# Patient Record
Sex: Female | Born: 1935
Health system: Southern US, Community
[De-identification: ages and names within clinical notes are randomized; demographics above are authoritative.]

## PROBLEM LIST (undated history)

## (undated) DIAGNOSIS — F329 Major depressive disorder, single episode, unspecified: Secondary | ICD-10-CM

## (undated) DIAGNOSIS — E079 Disorder of thyroid, unspecified: Secondary | ICD-10-CM

## (undated) DIAGNOSIS — E785 Hyperlipidemia, unspecified: Secondary | ICD-10-CM

## (undated) DIAGNOSIS — N390 Urinary tract infection, site not specified: Secondary | ICD-10-CM

## (undated) DIAGNOSIS — F32A Depression, unspecified: Secondary | ICD-10-CM

## (undated) DIAGNOSIS — I10 Essential (primary) hypertension: Secondary | ICD-10-CM

## (undated) DIAGNOSIS — A0472 Enterocolitis due to Clostridium difficile, not specified as recurrent: Secondary | ICD-10-CM

## (undated) DIAGNOSIS — M810 Age-related osteoporosis without current pathological fracture: Secondary | ICD-10-CM

## (undated) HISTORY — PX: FOOT SURGERY: SHX648

## (undated) HISTORY — PX: OTHER SURGICAL HISTORY: SHX169

## (undated) HISTORY — DX: Disorder of thyroid, unspecified: E07.9

## (undated) HISTORY — DX: Age-related osteoporosis without current pathological fracture: M81.0

## (undated) HISTORY — DX: Major depressive disorder, single episode, unspecified: F32.9

## (undated) HISTORY — DX: Depression, unspecified: F32.A

## (undated) HISTORY — DX: Essential (primary) hypertension: I10

---

## 1999-09-03 ENCOUNTER — Encounter: Admission: RE | Admit: 1999-09-03 | Discharge: 1999-09-03 | Payer: Self-pay

## 1999-09-07 ENCOUNTER — Emergency Department (HOSPITAL_COMMUNITY): Admission: EM | Admit: 1999-09-07 | Discharge: 1999-09-07 | Payer: Self-pay | Admitting: Emergency Medicine

## 1999-09-08 ENCOUNTER — Encounter: Payer: Self-pay | Admitting: Emergency Medicine

## 1999-11-26 ENCOUNTER — Encounter: Payer: Self-pay | Admitting: Neurological Surgery

## 1999-11-30 ENCOUNTER — Inpatient Hospital Stay (HOSPITAL_COMMUNITY): Admission: RE | Admit: 1999-11-30 | Discharge: 1999-12-04 | Payer: Self-pay | Admitting: Neurological Surgery

## 1999-11-30 ENCOUNTER — Encounter: Payer: Self-pay | Admitting: Neurological Surgery

## 1999-12-01 ENCOUNTER — Encounter: Payer: Self-pay | Admitting: Neurological Surgery

## 1999-12-18 ENCOUNTER — Encounter: Admission: RE | Admit: 1999-12-18 | Discharge: 1999-12-18 | Payer: Self-pay | Admitting: Neurological Surgery

## 1999-12-18 ENCOUNTER — Encounter: Payer: Self-pay | Admitting: Neurological Surgery

## 1999-12-28 ENCOUNTER — Encounter: Admission: RE | Admit: 1999-12-28 | Discharge: 2000-01-20 | Payer: Self-pay | Admitting: Orthopaedic Surgery

## 2000-02-01 ENCOUNTER — Encounter: Admission: RE | Admit: 2000-02-01 | Discharge: 2000-02-01 | Payer: Self-pay | Admitting: Neurological Surgery

## 2000-02-01 ENCOUNTER — Encounter: Payer: Self-pay | Admitting: Neurological Surgery

## 2000-03-18 ENCOUNTER — Encounter: Payer: Self-pay | Admitting: Neurological Surgery

## 2000-03-18 ENCOUNTER — Encounter: Admission: RE | Admit: 2000-03-18 | Discharge: 2000-03-18 | Payer: Self-pay | Admitting: Neurological Surgery

## 2000-03-30 ENCOUNTER — Ambulatory Visit (HOSPITAL_BASED_OUTPATIENT_CLINIC_OR_DEPARTMENT_OTHER): Admission: RE | Admit: 2000-03-30 | Discharge: 2000-03-30 | Payer: Self-pay | Admitting: Orthopedic Surgery

## 2000-04-19 ENCOUNTER — Other Ambulatory Visit: Admission: RE | Admit: 2000-04-19 | Discharge: 2000-04-19 | Payer: Self-pay | Admitting: Gynecology

## 2001-06-01 ENCOUNTER — Encounter: Admission: RE | Admit: 2001-06-01 | Discharge: 2001-06-01 | Payer: Self-pay | Admitting: Neurological Surgery

## 2001-06-01 ENCOUNTER — Encounter: Payer: Self-pay | Admitting: Neurological Surgery

## 2001-06-07 ENCOUNTER — Encounter: Admission: RE | Admit: 2001-06-07 | Discharge: 2001-06-07 | Payer: Self-pay | Admitting: Neurological Surgery

## 2001-06-07 ENCOUNTER — Encounter: Payer: Self-pay | Admitting: Neurological Surgery

## 2001-06-20 ENCOUNTER — Other Ambulatory Visit: Admission: RE | Admit: 2001-06-20 | Discharge: 2001-06-20 | Payer: Self-pay | Admitting: Gynecology

## 2001-06-23 ENCOUNTER — Encounter: Payer: Self-pay | Admitting: General Surgery

## 2001-06-23 ENCOUNTER — Encounter: Admission: RE | Admit: 2001-06-23 | Discharge: 2001-06-23 | Payer: Self-pay | Admitting: General Surgery

## 2002-04-09 ENCOUNTER — Encounter: Payer: Self-pay | Admitting: Internal Medicine

## 2002-04-09 ENCOUNTER — Encounter: Admission: RE | Admit: 2002-04-09 | Discharge: 2002-04-09 | Payer: Self-pay | Admitting: Internal Medicine

## 2002-06-14 ENCOUNTER — Ambulatory Visit (HOSPITAL_COMMUNITY): Admission: RE | Admit: 2002-06-14 | Discharge: 2002-06-14 | Payer: Self-pay | Admitting: Gastroenterology

## 2003-01-18 ENCOUNTER — Encounter: Payer: Self-pay | Admitting: Geriatric Medicine

## 2003-01-18 ENCOUNTER — Encounter: Admission: RE | Admit: 2003-01-18 | Discharge: 2003-01-18 | Payer: Self-pay | Admitting: Geriatric Medicine

## 2003-01-28 ENCOUNTER — Ambulatory Visit (HOSPITAL_COMMUNITY): Admission: RE | Admit: 2003-01-28 | Discharge: 2003-01-28 | Payer: Self-pay | Admitting: Neurology

## 2003-01-28 ENCOUNTER — Encounter: Payer: Self-pay | Admitting: Neurology

## 2003-01-30 ENCOUNTER — Other Ambulatory Visit: Admission: RE | Admit: 2003-01-30 | Discharge: 2003-01-30 | Payer: Self-pay | Admitting: Gynecology

## 2003-03-20 ENCOUNTER — Encounter: Payer: Self-pay | Admitting: Neurological Surgery

## 2003-03-20 ENCOUNTER — Ambulatory Visit (HOSPITAL_COMMUNITY): Admission: RE | Admit: 2003-03-20 | Discharge: 2003-03-20 | Payer: Self-pay | Admitting: Neurological Surgery

## 2003-04-03 ENCOUNTER — Encounter: Admission: RE | Admit: 2003-04-03 | Discharge: 2003-04-03 | Payer: Self-pay | Admitting: Neurological Surgery

## 2003-04-03 ENCOUNTER — Encounter: Payer: Self-pay | Admitting: Neurological Surgery

## 2003-06-07 ENCOUNTER — Encounter: Admission: RE | Admit: 2003-06-07 | Discharge: 2003-06-07 | Payer: Self-pay | Admitting: Geriatric Medicine

## 2003-06-07 ENCOUNTER — Encounter: Payer: Self-pay | Admitting: Geriatric Medicine

## 2003-11-05 ENCOUNTER — Encounter: Admission: RE | Admit: 2003-11-05 | Discharge: 2003-11-05 | Payer: Self-pay | Admitting: Neurological Surgery

## 2004-04-15 ENCOUNTER — Other Ambulatory Visit: Admission: RE | Admit: 2004-04-15 | Discharge: 2004-04-15 | Payer: Self-pay | Admitting: Gynecology

## 2005-10-04 ENCOUNTER — Encounter: Admission: RE | Admit: 2005-10-04 | Discharge: 2005-10-04 | Payer: Self-pay | Admitting: Neurological Surgery

## 2005-10-21 ENCOUNTER — Encounter: Admission: RE | Admit: 2005-10-21 | Discharge: 2005-10-21 | Payer: Self-pay | Admitting: Neurological Surgery

## 2006-04-19 ENCOUNTER — Encounter: Admission: RE | Admit: 2006-04-19 | Discharge: 2006-04-19 | Payer: Self-pay | Admitting: Geriatric Medicine

## 2006-09-08 ENCOUNTER — Encounter: Admission: RE | Admit: 2006-09-08 | Discharge: 2006-09-08 | Payer: Self-pay | Admitting: Neurological Surgery

## 2006-11-24 ENCOUNTER — Other Ambulatory Visit: Admission: RE | Admit: 2006-11-24 | Discharge: 2006-11-24 | Payer: Self-pay | Admitting: Gynecology

## 2007-05-17 ENCOUNTER — Encounter: Admission: RE | Admit: 2007-05-17 | Discharge: 2007-05-17 | Payer: Self-pay | Admitting: Gynecology

## 2008-01-25 ENCOUNTER — Ambulatory Visit (HOSPITAL_COMMUNITY): Admission: RE | Admit: 2008-01-25 | Discharge: 2008-01-25 | Payer: Self-pay | Admitting: Geriatric Medicine

## 2008-01-25 ENCOUNTER — Encounter (INDEPENDENT_AMBULATORY_CARE_PROVIDER_SITE_OTHER): Payer: Self-pay | Admitting: Geriatric Medicine

## 2008-01-25 ENCOUNTER — Ambulatory Visit: Payer: Self-pay | Admitting: Vascular Surgery

## 2008-05-20 ENCOUNTER — Encounter: Admission: RE | Admit: 2008-05-20 | Discharge: 2008-05-20 | Payer: Self-pay | Admitting: Gynecology

## 2008-06-11 ENCOUNTER — Encounter: Admission: RE | Admit: 2008-06-11 | Discharge: 2008-06-11 | Payer: Self-pay | Admitting: Neurological Surgery

## 2009-06-02 ENCOUNTER — Encounter: Admission: RE | Admit: 2009-06-02 | Discharge: 2009-06-02 | Payer: Self-pay | Admitting: Geriatric Medicine

## 2009-08-07 ENCOUNTER — Ambulatory Visit (HOSPITAL_COMMUNITY): Admission: RE | Admit: 2009-08-07 | Discharge: 2009-08-07 | Payer: Self-pay | Admitting: Gastroenterology

## 2009-09-19 ENCOUNTER — Encounter: Admission: RE | Admit: 2009-09-19 | Discharge: 2009-09-19 | Payer: Self-pay | Admitting: Gastroenterology

## 2010-06-15 ENCOUNTER — Encounter: Admission: RE | Admit: 2010-06-15 | Discharge: 2010-06-15 | Payer: Self-pay | Admitting: Geriatric Medicine

## 2010-09-20 ENCOUNTER — Encounter: Payer: Self-pay | Admitting: Geriatric Medicine

## 2010-09-20 ENCOUNTER — Encounter: Payer: Self-pay | Admitting: Gastroenterology

## 2011-01-15 NOTE — Discharge Summary (Signed)
[place]. [facility]  Patient:    Jacqueline Robles, Jacqueline Robles                         MRN: [id number] Adm. Date:  [id number] Disc. Date: [id number] Attending:  Jonne Ply                           Discharge Summary  ADMITTING DIAGNOSIS:  Cervical spondylosis with radiculopathy.  DISCHARGE DIAGNOSIS:  Cervical spondylosis with radiculopathy.  OPERATION:  Anterior cervical diskectomy and arthrodesis, C4-5, C5-6, and C6-7 ith allograft Synthes plate fixation.  CONDITION ON DISCHARGE:  Improving.  HOSPITAL COURSE:  The patient is a 77 year old individual who has had significant neck shoulder and right arm pain.  She was found to have spondylitic disease at  C4-5, 5-6, and 6-7 with significant foraminal encroachment at the levels involved. She had failed extensive efforts at conservative management and was advised regarding surgical treatment which was performed on November 30, 1999. Postoperatively, the patient complained of significant right shoulder pain. She was ambulated albeit slowly.  She was noted to be very dizzy and light-headed when she would ambulate.  On the third postoperative day, she was found to have a hemoglobin of 8.4.  She was advised regarding transfusion of two units of packed red cells which was performed on December 03, 1999.  She tolerated this well.  She continues to have right shoulder pain which persists.  An MRI of the right shoulder was performed which shows significant tendinitis.  Consultation with Dr. Norlene Campbell will be obtained prior to the patients discharge, if possible.  In the meantime, she is maintained on her current pain regimen which includes hydrocodone and OxyContin 10 mg p.o. b.i.d.  She will be seen in the office in three weeks time. DD:  12/03/99 TD:  12/06/99 Job: [id number]

## 2011-01-15 NOTE — Op Note (Signed)
[facility]  Patient:    Jacqueline Robles, Jacqueline Robles                         MRN: [id number] Proc. Date: 03/30/00 Adm. Date:  [id number] Disc. Date: [id number] Attending:  Georgena Spurling                           Operative Report  PREOPERATIVE DIAGNOSIS:  Right shoulder impingement syndrome with tendonosis of the rotator cuff.  POSTOPERATIVE DIAGNOSIS:  Right shoulder impingement syndrome with tendonosis of the rotator cuff.  PROCEDURE PERFORMED:  Right shoulder arthroscopy with subacromial decompression.  SURGEON:  Littie Deeds, M.D.  ANESTHESIA:  Interscalene block.  BRIEF HISTORY:  The patient is a 77 year old white female with shoulder impingement and failed conservative treatment.  After informed consent was obtained, she was taken to the operating room.  DESCRIPTION OF PROCEDURE:  The patient was taken to the operating room and laid in the supine position after being administered an interscalene block in the preoperative holding area.  The right shoulder and upper extremity was prepped and draped in the usual sterile fashion with the patient in the beach chair position.  A posterior portal was created 2 cm medial and inferior to the posterolateral corner of the acromion.  This was done with a #11 blade, blunt trocar and cannula.  A diagnostic glenohumeral arthroscopy was performed showing some mild chondromalacia, but no other abnormalities.  There was some fraying of the subscapularis tendon.  We then evacuated the glenohumeral joint and went into the subacromial space to the posterior portal.  We then made a lateral portal 2 cm lateral to the border of the acromion in line with the posterior aspect of the clavicle.  The 7 mm cannula was introduced to the subacromial space.  The 42 shaver was used and a total subacromial bursectomy was performed.  We then used the cylindrical bur to do an acromioplasty removing approximately 5-7 mm of bone starting at  the anterolateral corner converting a type 3 to a type 1 acromion.  We went over to the [facility] joint, but did not violate the [facility] joint.  We then debrided further the bursa off of the rotator cuff and went through extreme of motion to ensure that there was no rotator cuff tears.  There were not.  There did appear to be some possible partial thickness tearing at the rotator cuff interval, but not full thickness.  We then evacuated the subacromial space with fluid in the instruments.  We then infiltrated the joints with 10 cc of 1/2% Marcaine and morphine mixture.  We then placed the Zimmer pain pump through the lateral portal and closed the portals with interrupted 4-0 nylon sutures.  We then dressed the wounds with Xeroform dressings, sponges, and a standard shoulder dressing.  We then placed the patient in a sling.  The patient was taken to the recovery room in stable condition.  Complications none.  Drains none. DD:  03/30/00 TD:  04/01/00 Job: [id number]

## 2011-01-15 NOTE — Op Note (Signed)
   NAME:  Jacqueline Robles, Jacqueline Robles                            ACCOUNT NO.:  [id number]   MEDICAL RECORD NO.:  [id number]                   PATIENT TYPE:  AMB   LOCATION:  ENDO                                 FACILITY:  [facility]   PHYSICIAN:  Danise Edge, M.D.                DATE OF BIRTH:  19-Jul-1922   DATE OF PROCEDURE:  06/14/2002  DATE OF DISCHARGE:                                 OPERATIVE REPORT   PROCEDURE:  Screening colonoscopy.   INDICATIONS:  The patient is a 77 year old female born [birth year].  The  patient is scheduled to undergo her first screening colonoscopy with  polypectomy to prevent colon cancer.  I discussed with the patient the  complications associated with colonoscopy and polypectomy, including a 15  per thousand risk of bleeding and four per thousand risk of colon  perforation requiring surgical repair.  The patient has signed the operative  permit.   ENDOSCOPIST:  Danise Edge, M.D.   PREMEDICATION:  Versed 5 mg, fentanyl 50 mcg.   ENDOSCOPE:  Olympus pediatric colonoscope.   DESCRIPTION OF PROCEDURE:  After obtaining informed consent, the patient was  placed in the left lateral decubitus position.  I administered intravenous  fentanyl and intravenous Versed to achieve conscious sedation for the  procedure.  The patient's blood pressure, oxygen saturation, and cardiac  rhythm were monitored throughout the procedure and documented in the medical  record.   Anal inspection was normal.  Digital rectal exam was normal.  The Olympus  pediatric video colonoscope was introduced into the rectum and advanced to  the cecum.  The patient has a very tortuous left colon, prone to forming  colonic loops, making colonoscopic exam difficult.   Rectum normal.   Sigmoid colon and descending colon normal.   Splenic flexure normal.   Transverse colon normal.   Hepatic flexure normal.   Ascending colon normal.   Cecum and ileocecal valve normal.    ASSESSMENT:  Left  colonic diverticulosis; otherwise normal  proctocolonoscopic exam to the cecum.  No endoscopic evidence for the  presence of colorectal neoplasia.                                                Danise Edge, M.D.    MJ/MEDQ  D:  06/14/2002  T:  06/15/2002  Job:  [id number]   cc:   Hal T. Stoneking, M.D.  [address] [place], [place] [address]  Fax: [phone]

## 2011-01-15 NOTE — Op Note (Signed)
[place]. [facility]  Patient:    Jacqueline Robles, Jacqueline Robles                         MRN: [id number] Proc. Date: 11/30/99 Adm. Date:  [id number] Attending:  Jonne Ply                           Operative Report  PREOPERATIVE DIAGNOSIS:  Cervical spondylosis with radiculopathy, C4-5, C5-6 and C6-7.  POSTOPERATIVE DIAGNOSIS:  Cervical spondylosis with radiculopathy, C4-5, C5-6 and C6-7.  OPERATION PERFORMED:  Anterior cervical diskectomy and arthrodesis C4-5, C5-6 and C6-7.  SURGEON:  Stefani Dama, M.D.  FIRST ASSISTANT:  Alanson Aly. Roxan Hockey, M.D.  ANESTHESIA:  General endotracheal.  INDICATIONS FOR PROCEDURE:  The patient is a 77 year old individual who has had  neck, shoulder and arm pain.  She has been troubled with cervical radiculopathy for quite some time, has failed extensive efforts at conservative management.  She as advised regarding surgery.  She is brought to the operating room now.  DESCRIPTION OF PROCEDURE:  The patient was brought to the operating room and placed on the table in supine position.  After the smooth induction of general endotracheal anesthesia, she was placed in 5 pounds of halter traction.  The neck was shaved, prepped with DuraPrep and draped in sterile fashion.  A transverse incision was made in the neck and carried down through the platysma.  The plane  between the sternocleidomastoid and the strap muscles was dissected bluntly until the prevertebral space was reached.  The first identifiable disk space was noted to be that of C5-6 on the radiograph.  The dissection was then carried cephalad and inferiorly and the longus coli muscle was stripped so that a Caspar retractor could be placed into the wound.  The diskectomy was then performed at C4-5 and as the  disk was removed, a large quantity of significant spondylitic ridging was encountered from the inferior margin of body of C4 and the lateral recesses  at he uncinate processes.  These were taken down with a Midas Rex drill and an A-2 bur. The dissection was then carried out laterally and the uncinate process spur was  removed completely.  Hemostasis from epidural bleeding was encountered and contained with bipolar cautery and some pledgets of Gelfoam soaked in thrombin which were later removed.  In the end the complete epidural space across the ventral aspect of the dura was decompressed.  A 6 mm round graft was placed into the interspace.  The graft was packed with some of the uncinate process bone. Attention was turned to C5-6 where a similar condition was encountered; however, here the spurs were not quite as large.  They were more matured.  The disk space was very narrowed.  At C6-7 a similar condition was also encountered.  Once these areas were each decompressed and then secured with a round 6 mm fibular graft and similarly packed with bone.  Traction was removed and the neck was placed in slight flexion.  Then a 54 mm Synthes plate was affixed with eight locking 4 x 14 mm screws.  The screws were secured to the ventral aspect of the vertebral body and locked in position.  Localizing radiograph identified good position of the screws though the C5 screws were projecting superiorly.  The area was copiously irrigated with antibiotic irrigating solution.  Then the platysma was closed with 2-0 Vicryl in  interrupted fashion and 2-0 Vicryl was used subcuticularly.  Dermabond was placed on the skin.  The patient tolerated the procedure well and was returned o the recovery room in stable condition. DD:  11/30/99 TD:  12/01/99 Job: [id number]

## 2011-01-15 NOTE — H&P (Signed)
[place]. [facility]  Patient:    Jacqueline Robles, Jacqueline Robles                         MRN: [id number] Adm. Date:  [id number] Attending:  Jonne Ply                         History and Physical  ADMISSION DIAGNOSIS:  Cervical spondylosis C4-5, C5-6, and C6-7.  HISTORY OF PRESENT ILLNESS:  Ms. Freeman is a 77 year old individual who has previously had significant degenerative disk disease of the lumbar spine, having undergone surgical decompression and stabilization back in February 1995.  She ad presented earlier this year with complaints of pain in the neck, shoulder, and right arm with numbness into the arm and forearm on the right side.  She has had difficulties with lumbar spondylosis in the past.  The patient had an MRI of her cervical spine in January of this year which demonstrated significant spondylitic disease at multiple levels with C4-5 showing an old herniated nucleus pulposus ith spondylitic overgrowth on the right side.  On the left side at C5-6 and C6-7, she also had some spondylitic narrowing, but the patient had no significant symptoms of left upper extremity syndrome.  She notes that fine movements in her right hand  been deteriorating for some time with her ability to write clearly and legibly being significantly impaired.  PAST MEDICAL HISTORY:  She has some hypertension and hypothyroidism.  CURRENT MEDICATIONS:  Micardis 80 mg a day, Plavix, hydrochlorothiazide, Synthroid, Premarin, Allegra, Prilosec, Pravachol, Diazepam, and Vicodin.  She also uses ibuprofen and had been placed on OxyContin for chronic pain.  ALLERGIES:  She notes no significant medical allergies.  SOCIAL HISTORY:  She is widowed.  She lives with her son who also has some medical problems.  She maintains her own household, however.  REVIEW OF SYSTEMS:  Notable for items in the history of present illness in addition to leg and foot pain with the leg pain having  recently been treated surgically.   PHYSICAL EXAMINATION:  GENERAL:  She is an alert, oriented and cooperative individual in no significant distress.  NEUROLOGIC:  Cranial nerve examination:  Pupils are 4 mm and briskly reactive to light and accommodation.  Extraocular movements are full.  Face is symmetric to  grimace.  Tongue and uvula are in the midline.  Sclerae and conjunctivae are normal.  The neck has no masses.  No bruits are heard.  Range of motion reveals  that she can turn 60 degrees to the left and 60 degrees to the right.  She extends and flexes 30 degrees, nearing touching her chin to her chest.  Spurlings maneuver is negative.  Axial compression does not reproduce any overt pain.  Motor strength in the upper extremities reveals the deltoids have 5/5 strength bilaterally. Biceps strength on the right is weak compared to the left at 4/5.  Grip strength and intrinsic strength seem normal with tone and bulk being fairly normal for the patients age and stature.  Deep tendon reflexes in the biceps on the right are +, and 2+ in the biceps on the left, absent in both triceps, 2+ in the patellae, 1+ in the Achilles. Babinskis are downgoing.  Sensation is intact distally to pin and  vibration.  No masses are palpable on the neck or axilla.  There is some tenderness to palpation in the supraclavicular fossa on the  right side.  No bruits are heard in the neck.  Gait is slow.  The patient has a little difficulty lifting her left lower extremity.  LUNGS:  Clear to auscultation.  HEART:  Regular rate and rhythm.  ABDOMEN:  Soft.  Bowel sounds are positive.  No masses are palpable.  EXTREMITIES:  No cyanosis, clubbing, or edema.  Peripheral pulses are intact.  IMPRESSION:  The patient has significant spondylitic disease at C4-5, C5-6, and at C6-7.  She is now being admitted to undergo surgical decompression at three levels involved.  Previously discussed surgical  intervention.  Because of the extensiveness of the cervical spondylosis at C5-6 and C6-7, I believe that these two additional levels should be decompressed and arthrodesed. DD:  11/30/99 TD:  11/30/99 Job: [id number]

## 2011-09-08 DIAGNOSIS — Z79899 Other long term (current) drug therapy: Secondary | ICD-10-CM | POA: Diagnosis not present

## 2011-09-08 DIAGNOSIS — G479 Sleep disorder, unspecified: Secondary | ICD-10-CM | POA: Diagnosis not present

## 2011-09-08 DIAGNOSIS — E782 Mixed hyperlipidemia: Secondary | ICD-10-CM | POA: Diagnosis not present

## 2011-09-08 DIAGNOSIS — J309 Allergic rhinitis, unspecified: Secondary | ICD-10-CM | POA: Diagnosis not present

## 2011-09-08 DIAGNOSIS — I129 Hypertensive chronic kidney disease with stage 1 through stage 4 chronic kidney disease, or unspecified chronic kidney disease: Secondary | ICD-10-CM | POA: Diagnosis not present

## 2011-11-08 DIAGNOSIS — H35039 Hypertensive retinopathy, unspecified eye: Secondary | ICD-10-CM | POA: Diagnosis not present

## 2011-11-08 DIAGNOSIS — H35349 Macular cyst, hole, or pseudohole, unspecified eye: Secondary | ICD-10-CM | POA: Diagnosis not present

## 2011-11-08 DIAGNOSIS — H353190 Nonexudative age-related macular degeneration, unspecified eye, stage unspecified: Secondary | ICD-10-CM | POA: Diagnosis not present

## 2011-11-08 DIAGNOSIS — H35369 Drusen (degenerative) of macula, unspecified eye: Secondary | ICD-10-CM | POA: Diagnosis not present

## 2011-12-10 DIAGNOSIS — G479 Sleep disorder, unspecified: Secondary | ICD-10-CM | POA: Diagnosis not present

## 2011-12-10 DIAGNOSIS — R269 Unspecified abnormalities of gait and mobility: Secondary | ICD-10-CM | POA: Diagnosis not present

## 2011-12-10 DIAGNOSIS — I129 Hypertensive chronic kidney disease with stage 1 through stage 4 chronic kidney disease, or unspecified chronic kidney disease: Secondary | ICD-10-CM | POA: Diagnosis not present

## 2011-12-14 ENCOUNTER — Encounter: Payer: Self-pay | Admitting: Physical Therapy

## 2011-12-17 ENCOUNTER — Encounter: Payer: Self-pay | Admitting: Physical Therapy

## 2012-05-08 DIAGNOSIS — H353190 Nonexudative age-related macular degeneration, unspecified eye, stage unspecified: Secondary | ICD-10-CM | POA: Diagnosis not present

## 2012-05-08 DIAGNOSIS — H35349 Macular cyst, hole, or pseudohole, unspecified eye: Secondary | ICD-10-CM | POA: Diagnosis not present

## 2012-05-08 DIAGNOSIS — H35369 Drusen (degenerative) of macula, unspecified eye: Secondary | ICD-10-CM | POA: Diagnosis not present

## 2012-06-14 DIAGNOSIS — S42023A Displaced fracture of shaft of unspecified clavicle, initial encounter for closed fracture: Secondary | ICD-10-CM | POA: Diagnosis not present

## 2012-06-23 DIAGNOSIS — D500 Iron deficiency anemia secondary to blood loss (chronic): Secondary | ICD-10-CM | POA: Diagnosis not present

## 2012-06-23 DIAGNOSIS — E782 Mixed hyperlipidemia: Secondary | ICD-10-CM | POA: Diagnosis not present

## 2012-06-23 DIAGNOSIS — E039 Hypothyroidism, unspecified: Secondary | ICD-10-CM | POA: Diagnosis not present

## 2012-06-23 DIAGNOSIS — Z0000 Encounter for general adult medical examination without abnormal findings: Secondary | ICD-10-CM | POA: Diagnosis not present

## 2012-06-23 DIAGNOSIS — Z79899 Other long term (current) drug therapy: Secondary | ICD-10-CM | POA: Diagnosis not present

## 2012-06-23 DIAGNOSIS — J309 Allergic rhinitis, unspecified: Secondary | ICD-10-CM | POA: Diagnosis not present

## 2012-06-23 DIAGNOSIS — Z1331 Encounter for screening for depression: Secondary | ICD-10-CM | POA: Diagnosis not present

## 2012-07-05 DIAGNOSIS — L259 Unspecified contact dermatitis, unspecified cause: Secondary | ICD-10-CM | POA: Diagnosis not present

## 2012-07-12 DIAGNOSIS — S42023A Displaced fracture of shaft of unspecified clavicle, initial encounter for closed fracture: Secondary | ICD-10-CM | POA: Diagnosis not present

## 2012-07-12 DIAGNOSIS — M25519 Pain in unspecified shoulder: Secondary | ICD-10-CM | POA: Diagnosis not present

## 2012-08-03 DIAGNOSIS — G479 Sleep disorder, unspecified: Secondary | ICD-10-CM | POA: Diagnosis not present

## 2012-08-03 DIAGNOSIS — I359 Nonrheumatic aortic valve disorder, unspecified: Secondary | ICD-10-CM | POA: Diagnosis not present

## 2012-08-03 DIAGNOSIS — I129 Hypertensive chronic kidney disease with stage 1 through stage 4 chronic kidney disease, or unspecified chronic kidney disease: Secondary | ICD-10-CM | POA: Diagnosis not present

## 2012-08-09 DIAGNOSIS — S42023A Displaced fracture of shaft of unspecified clavicle, initial encounter for closed fracture: Secondary | ICD-10-CM | POA: Diagnosis not present

## 2012-08-09 DIAGNOSIS — M25519 Pain in unspecified shoulder: Secondary | ICD-10-CM | POA: Diagnosis not present

## 2012-08-14 DIAGNOSIS — I359 Nonrheumatic aortic valve disorder, unspecified: Secondary | ICD-10-CM | POA: Diagnosis not present

## 2012-08-25 DIAGNOSIS — D509 Iron deficiency anemia, unspecified: Secondary | ICD-10-CM | POA: Diagnosis not present

## 2012-09-14 DIAGNOSIS — D509 Iron deficiency anemia, unspecified: Secondary | ICD-10-CM | POA: Diagnosis not present

## 2012-09-29 DIAGNOSIS — M25519 Pain in unspecified shoulder: Secondary | ICD-10-CM | POA: Diagnosis not present

## 2012-09-29 DIAGNOSIS — M76899 Other specified enthesopathies of unspecified lower limb, excluding foot: Secondary | ICD-10-CM | POA: Diagnosis not present

## 2012-11-30 DIAGNOSIS — M25559 Pain in unspecified hip: Secondary | ICD-10-CM | POA: Diagnosis not present

## 2012-11-30 DIAGNOSIS — I359 Nonrheumatic aortic valve disorder, unspecified: Secondary | ICD-10-CM | POA: Diagnosis not present

## 2012-11-30 DIAGNOSIS — I129 Hypertensive chronic kidney disease with stage 1 through stage 4 chronic kidney disease, or unspecified chronic kidney disease: Secondary | ICD-10-CM | POA: Diagnosis not present

## 2012-11-30 DIAGNOSIS — Z79899 Other long term (current) drug therapy: Secondary | ICD-10-CM | POA: Diagnosis not present

## 2012-11-30 DIAGNOSIS — E782 Mixed hyperlipidemia: Secondary | ICD-10-CM | POA: Diagnosis not present

## 2012-12-11 DIAGNOSIS — H35039 Hypertensive retinopathy, unspecified eye: Secondary | ICD-10-CM | POA: Diagnosis not present

## 2012-12-11 DIAGNOSIS — H35349 Macular cyst, hole, or pseudohole, unspecified eye: Secondary | ICD-10-CM | POA: Diagnosis not present

## 2012-12-11 DIAGNOSIS — H353190 Nonexudative age-related macular degeneration, unspecified eye, stage unspecified: Secondary | ICD-10-CM | POA: Diagnosis not present

## 2012-12-11 DIAGNOSIS — H35369 Drusen (degenerative) of macula, unspecified eye: Secondary | ICD-10-CM | POA: Diagnosis not present

## 2013-01-15 ENCOUNTER — Other Ambulatory Visit: Payer: Self-pay | Admitting: Podiatry

## 2013-01-15 DIAGNOSIS — D485 Neoplasm of uncertain behavior of skin: Secondary | ICD-10-CM | POA: Diagnosis not present

## 2013-01-15 DIAGNOSIS — L980 Pyogenic granuloma: Secondary | ICD-10-CM | POA: Diagnosis not present

## 2013-01-15 DIAGNOSIS — M79609 Pain in unspecified limb: Secondary | ICD-10-CM | POA: Diagnosis not present

## 2013-01-17 DIAGNOSIS — Z961 Presence of intraocular lens: Secondary | ICD-10-CM | POA: Diagnosis not present

## 2013-01-17 DIAGNOSIS — H3530 Unspecified macular degeneration: Secondary | ICD-10-CM | POA: Diagnosis not present

## 2013-01-25 DIAGNOSIS — D2370 Other benign neoplasm of skin of unspecified lower limb, including hip: Secondary | ICD-10-CM | POA: Diagnosis not present

## 2013-03-26 DIAGNOSIS — L259 Unspecified contact dermatitis, unspecified cause: Secondary | ICD-10-CM | POA: Diagnosis not present

## 2013-05-17 DIAGNOSIS — R233 Spontaneous ecchymoses: Secondary | ICD-10-CM | POA: Diagnosis not present

## 2013-05-17 DIAGNOSIS — R350 Frequency of micturition: Secondary | ICD-10-CM | POA: Diagnosis not present

## 2013-06-13 DIAGNOSIS — L259 Unspecified contact dermatitis, unspecified cause: Secondary | ICD-10-CM | POA: Diagnosis not present

## 2013-06-25 ENCOUNTER — Ambulatory Visit
Admission: RE | Admit: 2013-06-25 | Discharge: 2013-06-25 | Disposition: A | Discharge status: Home / Self Care | Payer: Medicare Other | Source: Ambulatory Visit | Attending: Geriatric Medicine | Admitting: Geriatric Medicine

## 2013-06-25 ENCOUNTER — Other Ambulatory Visit: Payer: Self-pay | Admitting: Geriatric Medicine

## 2013-06-25 DIAGNOSIS — Z23 Encounter for immunization: Secondary | ICD-10-CM | POA: Diagnosis not present

## 2013-06-25 DIAGNOSIS — M549 Dorsalgia, unspecified: Secondary | ICD-10-CM

## 2013-06-25 DIAGNOSIS — Z79899 Other long term (current) drug therapy: Secondary | ICD-10-CM | POA: Diagnosis not present

## 2013-06-25 DIAGNOSIS — G479 Sleep disorder, unspecified: Secondary | ICD-10-CM | POA: Diagnosis not present

## 2013-06-25 DIAGNOSIS — Z0000 Encounter for general adult medical examination without abnormal findings: Secondary | ICD-10-CM | POA: Diagnosis not present

## 2013-06-25 DIAGNOSIS — R350 Frequency of micturition: Secondary | ICD-10-CM | POA: Diagnosis not present

## 2013-06-25 DIAGNOSIS — M539 Dorsopathy, unspecified: Secondary | ICD-10-CM | POA: Diagnosis not present

## 2013-06-25 DIAGNOSIS — Z1331 Encounter for screening for depression: Secondary | ICD-10-CM | POA: Diagnosis not present

## 2013-06-26 DIAGNOSIS — R350 Frequency of micturition: Secondary | ICD-10-CM | POA: Diagnosis not present

## 2013-09-10 DIAGNOSIS — H1045 Other chronic allergic conjunctivitis: Secondary | ICD-10-CM | POA: Diagnosis not present

## 2013-09-10 DIAGNOSIS — H04129 Dry eye syndrome of unspecified lacrimal gland: Secondary | ICD-10-CM | POA: Diagnosis not present

## 2013-09-10 DIAGNOSIS — Z961 Presence of intraocular lens: Secondary | ICD-10-CM | POA: Diagnosis not present

## 2013-09-10 DIAGNOSIS — H01139 Eczematous dermatitis of unspecified eye, unspecified eyelid: Secondary | ICD-10-CM | POA: Diagnosis not present

## 2013-09-21 DIAGNOSIS — I129 Hypertensive chronic kidney disease with stage 1 through stage 4 chronic kidney disease, or unspecified chronic kidney disease: Secondary | ICD-10-CM | POA: Diagnosis not present

## 2013-09-21 DIAGNOSIS — J309 Allergic rhinitis, unspecified: Secondary | ICD-10-CM | POA: Diagnosis not present

## 2013-09-21 DIAGNOSIS — N183 Chronic kidney disease, stage 3 unspecified: Secondary | ICD-10-CM | POA: Diagnosis not present

## 2013-09-21 DIAGNOSIS — E039 Hypothyroidism, unspecified: Secondary | ICD-10-CM | POA: Diagnosis not present

## 2013-10-05 DIAGNOSIS — J309 Allergic rhinitis, unspecified: Secondary | ICD-10-CM | POA: Diagnosis not present

## 2013-10-05 DIAGNOSIS — H1045 Other chronic allergic conjunctivitis: Secondary | ICD-10-CM | POA: Diagnosis not present

## 2013-10-05 DIAGNOSIS — R21 Rash and other nonspecific skin eruption: Secondary | ICD-10-CM | POA: Diagnosis not present

## 2013-12-07 DIAGNOSIS — R269 Unspecified abnormalities of gait and mobility: Secondary | ICD-10-CM | POA: Diagnosis not present

## 2013-12-07 DIAGNOSIS — Z79899 Other long term (current) drug therapy: Secondary | ICD-10-CM | POA: Diagnosis not present

## 2013-12-07 DIAGNOSIS — N183 Chronic kidney disease, stage 3 unspecified: Secondary | ICD-10-CM | POA: Diagnosis not present

## 2013-12-07 DIAGNOSIS — M549 Dorsalgia, unspecified: Secondary | ICD-10-CM | POA: Diagnosis not present

## 2013-12-07 DIAGNOSIS — I129 Hypertensive chronic kidney disease with stage 1 through stage 4 chronic kidney disease, or unspecified chronic kidney disease: Secondary | ICD-10-CM | POA: Diagnosis not present

## 2014-04-10 DIAGNOSIS — E7800 Pure hypercholesterolemia, unspecified: Secondary | ICD-10-CM | POA: Diagnosis not present

## 2014-04-10 DIAGNOSIS — Z79899 Other long term (current) drug therapy: Secondary | ICD-10-CM | POA: Diagnosis not present

## 2014-04-10 DIAGNOSIS — I129 Hypertensive chronic kidney disease with stage 1 through stage 4 chronic kidney disease, or unspecified chronic kidney disease: Secondary | ICD-10-CM | POA: Diagnosis not present

## 2014-04-10 DIAGNOSIS — E039 Hypothyroidism, unspecified: Secondary | ICD-10-CM | POA: Diagnosis not present

## 2014-04-10 DIAGNOSIS — N183 Chronic kidney disease, stage 3 unspecified: Secondary | ICD-10-CM | POA: Diagnosis not present

## 2014-06-05 DIAGNOSIS — Z79899 Other long term (current) drug therapy: Secondary | ICD-10-CM | POA: Diagnosis not present

## 2014-07-12 DIAGNOSIS — Z23 Encounter for immunization: Secondary | ICD-10-CM | POA: Diagnosis not present

## 2014-07-12 DIAGNOSIS — M7071 Other bursitis of hip, right hip: Secondary | ICD-10-CM | POA: Diagnosis not present

## 2014-07-12 DIAGNOSIS — F325 Major depressive disorder, single episode, in full remission: Secondary | ICD-10-CM | POA: Diagnosis not present

## 2014-07-12 DIAGNOSIS — N183 Chronic kidney disease, stage 3 (moderate): Secondary | ICD-10-CM | POA: Diagnosis not present

## 2014-07-12 DIAGNOSIS — I129 Hypertensive chronic kidney disease with stage 1 through stage 4 chronic kidney disease, or unspecified chronic kidney disease: Secondary | ICD-10-CM | POA: Diagnosis not present

## 2014-07-12 DIAGNOSIS — I351 Nonrheumatic aortic (valve) insufficiency: Secondary | ICD-10-CM | POA: Diagnosis not present

## 2014-07-12 DIAGNOSIS — Z0000 Encounter for general adult medical examination without abnormal findings: Secondary | ICD-10-CM | POA: Diagnosis not present

## 2014-07-12 DIAGNOSIS — Z1389 Encounter for screening for other disorder: Secondary | ICD-10-CM | POA: Diagnosis not present

## 2014-07-12 DIAGNOSIS — E780 Pure hypercholesterolemia: Secondary | ICD-10-CM | POA: Diagnosis not present

## 2014-07-15 DIAGNOSIS — M7061 Trochanteric bursitis, right hip: Secondary | ICD-10-CM | POA: Diagnosis not present

## 2014-07-22 ENCOUNTER — Other Ambulatory Visit (HOSPITAL_COMMUNITY): Payer: Self-pay | Admitting: Radiology

## 2014-07-22 ENCOUNTER — Ambulatory Visit (HOSPITAL_COMMUNITY): Payer: Medicare Other | Attending: Cardiology | Admitting: Radiology

## 2014-07-22 DIAGNOSIS — I351 Nonrheumatic aortic (valve) insufficiency: Secondary | ICD-10-CM

## 2014-07-22 DIAGNOSIS — I10 Essential (primary) hypertension: Secondary | ICD-10-CM | POA: Diagnosis not present

## 2014-07-22 DIAGNOSIS — E785 Hyperlipidemia, unspecified: Secondary | ICD-10-CM | POA: Diagnosis not present

## 2014-07-22 NOTE — Progress Notes (Signed)
Echocardiogram performed.  

## 2014-08-26 DIAGNOSIS — Q253 Supravalvular aortic stenosis: Secondary | ICD-10-CM | POA: Diagnosis not present

## 2014-10-23 DIAGNOSIS — M533 Sacrococcygeal disorders, not elsewhere classified: Secondary | ICD-10-CM | POA: Diagnosis not present

## 2014-10-31 ENCOUNTER — Encounter (HOSPITAL_COMMUNITY): Payer: Self-pay | Admitting: *Deleted

## 2014-10-31 ENCOUNTER — Emergency Department (HOSPITAL_COMMUNITY): Payer: Medicare Other

## 2014-10-31 ENCOUNTER — Inpatient Hospital Stay (HOSPITAL_COMMUNITY)
Admission: EM | Admit: 2014-10-31 | Discharge: 2014-11-05 | DRG: 543 | Disposition: A | Discharge status: Skilled Nursing Facility | Payer: Medicare Other | Attending: Internal Medicine | Admitting: Internal Medicine

## 2014-10-31 DIAGNOSIS — I350 Nonrheumatic aortic (valve) stenosis: Secondary | ICD-10-CM

## 2014-10-31 DIAGNOSIS — Z66 Do not resuscitate: Secondary | ICD-10-CM | POA: Diagnosis present

## 2014-10-31 DIAGNOSIS — D649 Anemia, unspecified: Secondary | ICD-10-CM | POA: Diagnosis present

## 2014-10-31 DIAGNOSIS — Z79899 Other long term (current) drug therapy: Secondary | ICD-10-CM | POA: Diagnosis not present

## 2014-10-31 DIAGNOSIS — S3219XA Other fracture of sacrum, initial encounter for closed fracture: Secondary | ICD-10-CM | POA: Diagnosis not present

## 2014-10-31 DIAGNOSIS — A047 Enterocolitis due to Clostridium difficile: Secondary | ICD-10-CM | POA: Diagnosis not present

## 2014-10-31 DIAGNOSIS — M8448XA Pathological fracture, other site, initial encounter for fracture: Secondary | ICD-10-CM | POA: Diagnosis present

## 2014-10-31 DIAGNOSIS — M549 Dorsalgia, unspecified: Secondary | ICD-10-CM | POA: Diagnosis not present

## 2014-10-31 DIAGNOSIS — N390 Urinary tract infection, site not specified: Secondary | ICD-10-CM | POA: Diagnosis not present

## 2014-10-31 DIAGNOSIS — M4848XD Fatigue fracture of vertebra, sacral and sacrococcygeal region, subsequent encounter for fracture with routine healing: Secondary | ICD-10-CM | POA: Diagnosis not present

## 2014-10-31 DIAGNOSIS — E785 Hyperlipidemia, unspecified: Secondary | ICD-10-CM | POA: Diagnosis not present

## 2014-10-31 DIAGNOSIS — W19XXXA Unspecified fall, initial encounter: Secondary | ICD-10-CM

## 2014-10-31 DIAGNOSIS — M810 Age-related osteoporosis without current pathological fracture: Secondary | ICD-10-CM | POA: Diagnosis present

## 2014-10-31 DIAGNOSIS — S79912A Unspecified injury of left hip, initial encounter: Secondary | ICD-10-CM | POA: Diagnosis not present

## 2014-10-31 DIAGNOSIS — Z981 Arthrodesis status: Secondary | ICD-10-CM | POA: Diagnosis not present

## 2014-10-31 DIAGNOSIS — F419 Anxiety disorder, unspecified: Secondary | ICD-10-CM | POA: Diagnosis not present

## 2014-10-31 DIAGNOSIS — J90 Pleural effusion, not elsewhere classified: Secondary | ICD-10-CM | POA: Diagnosis not present

## 2014-10-31 DIAGNOSIS — M6281 Muscle weakness (generalized): Secondary | ICD-10-CM | POA: Diagnosis not present

## 2014-10-31 DIAGNOSIS — R488 Other symbolic dysfunctions: Secondary | ICD-10-CM | POA: Diagnosis not present

## 2014-10-31 DIAGNOSIS — R509 Fever, unspecified: Secondary | ICD-10-CM | POA: Diagnosis not present

## 2014-10-31 DIAGNOSIS — M25552 Pain in left hip: Secondary | ICD-10-CM | POA: Diagnosis not present

## 2014-10-31 DIAGNOSIS — E039 Hypothyroidism, unspecified: Secondary | ICD-10-CM | POA: Diagnosis present

## 2014-10-31 DIAGNOSIS — M545 Low back pain: Secondary | ICD-10-CM | POA: Diagnosis not present

## 2014-10-31 DIAGNOSIS — S3992XA Unspecified injury of lower back, initial encounter: Secondary | ICD-10-CM | POA: Diagnosis not present

## 2014-10-31 DIAGNOSIS — H3530 Unspecified macular degeneration: Secondary | ICD-10-CM | POA: Diagnosis present

## 2014-10-31 DIAGNOSIS — S329XXA Fracture of unspecified parts of lumbosacral spine and pelvis, initial encounter for closed fracture: Secondary | ICD-10-CM | POA: Diagnosis not present

## 2014-10-31 DIAGNOSIS — S0990XA Unspecified injury of head, initial encounter: Secondary | ICD-10-CM | POA: Diagnosis not present

## 2014-10-31 DIAGNOSIS — M8448XS Pathological fracture, other site, sequela: Secondary | ICD-10-CM | POA: Diagnosis not present

## 2014-10-31 DIAGNOSIS — Z9181 History of falling: Secondary | ICD-10-CM | POA: Diagnosis not present

## 2014-10-31 DIAGNOSIS — A0472 Enterocolitis due to Clostridium difficile, not specified as recurrent: Secondary | ICD-10-CM

## 2014-10-31 DIAGNOSIS — I10 Essential (primary) hypertension: Secondary | ICD-10-CM | POA: Diagnosis not present

## 2014-10-31 DIAGNOSIS — E038 Other specified hypothyroidism: Secondary | ICD-10-CM | POA: Diagnosis not present

## 2014-10-31 DIAGNOSIS — S3210XK Unspecified fracture of sacrum, subsequent encounter for fracture with nonunion: Secondary | ICD-10-CM | POA: Diagnosis not present

## 2014-10-31 DIAGNOSIS — R269 Unspecified abnormalities of gait and mobility: Secondary | ICD-10-CM | POA: Diagnosis not present

## 2014-10-31 DIAGNOSIS — B999 Unspecified infectious disease: Secondary | ICD-10-CM | POA: Diagnosis not present

## 2014-10-31 DIAGNOSIS — S3210XA Unspecified fracture of sacrum, initial encounter for closed fracture: Secondary | ICD-10-CM | POA: Diagnosis present

## 2014-10-31 HISTORY — DX: Hyperlipidemia, unspecified: E78.5

## 2014-10-31 LAB — URINALYSIS, ROUTINE W REFLEX MICROSCOPIC
Bilirubin Urine: NEGATIVE
Glucose, UA: NEGATIVE mg/dL
Ketones, ur: NEGATIVE mg/dL
NITRITE: POSITIVE — AB
Protein, ur: NEGATIVE mg/dL
SPECIFIC GRAVITY, URINE: 1.015 (ref 1.005–1.030)
UROBILINOGEN UA: 0.2 mg/dL (ref 0.0–1.0)
pH: 7 (ref 5.0–8.0)

## 2014-10-31 LAB — HEPATIC FUNCTION PANEL
ALBUMIN: 3.1 g/dL — AB (ref 3.5–5.2)
ALT: 13 U/L (ref 0–35)
AST: 21 U/L (ref 0–37)
Alkaline Phosphatase: 60 U/L (ref 39–117)
BILIRUBIN INDIRECT: 0.6 mg/dL (ref 0.3–0.9)
BILIRUBIN TOTAL: 0.7 mg/dL (ref 0.3–1.2)
Bilirubin, Direct: 0.1 mg/dL (ref 0.0–0.5)
Total Protein: 6 g/dL (ref 6.0–8.3)

## 2014-10-31 LAB — CBC WITH DIFFERENTIAL/PLATELET
BASOS PCT: 1 % (ref 0–1)
Basophils Absolute: 0 10*3/uL (ref 0.0–0.1)
EOS ABS: 0.2 10*3/uL (ref 0.0–0.7)
EOS PCT: 4 % (ref 0–5)
HCT: 31.7 % — ABNORMAL LOW (ref 36.0–46.0)
HEMOGLOBIN: 10.3 g/dL — AB (ref 12.0–15.0)
Lymphocytes Relative: 22 % (ref 12–46)
Lymphs Abs: 1.3 10*3/uL (ref 0.7–4.0)
MCH: 31 pg (ref 26.0–34.0)
MCHC: 32.5 g/dL (ref 30.0–36.0)
MCV: 95.5 fL (ref 78.0–100.0)
MONO ABS: 0.6 10*3/uL (ref 0.1–1.0)
Monocytes Relative: 11 % (ref 3–12)
NEUTROS PCT: 62 % (ref 43–77)
Neutro Abs: 3.7 10*3/uL (ref 1.7–7.7)
Platelets: 156 10*3/uL (ref 150–400)
RBC: 3.32 MIL/uL — ABNORMAL LOW (ref 3.87–5.11)
RDW: 13.3 % (ref 11.5–15.5)
WBC: 5.9 10*3/uL (ref 4.0–10.5)

## 2014-10-31 LAB — BASIC METABOLIC PANEL
ANION GAP: 6 (ref 5–15)
BUN: 18 mg/dL (ref 6–23)
CALCIUM: 8.7 mg/dL (ref 8.4–10.5)
CHLORIDE: 106 mmol/L (ref 96–112)
CO2: 26 mmol/L (ref 19–32)
CREATININE: 0.93 mg/dL (ref 0.50–1.10)
GFR calc non Af Amer: 53 mL/min — ABNORMAL LOW (ref >90)
GFR, EST AFRICAN AMERICAN: 61 mL/min — AB (ref >90)
GLUCOSE: 86 mg/dL (ref 70–99)
POTASSIUM: 4.3 mmol/L (ref 3.5–5.1)
SODIUM: 138 mmol/L (ref 135–145)

## 2014-10-31 LAB — URINE MICROSCOPIC-ADD ON

## 2014-10-31 LAB — APTT: aPTT: 37 seconds (ref 24–37)

## 2014-10-31 LAB — PROTIME-INR
INR: 1.1 (ref 0.00–1.49)
PROTHROMBIN TIME: 14.3 s (ref 11.6–15.2)

## 2014-10-31 MED ORDER — MORPHINE SULFATE 2 MG/ML IJ SOLN
2.0000 mg | Freq: Once | INTRAMUSCULAR | Status: AC
Start: 2014-10-31 — End: 2014-11-01
  Administered 2014-11-01: 2 mg via INTRAVENOUS
  Filled 2014-10-31: qty 1

## 2014-10-31 MED ORDER — LIDOCAINE HCL (PF) 1 % IJ SOLN
INTRAMUSCULAR | Status: AC
Start: 2014-10-31 — End: 2014-10-31
  Administered 2014-10-31: 22:00:00
  Filled 2014-10-31: qty 5

## 2014-10-31 MED ORDER — OXYCODONE-ACETAMINOPHEN 5-325 MG PO TABS
1.0000 | ORAL_TABLET | Freq: Once | ORAL | Status: AC
  Administered 2014-10-31: 1 via ORAL
  Filled 2014-10-31: qty 1

## 2014-10-31 MED ORDER — SODIUM CHLORIDE 0.9 % IV SOLN
INTRAVENOUS | Status: DC
  Administered 2014-11-01: via INTRAVENOUS

## 2014-10-31 MED ORDER — CEFTRIAXONE SODIUM 250 MG IJ SOLR
250.0000 mg | Freq: Once | INTRAMUSCULAR | Status: AC
  Administered 2014-10-31: 250 mg via INTRAMUSCULAR
  Filled 2014-10-31: qty 250

## 2014-10-31 MED ORDER — DEXTROSE 5 % IV SOLN
1.0000 g | Freq: Once | INTRAVENOUS | Status: AC
  Administered 2014-11-01: 1 g via INTRAVENOUS
  Filled 2014-10-31: qty 10

## 2014-10-31 NOTE — ED Notes (Signed)
Bed: WA20 Expected date:  Expected time:  Means of arrival:  Comments: EMS- 81yo, fall, re-eval

## 2014-10-31 NOTE — Progress Notes (Signed)
EDCM spoke to patient and her son at bedside.  EDCM questioned patient and her son if they were still interested in home health services and wheelchair?  Patient's son stated, "I don't think we will be needing any of that.  We know about [facility] and who to call to get it."  [facility] then asked patient if if she felt as if she needed home health services and a wheelchair?  Patient responded, "Well. I think I'm doing pretty good.  I don't think I need all of that."  [facility] again asked for clarity if they were sure they didn't want home health?  Patient's son responded, "Yes we are sure.  No thank you."  Patient and son thanked [facility] for services.  No further EDCM needs at this time.

## 2014-10-31 NOTE — ED Notes (Signed)
Hospitalist at bedside 

## 2014-10-31 NOTE — Progress Notes (Addendum)
EDCM informed EDPA that patient and family refused home health services.  EDCM and EDPA spoke to patient and her son at bedside.   Patient's son now agreeable for patient to have home health services with the exception of a [employer], [employer].  Patient's son has chosen [facility] for services.  Patient's son reports he has been staying with the patient.  Patient's son also agreeable to a bedside commode.  He is still unsure of the wheelchair, but will accept a prescription for one for the patient if he changes his mind.  Patient's son thankful for services.  No further EDCM needs at this time.  10/31/2014 2223pm  EDCM provided patient's son with printed order for light weight wheelchair with cushion.  Informed patient's son that he may take this order to [facility] store to pick up wheelchair.  Patient's son thankful for services.

## 2014-10-31 NOTE — ED Notes (Signed)
Patient transported to CT 

## 2014-10-31 NOTE — ED Provider Notes (Signed)
CSN: [id number]     Arrival date & time 10/31/14  1251 History   First MD Initiated Contact with Patient 10/31/14 1303     Chief Complaint  Patient presents with  . Hip Pain  . Back Pain     (Consider location/radiation/quality/duration/timing/severity/associated sxs/prior Treatment) HPI Patient presents to the emergency department with continued pain from a fall that occurred 3 weeks, the patient was seen by her orthopedist and plain x-rays were obtained.  She states that there is no abnormalities noted on those.  The patient states that her pain has been continuing throughout the past couple of weeks used due to see her orthopedist on March 17.  Patient denies any numbness, weakness, dizziness, headache, blurred vision, nausea, vomiting, diarrhea, abdominal pain, chest pain, shortness of breath, fever or syncope, states that she does have some pain with movement Past Medical History  Diagnosis Date  . Hypertension   . Thyroid disease   . Osteoporosis   . Depression    History reviewed. No pertinent past surgical history. No family history on file. History  Substance Use Topics  . Smoking status: Never Smoker   . Smokeless tobacco: Not on file  . Alcohol Use: Not on file   OB History    No data available     Review of Systems All other systems negative except as documented in the HPI. All pertinent positives and negatives as reviewed in the HPI.   Allergies  Fosamax; Hctz; Lanolin; Latex; Neosporin; Norvasc; and Polysporin  Home Medications   Prior to Admission medications   Medication Sig Start Date End Date Taking? Authorizing Provider  calcium-vitamin D (OSCAL WITH D) 500-200 MG-UNIT per tablet Take 1 tablet by mouth daily with breakfast.   Yes Historical Provider, MD  clopidogrel (PLAVIX) 75 MG tablet Take 75 mg by mouth daily.   Yes Historical Provider, MD  eszopiclone (LUNESTA) 2 MG TABS tablet Take 2 mg by mouth at bedtime. Take immediately before bedtime   Yes  Historical Provider, MD  fexofenadine (ALLEGRA) 180 MG tablet Take 180 mg by mouth daily.   Yes Historical Provider, MD  fluticasone (FLONASE) 50 MCG/ACT nasal spray Place into both nostrils daily.   Yes Historical Provider, MD  levothyroxine (SYNTHROID, LEVOTHROID) 75 MCG tablet Take 75 mcg by mouth daily before breakfast.   Yes Historical Provider, MD  LORazepam (ATIVAN) 0.5 MG tablet Take 0.5 mg by mouth every 8 (eight) hours as needed for anxiety.    Yes Historical Provider, MD  Melatonin-Pyridoxine (MELATIN PO) Take 1 tablet by mouth daily as needed (sleep if sleeping medication needs refills).    Yes Historical Provider, MD  montelukast (SINGULAIR) 10 MG tablet Take 10 mg by mouth at bedtime.   Yes Historical Provider, MD  Multiple Vitamins-Minerals (PRESERVISION AREDS PO) Take 1 tablet by mouth 2 (two) times daily.    Yes Historical Provider, MD  simvastatin (ZOCOR) 20 MG tablet Take 20 mg by mouth daily.   Yes Historical Provider, MD   BP 115/69 mmHg  Pulse 58  Temp(Src) 98.6 F (37 C) (Oral)  Resp 18  SpO2 93% Physical Exam  Constitutional: She is oriented to person, place, and time. She appears well-developed and well-nourished. No distress.  HENT:  Head: Normocephalic and atraumatic.  Mouth/Throat: Oropharynx is clear and moist.  Eyes: Pupils are equal, round, and reactive to light.  Neck: Normal range of motion. Neck supple.  Cardiovascular: Normal rate, regular rhythm and normal heart sounds.  Exam reveals no gallop  and no friction rub.   No murmur heard. Pulmonary/Chest: Effort normal and breath sounds normal. No respiratory distress.  Musculoskeletal:       Lumbar back: She exhibits tenderness and pain. She exhibits normal range of motion, no bony tenderness, no deformity and normal pulse.  Neurological: She is alert and oriented to person, place, and time. She displays normal reflexes. She exhibits normal muscle tone. Coordination normal.  Skin: Skin is warm and dry. No  rash noted. No erythema.  Nursing note and vitals reviewed.   ED Course  Procedures (including critical care time) Labs Review Labs Reviewed - No data to display  Imaging Review Dg Lumbar Spine Complete  10/31/2014   CLINICAL DATA:  Fall 2 weeks ago.  Low back and LEFT hip pain.  EXAM: LUMBAR SPINE - COMPLETE 4+ VIEW  COMPARISON:  None.  FINDINGS: L4-L5 posterior lumbar interbody fusion. Radiopaque leads are present over the fusion, likely for prior bones stimulator. No power pack is present. There is adjacent segment disease at L3-L4 with loss of the disc space and endplate sclerosis. Vertebral body height is preserved.  IMPRESSION: No acute osseous abnormality. L4-L5 fusion appears solid with severe L3-L4 adjacent segment degenerative disc disease.   Electronically Signed   By: Dereck Ligas M.D.   On: 10/31/2014 14:36   Dg Hip Unilat With Pelvis 2-3 Views Left  10/31/2014   CLINICAL DATA:  Fall 2 weeks ago.  Low back and LEFT hip pain.  EXAM: LEFT HIP (WITH PELVIS) 2-3 VIEWS  COMPARISON:  None.  FINDINGS: Pelvic rings appear intact. Hip joint spaces are preserved. Atherosclerosis. On the lateral view, LEFT hip shows tiny marginal osteophytes compatible with mild osteoarthritis.  IMPRESSION: Mild LEFT hip osteoarthritis.  No acute osseous abnormality.   Electronically Signed   By: Dereck Ligas M.D.   On: 10/31/2014 14:35     EKG Interpretation None     Patient will be followed while awaiting MRI.  Patient is stable here in the emergency department MDM   Final diagnoses:  Fall  Back pain       Resa Miner [place], PA-C 11/01/14 [place], MD 11/03/14 [phone]

## 2014-10-31 NOTE — Progress Notes (Signed)
CSW met with pt at bedside. Son was present.  Pt presents tonight c/o lower back pain. Pt informed CSW that she fell 2 weeks ago. Son states that he lives at home with pt.   Pt appears to be extremely hard of hearing.  Pt states that she can perform all of her ADL's independently. Son states that the pt has not fallen in the last 6 months besides the fall that happened 2 weeks ago. Pt and son state that they are not interested in a facility. Also, son informed CSW that he does not feel as though he needs help caring for the pt at home.  Son states that he does not have any questions at this time.  Willette Brace [phone] ED CSW 10/31/2014 8:16 PM

## 2014-10-31 NOTE — Progress Notes (Signed)
MRI results obtained, ortho consulted.  EDCM discussed with EDP and EDPA if patient is discharged, home health ordershave been  placed for RN, PT, OT and aide. Please place order for [facility] to [facility].

## 2014-10-31 NOTE — ED Provider Notes (Signed)
Medical screening examination/treatment/procedure(s) were conducted as a shared visit with non-physician practitioner(s) and myself.  I personally evaluated the patient during the encounter.   EKG Interpretation None     Patient here with persistent lower back pain after a fall 2 weeks ago. Pain is worse with standing has been seen by orthopedist. Plain x-rays negative reported MRI here.  Leota Jacobsen, MD 10/31/14 [phone]

## 2014-10-31 NOTE — ED Notes (Signed)
Per EMS, pt fell 2 weeks ago and complains of lower back/hip pain since. Pt was seen by by an orthopedic doctor, Dr. Grandville Silos. Pt had x-rays performed, which she states were negative. Her doctor told her she may need an MRI. Pt able to walk with some difficulty, states the pain is 8/10.

## 2014-10-31 NOTE — ED Notes (Signed)
Pt transported to MRI 

## 2014-10-31 NOTE — ED Provider Notes (Signed)
PROGRESS NOTE                                                                                                                 This is a sign-out from PA Lawer at shift change: [place] is a 81 y.o. female presenting with left hip pain and negative plain films. Plan is to f/u MRI. Please refer to previous note for full HPI, ROS, PMH and PE.   Extensive delay on patient going to MRI. She is given Percocet in the ED and reports significant subjective improvement. She is able to and really with assistance. After extensive discussion with her and her son we find the patient lives in a second story condo, her son lives with her and cares for her 48 7. We have brought in case management was offered to get PT, home health and social worker involved initially patient declined and then I have convinced him that this would be beneficial. Orders are made, attending physician is put in a face-to-face.   Urinalysis is consistent with infection, urine culture is ordered,   MRI shows bilateral sacral insufficiency fractures. Orthopedic consult from Dr. Tamera Punt appreciated: Considering patient's age, UTI, difficulty in her home situation and severe pain is reasonable to bring her in, he agrees with hospitalist admission. Case discussed with [facility]ad hospitalist Dr. Hal Hope who recommends obtaining EKG and head CT, patient will be admitted to a telemetry bed for pain control in addition to PT.      Monico Blitz, PA-C 10/31/14 2324  Leota Jacobsen, MD 11/01/14 1010

## 2014-10-31 NOTE — Progress Notes (Signed)
Confirmed with pt pcp is Hal Stoneking CM inquired about home DME Pt has 2 canes and a walker Son at bedside voiced that he would like to take pt home but would need assist with getting pt to her condo, up steps and to back of condo unit.  Voiced interest in w/c CM reviewed in details medicare guidelines, home health [facility]) (length of stay in home, types of [facility] staff available, coverage, primary caregiver, up to 24 hrs before services may be started) Cm discussed with them that home health agencies provide DME.   Pending imaging results to determine need for DME or home health services  Provided with list of [place] home health agencies to review.

## 2014-10-31 NOTE — ED Notes (Signed)
Pt walked with help

## 2014-11-01 ENCOUNTER — Encounter (HOSPITAL_COMMUNITY): Payer: Self-pay | Admitting: Internal Medicine

## 2014-11-01 DIAGNOSIS — E785 Hyperlipidemia, unspecified: Secondary | ICD-10-CM | POA: Diagnosis present

## 2014-11-01 DIAGNOSIS — E039 Hypothyroidism, unspecified: Secondary | ICD-10-CM | POA: Diagnosis present

## 2014-11-01 DIAGNOSIS — E038 Other specified hypothyroidism: Secondary | ICD-10-CM

## 2014-11-01 DIAGNOSIS — I350 Nonrheumatic aortic (valve) stenosis: Secondary | ICD-10-CM

## 2014-11-01 DIAGNOSIS — N390 Urinary tract infection, site not specified: Secondary | ICD-10-CM | POA: Diagnosis present

## 2014-11-01 DIAGNOSIS — S3210XS Unspecified fracture of sacrum, sequela: Secondary | ICD-10-CM

## 2014-11-01 DIAGNOSIS — M8448XA Pathological fracture, other site, initial encounter for fracture: Principal | ICD-10-CM

## 2014-11-01 LAB — COMPREHENSIVE METABOLIC PANEL
ALBUMIN: 3 g/dL — AB (ref 3.5–5.2)
ALK PHOS: 58 U/L (ref 39–117)
ALT: 12 U/L (ref 0–35)
ANION GAP: 8 (ref 5–15)
AST: 19 U/L (ref 0–37)
BILIRUBIN TOTAL: 0.8 mg/dL (ref 0.3–1.2)
BUN: 16 mg/dL (ref 6–23)
CO2: 24 mmol/L (ref 19–32)
CREATININE: 0.93 mg/dL (ref 0.50–1.10)
Calcium: 8.6 mg/dL (ref 8.4–10.5)
Chloride: 105 mmol/L (ref 96–112)
GFR calc Af Amer: 61 mL/min — ABNORMAL LOW (ref >90)
GFR, EST NON AFRICAN AMERICAN: 53 mL/min — AB (ref >90)
GLUCOSE: 92 mg/dL (ref 70–99)
POTASSIUM: 4.2 mmol/L (ref 3.5–5.1)
Sodium: 137 mmol/L (ref 135–145)
Total Protein: 5.9 g/dL — ABNORMAL LOW (ref 6.0–8.3)

## 2014-11-01 LAB — CBC WITH DIFFERENTIAL/PLATELET
BASOS ABS: 0 10*3/uL (ref 0.0–0.1)
BASOS PCT: 1 % (ref 0–1)
EOS PCT: 4 % (ref 0–5)
Eosinophils Absolute: 0.2 10*3/uL (ref 0.0–0.7)
HCT: 30.6 % — ABNORMAL LOW (ref 36.0–46.0)
Hemoglobin: 10.2 g/dL — ABNORMAL LOW (ref 12.0–15.0)
Lymphocytes Relative: 16 % (ref 12–46)
Lymphs Abs: 0.9 10*3/uL (ref 0.7–4.0)
MCH: 31.9 pg (ref 26.0–34.0)
MCHC: 33.3 g/dL (ref 30.0–36.0)
MCV: 95.6 fL (ref 78.0–100.0)
MONO ABS: 0.6 10*3/uL (ref 0.1–1.0)
Monocytes Relative: 11 % (ref 3–12)
Neutro Abs: 3.8 10*3/uL (ref 1.7–7.7)
Neutrophils Relative %: 68 % (ref 43–77)
PLATELETS: 167 10*3/uL (ref 150–400)
RBC: 3.2 MIL/uL — ABNORMAL LOW (ref 3.87–5.11)
RDW: 13.2 % (ref 11.5–15.5)
WBC: 5.6 10*3/uL (ref 4.0–10.5)

## 2014-11-01 LAB — URINE CULTURE: Colony Count: >=100000

## 2014-11-01 LAB — TROPONIN I: TROPONIN I: 0.03 ng/mL (ref <0.031)

## 2014-11-01 MED ORDER — MORPHINE SULFATE 2 MG/ML IJ SOLN: 1.0000 mg | INTRAMUSCULAR | Status: DC | PRN

## 2014-11-01 MED ORDER — ONDANSETRON HCL 4 MG/2ML IJ SOLN: 4.0000 mg | Freq: Four times a day (QID) | INTRAMUSCULAR | Status: DC | PRN

## 2014-11-01 MED ORDER — LORATADINE 10 MG PO TABS
10.0000 mg | ORAL_TABLET | Freq: Every day | ORAL | Status: DC
  Administered 2014-11-01 – 2014-11-02 (×2): 10 mg via ORAL
  Filled 2014-11-01 (×2): qty 1

## 2014-11-01 MED ORDER — MONTELUKAST SODIUM 10 MG PO TABS
10.0000 mg | ORAL_TABLET | Freq: Every day | ORAL | Status: DC
  Administered 2014-11-01 – 2014-11-04 (×4): 10 mg via ORAL
  Filled 2014-11-01 (×5): qty 1

## 2014-11-01 MED ORDER — DEXTROSE 5 % IV SOLN
1.0000 g | Freq: Every day | INTRAVENOUS | Status: DC
  Administered 2014-11-01: 1 g via INTRAVENOUS
  Filled 2014-11-01 (×2): qty 10

## 2014-11-01 MED ORDER — SIMVASTATIN 20 MG PO TABS
20.0000 mg | ORAL_TABLET | Freq: Every day | ORAL | Status: DC
  Administered 2014-11-01 – 2014-11-05 (×5): 20 mg via ORAL
  Filled 2014-11-01 (×5): qty 1

## 2014-11-01 MED ORDER — SODIUM CHLORIDE 0.9 % IJ SOLN
3.0000 mL | Freq: Two times a day (BID) | INTRAMUSCULAR | Status: DC
  Administered 2014-11-01 – 2014-11-04 (×5): 3 mL via INTRAVENOUS

## 2014-11-01 MED ORDER — CLOPIDOGREL BISULFATE 75 MG PO TABS
75.0000 mg | ORAL_TABLET | Freq: Every day | ORAL | Status: DC
  Administered 2014-11-01 – 2014-11-05 (×5): 75 mg via ORAL
  Filled 2014-11-01 (×5): qty 1

## 2014-11-01 MED ORDER — LORAZEPAM 0.5 MG PO TABS
0.5000 mg | ORAL_TABLET | Freq: Three times a day (TID) | ORAL | Status: DC | PRN
  Administered 2014-11-04 – 2014-11-05 (×2): 0.5 mg via ORAL
  Filled 2014-11-01 (×3): qty 1

## 2014-11-01 MED ORDER — FLUTICASONE PROPIONATE 50 MCG/ACT NA SUSP
2.0000 | Freq: Every day | NASAL | Status: DC
  Administered 2014-11-01 – 2014-11-05 (×5): 2 via NASAL
  Filled 2014-11-01 (×2): qty 16

## 2014-11-01 MED ORDER — ONDANSETRON HCL 4 MG PO TABS: 4.0000 mg | ORAL_TABLET | Freq: Four times a day (QID) | ORAL | Status: DC | PRN

## 2014-11-01 MED ORDER — LEVOTHYROXINE SODIUM 75 MCG PO TABS
75.0000 ug | ORAL_TABLET | Freq: Every day | ORAL | Status: DC
  Administered 2014-11-01 – 2014-11-05 (×5): 75 ug via ORAL
  Filled 2014-11-01 (×5): qty 1

## 2014-11-01 MED ORDER — ACETAMINOPHEN 325 MG PO TABS
650.0000 mg | ORAL_TABLET | Freq: Four times a day (QID) | ORAL | Status: DC | PRN
  Administered 2014-11-03 – 2014-11-05 (×3): 650 mg via ORAL
  Filled 2014-11-01 (×3): qty 2

## 2014-11-01 MED ORDER — ACETAMINOPHEN 650 MG RE SUPP: 650.0000 mg | Freq: Four times a day (QID) | RECTAL | Status: DC | PRN

## 2014-11-01 MED ORDER — ZOLPIDEM TARTRATE 5 MG PO TABS
5.0000 mg | ORAL_TABLET | Freq: Every day | ORAL | Status: DC
  Administered 2014-11-01: 5 mg via ORAL
  Filled 2014-11-01: qty 1

## 2014-11-01 MED ORDER — ENOXAPARIN SODIUM 40 MG/0.4ML ~~LOC~~ SOLN
40.0000 mg | SUBCUTANEOUS | Status: DC
  Administered 2014-11-01 – 2014-11-05 (×5): 40 mg via SUBCUTANEOUS
  Filled 2014-11-01 (×5): qty 0.4

## 2014-11-01 NOTE — Progress Notes (Signed)
Clinical Social Work Department BRIEF PSYCHOSOCIAL ASSESSMENT 11/01/2014  Patient:  Jacqueline Robles, Jacqueline Robles     Account Number:  [id number]     Admit date:  10/31/2014  Clinical Social Worker:  Maryln Manuel  Date/Time:  11/01/2014 03:00 PM  Referred by:  Physician  Date Referred:  11/01/2014 Referred for  SNF Placement   Other Referral:   Interview type:  Patient Other interview type:   and patient son, Elta Guadeloupe at beside    PSYCHOSOCIAL DATA Living Status:  FAMILY Admitted from facility:   Level of care:   Primary support name:  Elta Guadeloupe Wooley/son/[phone] Primary support relationship to patient:  CHILD, ADULT Degree of support available:   adequate    CURRENT CONCERNS Current Concerns  Post-Acute Placement   Other Concerns:    SOCIAL WORK ASSESSMENT / PLAN CSW received referral for New SNF.    CSW met with pt and pt son, Elta Guadeloupe at bedside. CSW introduced self and explained role. CSW provided supporive listening as pt son, Elta Guadeloupe discussed that pt lives with pt children, but they are unable to provide 24 hour care at this time and prefer to explore SNF placement. Pt son stated that pt daughter, Opal Sidles is Media planner and pt daughter, Opal Sidles will need to be person to make decision about rehab facility. Per pt son, pt daughter, Opal Sidles is currently out of town and will return on Sunday. CSW expressed understanding and discussed process of SNF placement and inquired with pt son if he was agreeable to exploring SNF options in order to have bed offers to provide to pt daughter on Sunday for decision to be made. Pt son agreeable to initiation of SNF search in [facility].    CSW provided emotional support to pt as she discussed that her stomach was upset from eating and she expressed fearing to eat again due to upset stomach. Support provided and CSW explained to pt to notify RN of any discomfort.    CSW completed FL2 and initiated SNF search to [facility]. Pt has Medicare insurance which  requires a 3-night inpatient stay.    Weekend CSW to follow up with pt son and pt daughter regarding SNF bed offers in order for decision to be made regarding SNF placement.    CSW to continue to follow to provide support and assist with pt disposition needs.   Assessment/plan status:  Psychosocial Support/Ongoing Assessment of Needs Other assessment/ plan:   discharge planning   Information/referral to community resources:   [facility] list    PATIENT'S/FAMILY'S RESPONSE TO PLAN OF CARE: Pt alert and oriented to person only. Pt son supportive and actively involved in pt care, but states that pt daughter is the decision maker regarding decision for SNF. Pt son expressed that the past 24 hours have been overwhelming as pt spend 12 hours in the ED and pt son has only had approximately 3 hours of sleep. Support provided. Weekend CSW to follow up with pt daughter re: SNF bed offers to get decision for SNF.   Alison Murray, MSW, LCSW Clinical Social Work Coverage for [employer], [place]

## 2014-11-01 NOTE — Progress Notes (Signed)
PROGRESS NOTE  Winfield YBO:[id number] DOB: 02-26-1922 DOA: 10/31/2014 PCP: Mathews Argyle, MD  Brief history 81 year old female with a history of aortic stenosis, hyperlipidemia, hypothyroidism presented with worsening back pain. The patient had a mechanical fall getting out of the bathroom 2 weeks prior to the admission. There was no loss of consciousness. Because of worsening low back pain the patient was brought to the emergency department. MRI of the lumbar spine was negative for any fractures but did show moderate spinal stenosis L2-3, L3-4 as well as bilateral sacral insufficiency fractures.[name] MRI of the hips confirmed bilateral nondisplaced sacral insufficiency fractures. There was also partial tears of her bilateral hamstrings. Assessment/Plan: Bilateral sacral insufficiency fractures -Discussed with orthopedics, Dr. Domenic Schwab always with walker-->no surgical intervention -PT evaluation -Control pain -Check 25 vitamin D Pyuria -Concern about UTI -Continue ceftriaxone pending culture data Gait instability, mechanical falls  -PT evaluation Aortic stenosis  -07/22/2014 echocardiogram EF 27-78%, grade 1 diastolic dysfunction, moderate to severe aortic stenosis  -Asymptomatic at this time  hyperlipidemia  -Continue statin  Hypothyroidism  -Continue Synthroid  Normocytic anemia -stable for outpt followup Family Communication:   Pt at beside Disposition Plan:   Home vs SNF       Procedures/Studies: Dg Lumbar Spine Complete  10/31/2014   CLINICAL DATA:  Fall 2 weeks ago.  Low back and LEFT hip pain.  EXAM: LUMBAR SPINE - COMPLETE 4+ VIEW  COMPARISON:  None.  FINDINGS: L4-L5 posterior lumbar interbody fusion. Radiopaque leads are present over the fusion, likely for prior bones stimulator. No power pack is present. There is adjacent segment disease at L3-L4 with loss of the disc space and endplate sclerosis. Vertebral body height is preserved.  IMPRESSION:  No acute osseous abnormality. L4-L5 fusion appears solid with severe L3-L4 adjacent segment degenerative disc disease.   Electronically Signed   By: Dereck Ligas M.D.   On: 10/31/2014 14:36   Ct Head Wo Contrast  11/01/2014   CLINICAL DATA:  Status post fall, with acute onset of confusion. Initial encounter.  EXAM: CT HEAD WITHOUT CONTRAST  TECHNIQUE: Contiguous axial images were obtained from the base of the skull through the vertex without intravenous contrast.  COMPARISON:  MRI of the brain performed 11/05/2003  FINDINGS: There is no evidence of acute infarction, mass lesion, or intra- or extra-axial hemorrhage on CT.  Prominence of the ventricles and sulci reflects moderate cortical volume loss. Cerebellar atrophy is noted. Scattered periventricular and subcortical white matter change likely reflects small vessel ischemic microangiopathy.  The brainstem and fourth ventricle are within normal limits. The basal ganglia are unremarkable in appearance. The cerebral hemispheres demonstrate grossly normal gray-white differentiation. No mass effect or midline shift is seen.  There is no evidence of fracture; visualized osseous structures are unremarkable in appearance. The visualized portions of the orbits are within normal limits. The paranasal sinuses and mastoid air cells are well-aerated. No significant soft tissue abnormalities are seen.  IMPRESSION: 1. No acute intracranial pathology seen on CT. 2. Moderate cortical volume loss and scattered small vessel ischemic microangiopathy.   Electronically Signed   By: Garald Balding M.D.   On: 11/01/2014 00:32   Mr Lumbar Spine Wo Contrast  10/31/2014   CLINICAL DATA:  Initial evaluation of low back pain and hip pain status post recent fall 2 weeks ago.  EXAM: MRI LUMBAR SPINE WITHOUT CONTRAST  TECHNIQUE: Multiplanar, multisequence MR imaging of the lumbar spine was performed. No intravenous  contrast was administered.  COMPARISON:  Concomitant MRI the pelvis as  well as previous radiographs from 06/25/2013.  FINDINGS: For the purposes of this dictation, the lowest well-formed intervertebral disc space is presumed to be the L5-S1 level, and there presumed to be 5 lumbar type vertebral bodies.  Patient is status post spinal fusion at L4-5 with extensive susceptibility artifact present within the lumbar spine in this region.  Vertebral body heights are preserved. No lumbar fracture. There is suspected trace anterolisthesis of L3 on L4. Vertebral bodies are otherwise normally aligned.  Abnormal T1 hypo intense, T2/STIR hyperintense signal intensity seen within the bilateral sacral ala, compatible with insufficiency fractures. These are nondisplaced. Otherwise, signal intensity within the vertebral body bone marrow is normal.  Conus medullaris terminates normally at the L1 level. Signal intensity within the visualized cord is unremarkable. Nerve roots of the cauda equina grossly within normal limits.  Paraspinous soft tissues within normal limits. Visualized visceral structures are unremarkable. No retroperitoneal adenopathy. There is mild aneurysmal dilatation of the intra-abdominal aorta up to 3.3 cm.  Mild fatty atrophy noted within the paraspinous musculature.  At T10-11 through T12-L1, there is no disc bulge or disc protrusion. No significant canal or foraminal stenosis.  L1-2: Mild disc bulge without focal disc protrusion. There is bilateral facet hypertrophy with ligamentum flavum thickening. There is mild canal stenosis without significant foraminal narrowing.  L2-3: Mild diffuse disc bulge without focal disc protrusion. Mild bilateral facet arthrosis with ligamentum flavum thickening. There is resultant probable moderate canal stenosis, although evaluation somewhat limited due to susceptibility artifact from adjacent hardware. Moderate bilateral foraminal narrowing present as well, worse on the left.  L3-4: Status post spinal fusion. There is probable mild disc bulge  with bilateral facet arthrosis. There is resultant moderate canal stenosis. Foramina not well evaluated at this level, however, at least moderate bilateral foraminal narrowing is suspected.  L4-5: Status post spinal fusion. This levels poorly evaluated due to susceptibility artifact.  L5-S1: No significant disc bulge or disc protrusion. No canal stenosis. No definite foraminal narrowing.  IMPRESSION: 1. Bilateral sacral insufficiency fractures without displacement. Finding is better evaluated on concomitant MRI of the pelvis. 2. No other acute abnormality within the lumbar spine. 3. Status post spinal fusion at L4 and L5 without complication. 4. Degenerative disc bulge and facet hypertrophy at L2-3 with resultant moderate canal and bilateral foraminal stenosis, slightly worse on the left. 5. Mild degenerative disc bulge and facet arthrosis at L3-4 with resultant moderate canal stenosis. 6. Mild aneurysmal dilatation of the infrarenal aorta up to 3.3 cm.   Electronically Signed   By: Jeannine Boga M.D.   On: 10/31/2014 22:31   Mr Hip Left Wo Contrast  10/31/2014   CLINICAL DATA:  Status post fall.  Low back pain.  EXAM: MR OF THE LEFT HIP WITHOUT CONTRAST  TECHNIQUE: Multiplanar, multisequence MR imaging was performed. No intravenous contrast was administered.  COMPARISON:  None.  FINDINGS: Bones: No hip fracture, dislocation or avascular necrosis. Marrow edema sacrum bilaterally consistent with sacral insufficiency fractures.  There is no other marrow signal abnormality.  Articular cartilage and labrum  Articular cartilage: Partial-thickness left superior hip cartilage loss. Mild right hip partial thickness cartilage loss.  Labrum:  No gross labral or paralabral abnormality.  Joint or bursal effusion  Joint effusion:  No joint effusion.  Bursae:  No bursa formation.  Muscles and tendons  Flexors: Normal.  Extensors: Normal.  Abductors: Normal.  Adductors: Normal.  Rotators: Normal.  Hamstrings:  Very small  partial tear is at the hamstring origin bilaterally.  Piriformis: Mild edema of the right piriformis muscle and adjacent to the right piriformis muscle most consistent with muscle strain.  Other findings  Miscellaneous: Diverticulosis without evidence of diverticulitis. No pelvic free fluid. No inguinal lymphadenopathy. No inguinal hernia.  IMPRESSION: 1. Acute, bilateral nondisplaced sacral insufficiency fractures. 2. No hip fracture, dislocation or avascular necrosis.   Electronically Signed   By: Kathreen Devoid   On: 10/31/2014 21:55   Dg Hip Unilat With Pelvis 2-3 Views Left  10/31/2014   CLINICAL DATA:  Fall 2 weeks ago.  Low back and LEFT hip pain.  EXAM: LEFT HIP (WITH PELVIS) 2-3 VIEWS  COMPARISON:  None.  FINDINGS: Pelvic rings appear intact. Hip joint spaces are preserved. Atherosclerosis. On the lateral view, LEFT hip shows tiny marginal osteophytes compatible with mild osteoarthritis.  IMPRESSION: Mild LEFT hip osteoarthritis.  No acute osseous abnormality.   Electronically Signed   By: Dereck Ligas M.D.   On: 10/31/2014 14:35         Subjective:  patient complains feeling hot and thirsty. Denies any fevers, chills, chest pain, shortness breath, vomiting or diarrhea, and abdominal pain. She complains of some back pain.  Objective: Filed Vitals:   10/31/14 1903 10/31/14 2220 11/01/14 0032 11/01/14 0443  BP: 115/63 153/52 146/108 132/57  Pulse: 70 61 70 64  Temp: 98.2 F (36.8 C)  98.4 F (36.9 C) 98.9 F (37.2 C)  TempSrc: Oral  Oral Oral  Resp: 18 18 18 16   Height:   5\' 4"  (1.626 m)   Weight:   59.4 kg (130 lb 15.3 oz)   SpO2: 94% 93% 92% 92%   No intake or output data in the 24 hours ending 11/01/14 0818 Weight change:  Exam:   General:  Pt is alert, follows commands appropriately, not in acute distress  HEENT: No icterus, No thrush, [place]/AT  Cardiovascular: RRR, S1/S2, no rubs, no gallops  Respiratory: CTA bilaterally, no wheezing, no crackles, no  rhonchi  Abdomen: Soft/+BS, non tender, non distended, no guarding  Extremities: No edema, No lymphangitis, No petechiae, No rashes, no synovitis  Data Reviewed: Basic Metabolic Panel:  Recent Labs Lab 10/31/14 1809 11/01/14 0400  NA 138 137  K 4.3 4.2  CL 106 105  CO2 26 24  GLUCOSE 86 92  BUN 18 16  CREATININE 0.93 0.93  CALCIUM 8.7 8.6   Liver Function Tests:  Recent Labs Lab 10/31/14 1809 11/01/14 0400  AST 21 19  ALT 13 12  ALKPHOS 60 58  BILITOT 0.7 0.8  PROT 6.0 5.9*  ALBUMIN 3.1* 3.0*   No results for input(s): LIPASE, AMYLASE in the last 168 hours. No results for input(s): AMMONIA in the last 168 hours. CBC:  Recent Labs Lab 10/31/14 1809 11/01/14 0400  WBC 5.9 5.6  NEUTROABS 3.7 3.8  HGB 10.3* 10.2*  HCT 31.7* 30.6*  MCV 95.5 95.6  PLT 156 167   Cardiac Enzymes:  Recent Labs Lab 11/01/14 0400  TROPONINI 0.03   BNP: Invalid input(s): POCBNP CBG: No results for input(s): GLUCAP in the last 168 hours.  No results found for this or any previous visit (from the past 240 hour(s)).   Scheduled Meds: . cefTRIAXone (ROCEPHIN) IVPB 1 gram/50 mL D5W  1 g Intravenous QHS  . clopidogrel  75 mg Oral Daily  . enoxaparin (LOVENOX) injection  40 mg Subcutaneous Q24H  . fluticasone  2 spray Each Nare Daily  .  levothyroxine  75 mcg Oral QAC breakfast  . loratadine  10 mg Oral Daily  . montelukast  10 mg Oral QHS  . simvastatin  20 mg Oral Daily  . sodium chloride  3 mL Intravenous Q12H  . zolpidem  5 mg Oral QHS   Continuous Infusions:    Amenda Duclos, DO  [facility] Pager [phone]  If 7PM-7AM, please contact night-coverage [email or web address] Password [email or web address] 11/01/2014, 8:18 AM   LOS: 1 day

## 2014-11-01 NOTE — Consult Note (Signed)
Reason for Consult:Evaluate low back pain Referring Physician: Tat  Makenzee Choudhry Nesmith is an 81 y.o. female.  HPI: 81 year old with several week history of low back pain.  She was evaluated by Dr. Grandville Silos who felt that if she did not  Improve an MRI would be necessary.  She presented to ED yesterday with son who was having a difficult time caring for.  She continues to complain of low back pain. No LE N/T/W.   Past Medical History  Diagnosis Date  . Hypertension   . Thyroid disease   . Osteoporosis   . Depression   . Hyperlipidemia     Past Surgical History  Procedure Laterality Date  . Neck surgery    . Foot surgery      Family History  Problem Relation Age of Onset  . Hypertension Son     Social History:  reports that she has never smoked. She does not have any smokeless tobacco history on file. She reports that she does not drink alcohol or use illicit drugs.  Allergies:  Allergies  Allergen Reactions  . Fosamax [Alendronate Sodium]   . Hctz [Hydrochlorothiazide]   . Lanolin   . Latex   . Neosporin [Neomycin-Bacitracin Zn-Polymyx]   . Norvasc [Amlodipine Besylate]   . Polysporin [Bacitracin-Polymyxin B]     rash    Medications: I have reviewed the patient's current medications.  Results for orders placed or performed during the hospital encounter of 10/31/14 (from the past 48 hour(s))  CBC with Differential     Status: Abnormal   Collection Time: 10/31/14  6:09 PM  Result Value Ref Range   WBC 5.9 4.0 - 10.5 K/uL   RBC 3.32 (L) 3.87 - 5.11 MIL/uL   Hemoglobin 10.3 (L) 12.0 - 15.0 g/dL   HCT 31.7 (L) 36.0 - 46.0 %   MCV 95.5 78.0 - 100.0 fL   MCH 31.0 26.0 - 34.0 pg   MCHC 32.5 30.0 - 36.0 g/dL   RDW 13.3 11.5 - 15.5 %   Platelets 156 150 - 400 K/uL   Neutrophils Relative % 62 43 - 77 %   Neutro Abs 3.7 1.7 - 7.7 K/uL   Lymphocytes Relative 22 12 - 46 %   Lymphs Abs 1.3 0.7 - 4.0 K/uL   Monocytes Relative 11 3 - 12 %   Monocytes Absolute 0.6 0.1 - 1.0 K/uL    Eosinophils Relative 4 0 - 5 %   Eosinophils Absolute 0.2 0.0 - 0.7 K/uL   Basophils Relative 1 0 - 1 %   Basophils Absolute 0.0 0.0 - 0.1 K/uL  Basic metabolic panel     Status: Abnormal   Collection Time: 10/31/14  6:09 PM  Result Value Ref Range   Sodium 138 135 - 145 mmol/L   Potassium 4.3 3.5 - 5.1 mmol/L   Chloride 106 96 - 112 mmol/L   CO2 26 19 - 32 mmol/L   Glucose, Bld 86 70 - 99 mg/dL   BUN 18 6 - 23 mg/dL   Creatinine, Ser 0.93 0.50 - 1.10 mg/dL   Calcium 8.7 8.4 - 10.5 mg/dL   GFR calc non Af Amer 53 (L) >90 mL/min   GFR calc Af Amer 61 (L) >90 mL/min    Comment: (NOTE) The eGFR has been calculated using the CKD EPI equation. This calculation has not been validated in all clinical situations. eGFR's persistently <90 mL/min signify possible Chronic Kidney Disease.    Anion gap 6 5 - 15  Hepatic function panel     Status: Abnormal   Collection Time: 10/31/14  6:09 PM  Result Value Ref Range   Total Protein 6.0 6.0 - 8.3 g/dL   Albumin 3.1 (L) 3.5 - 5.2 g/dL   AST 21 0 - 37 U/L   ALT 13 0 - 35 U/L   Alkaline Phosphatase 60 39 - 117 U/L   Total Bilirubin 0.7 0.3 - 1.2 mg/dL   Bilirubin, Direct 0.1 0.0 - 0.5 mg/dL   Indirect Bilirubin 0.6 0.3 - 0.9 mg/dL  Protime-INR     Status: None   Collection Time: 10/31/14  6:09 PM  Result Value Ref Range   Prothrombin Time 14.3 11.6 - 15.2 seconds   INR 1.10 0.00 - 1.49  APTT     Status: None   Collection Time: 10/31/14  6:09 PM  Result Value Ref Range   aPTT 37 24 - 37 seconds    Comment:        IF BASELINE aPTT IS ELEVATED, SUGGEST PATIENT RISK ASSESSMENT BE USED TO DETERMINE APPROPRIATE ANTICOAGULANT THERAPY.   Urinalysis, Routine w reflex microscopic     Status: Abnormal   Collection Time: 10/31/14  7:45 PM  Result Value Ref Range   Color, Urine YELLOW YELLOW   APPearance CLOUDY (A) CLEAR   Specific Gravity, Urine 1.015 1.005 - 1.030   pH 7.0 5.0 - 8.0   Glucose, UA NEGATIVE NEGATIVE mg/dL   Hgb urine  dipstick TRACE (A) NEGATIVE   Bilirubin Urine NEGATIVE NEGATIVE   Ketones, ur NEGATIVE NEGATIVE mg/dL   Protein, ur NEGATIVE NEGATIVE mg/dL   Urobilinogen, UA 0.2 0.0 - 1.0 mg/dL   Nitrite POSITIVE (A) NEGATIVE   Leukocytes, UA LARGE (A) NEGATIVE  Urine microscopic-add on     Status: None   Collection Time: 10/31/14  7:45 PM  Result Value Ref Range   Squamous Epithelial / LPF RARE RARE   WBC, UA 21-50 <3 WBC/hpf    Comment: WITH CLUMPS   RBC / HPF 0-2 <3 RBC/hpf   Bacteria, UA RARE RARE  Comprehensive metabolic panel     Status: Abnormal   Collection Time: 11/01/14  4:00 AM  Result Value Ref Range   Sodium 137 135 - 145 mmol/L   Potassium 4.2 3.5 - 5.1 mmol/L   Chloride 105 96 - 112 mmol/L   CO2 24 19 - 32 mmol/L   Glucose, Bld 92 70 - 99 mg/dL   BUN 16 6 - 23 mg/dL   Creatinine, Ser 0.93 0.50 - 1.10 mg/dL   Calcium 8.6 8.4 - 10.5 mg/dL   Total Protein 5.9 (L) 6.0 - 8.3 g/dL   Albumin 3.0 (L) 3.5 - 5.2 g/dL   AST 19 0 - 37 U/L   ALT 12 0 - 35 U/L   Alkaline Phosphatase 58 39 - 117 U/L   Total Bilirubin 0.8 0.3 - 1.2 mg/dL   GFR calc non Af Amer 53 (L) >90 mL/min   GFR calc Af Amer 61 (L) >90 mL/min    Comment: (NOTE) The eGFR has been calculated using the CKD EPI equation. This calculation has not been validated in all clinical situations. eGFR's persistently <90 mL/min signify possible Chronic Kidney Disease.    Anion gap 8 5 - 15  CBC with Differential/Platelet     Status: Abnormal   Collection Time: 11/01/14  4:00 AM  Result Value Ref Range   WBC 5.6 4.0 - 10.5 K/uL   RBC 3.20 (L) 3.87 - 5.11  MIL/uL   Hemoglobin 10.2 (L) 12.0 - 15.0 g/dL   HCT 30.6 (L) 36.0 - 46.0 %   MCV 95.6 78.0 - 100.0 fL   MCH 31.9 26.0 - 34.0 pg   MCHC 33.3 30.0 - 36.0 g/dL   RDW 13.2 11.5 - 15.5 %   Platelets 167 150 - 400 K/uL   Neutrophils Relative % 68 43 - 77 %   Neutro Abs 3.8 1.7 - 7.7 K/uL   Lymphocytes Relative 16 12 - 46 %   Lymphs Abs 0.9 0.7 - 4.0 K/uL   Monocytes Relative  11 3 - 12 %   Monocytes Absolute 0.6 0.1 - 1.0 K/uL   Eosinophils Relative 4 0 - 5 %   Eosinophils Absolute 0.2 0.0 - 0.7 K/uL   Basophils Relative 1 0 - 1 %   Basophils Absolute 0.0 0.0 - 0.1 K/uL  Troponin I     Status: None   Collection Time: 11/01/14  4:00 AM  Result Value Ref Range   Troponin I 0.03 <0.031 ng/mL    Comment:        NO INDICATION OF MYOCARDIAL INJURY.     Dg Lumbar Spine Complete  10/31/2014   CLINICAL DATA:  Fall 2 weeks ago.  Low back and LEFT hip pain.  EXAM: LUMBAR SPINE - COMPLETE 4+ VIEW  COMPARISON:  None.  FINDINGS: L4-L5 posterior lumbar interbody fusion. Radiopaque leads are present over the fusion, likely for prior bones stimulator. No power pack is present. There is adjacent segment disease at L3-L4 with loss of the disc space and endplate sclerosis. Vertebral body height is preserved.  IMPRESSION: No acute osseous abnormality. L4-L5 fusion appears solid with severe L3-L4 adjacent segment degenerative disc disease.   Electronically Signed   By: Dereck Ligas M.D.   On: 10/31/2014 14:36   Ct Head Wo Contrast  11/01/2014   CLINICAL DATA:  Status post fall, with acute onset of confusion. Initial encounter.  EXAM: CT HEAD WITHOUT CONTRAST  TECHNIQUE: Contiguous axial images were obtained from the base of the skull through the vertex without intravenous contrast.  COMPARISON:  MRI of the brain performed 11/05/2003  FINDINGS: There is no evidence of acute infarction, mass lesion, or intra- or extra-axial hemorrhage on CT.  Prominence of the ventricles and sulci reflects moderate cortical volume loss. Cerebellar atrophy is noted. Scattered periventricular and subcortical white matter change likely reflects small vessel ischemic microangiopathy.  The brainstem and fourth ventricle are within normal limits. The basal ganglia are unremarkable in appearance. The cerebral hemispheres demonstrate grossly normal gray-white differentiation. No mass effect or midline shift is  seen.  There is no evidence of fracture; visualized osseous structures are unremarkable in appearance. The visualized portions of the orbits are within normal limits. The paranasal sinuses and mastoid air cells are well-aerated. No significant soft tissue abnormalities are seen.  IMPRESSION: 1. No acute intracranial pathology seen on CT. 2. Moderate cortical volume loss and scattered small vessel ischemic microangiopathy.   Electronically Signed   By: Garald Balding M.D.   On: 11/01/2014 00:32   Mr Lumbar Spine Wo Contrast  10/31/2014   CLINICAL DATA:  Initial evaluation of low back pain and hip pain status post recent fall 2 weeks ago.  EXAM: MRI LUMBAR SPINE WITHOUT CONTRAST  TECHNIQUE: Multiplanar, multisequence MR imaging of the lumbar spine was performed. No intravenous contrast was administered.  COMPARISON:  Concomitant MRI the pelvis as well as previous radiographs from 06/25/2013.  FINDINGS: For the  purposes of this dictation, the lowest well-formed intervertebral disc space is presumed to be the L5-S1 level, and there presumed to be 5 lumbar type vertebral bodies.  Patient is status post spinal fusion at L4-5 with extensive susceptibility artifact present within the lumbar spine in this region.  Vertebral body heights are preserved. No lumbar fracture. There is suspected trace anterolisthesis of L3 on L4. Vertebral bodies are otherwise normally aligned.  Abnormal T1 hypo intense, T2/STIR hyperintense signal intensity seen within the bilateral sacral ala, compatible with insufficiency fractures. These are nondisplaced. Otherwise, signal intensity within the vertebral body bone marrow is normal.  Conus medullaris terminates normally at the L1 level. Signal intensity within the visualized cord is unremarkable. Nerve roots of the cauda equina grossly within normal limits.  Paraspinous soft tissues within normal limits. Visualized visceral structures are unremarkable. No retroperitoneal adenopathy. There is  mild aneurysmal dilatation of the intra-abdominal aorta up to 3.3 cm.  Mild fatty atrophy noted within the paraspinous musculature.  At T10-11 through T12-L1, there is no disc bulge or disc protrusion. No significant canal or foraminal stenosis.  L1-2: Mild disc bulge without focal disc protrusion. There is bilateral facet hypertrophy with ligamentum flavum thickening. There is mild canal stenosis without significant foraminal narrowing.  L2-3: Mild diffuse disc bulge without focal disc protrusion. Mild bilateral facet arthrosis with ligamentum flavum thickening. There is resultant probable moderate canal stenosis, although evaluation somewhat limited due to susceptibility artifact from adjacent hardware. Moderate bilateral foraminal narrowing present as well, worse on the left.  L3-4: Status post spinal fusion. There is probable mild disc bulge with bilateral facet arthrosis. There is resultant moderate canal stenosis. Foramina not well evaluated at this level, however, at least moderate bilateral foraminal narrowing is suspected.  L4-5: Status post spinal fusion. This levels poorly evaluated due to susceptibility artifact.  L5-S1: No significant disc bulge or disc protrusion. No canal stenosis. No definite foraminal narrowing.  IMPRESSION: 1. Bilateral sacral insufficiency fractures without displacement. Finding is better evaluated on concomitant MRI of the pelvis. 2. No other acute abnormality within the lumbar spine. 3. Status post spinal fusion at L4 and L5 without complication. 4. Degenerative disc bulge and facet hypertrophy at L2-3 with resultant moderate canal and bilateral foraminal stenosis, slightly worse on the left. 5. Mild degenerative disc bulge and facet arthrosis at L3-4 with resultant moderate canal stenosis. 6. Mild aneurysmal dilatation of the infrarenal aorta up to 3.3 cm.   Electronically Signed   By: Jeannine Boga M.D.   On: 10/31/2014 22:31   Mr Hip Left Wo Contrast  10/31/2014    CLINICAL DATA:  Status post fall.  Low back pain.  EXAM: MR OF THE LEFT HIP WITHOUT CONTRAST  TECHNIQUE: Multiplanar, multisequence MR imaging was performed. No intravenous contrast was administered.  COMPARISON:  None.  FINDINGS: Bones: No hip fracture, dislocation or avascular necrosis. Marrow edema sacrum bilaterally consistent with sacral insufficiency fractures.  There is no other marrow signal abnormality.  Articular cartilage and labrum  Articular cartilage: Partial-thickness left superior hip cartilage loss. Mild right hip partial thickness cartilage loss.  Labrum:  No gross labral or paralabral abnormality.  Joint or bursal effusion  Joint effusion:  No joint effusion.  Bursae:  No bursa formation.  Muscles and tendons  Flexors: Normal.  Extensors: Normal.  Abductors: Normal.  Adductors: Normal.  Rotators: Normal.  Hamstrings: Very small partial tear is at the hamstring origin bilaterally.  Piriformis: Mild edema of the right piriformis muscle and adjacent  to the right piriformis muscle most consistent with muscle strain.  Other findings  Miscellaneous: Diverticulosis without evidence of diverticulitis. No pelvic free fluid. No inguinal lymphadenopathy. No inguinal hernia.  IMPRESSION: 1. Acute, bilateral nondisplaced sacral insufficiency fractures. 2. No hip fracture, dislocation or avascular necrosis.   Electronically Signed   By: Kathreen Devoid   On: 10/31/2014 21:55   Dg Hip Unilat With Pelvis 2-3 Views Left  10/31/2014   CLINICAL DATA:  Fall 2 weeks ago.  Low back and LEFT hip pain.  EXAM: LEFT HIP (WITH PELVIS) 2-3 VIEWS  COMPARISON:  None.  FINDINGS: Pelvic rings appear intact. Hip joint spaces are preserved. Atherosclerosis. On the lateral view, LEFT hip shows tiny marginal osteophytes compatible with mild osteoarthritis.  IMPRESSION: Mild LEFT hip osteoarthritis.  No acute osseous abnormality.   Electronically Signed   By: Dereck Ligas M.D.   On: 10/31/2014 14:35    Review of Systems  Unable  to perform ROS: dementia   Blood pressure 132/57, pulse 64, temperature 98.9 F (37.2 C), temperature source Oral, resp. rate 16, height '5\' 4"'  (1.626 m), weight 59.4 kg (130 lb 15.3 oz), SpO2 92 %. Physical Exam  Constitutional: She appears well-developed.  HENT:  Head: Atraumatic.  Eyes: EOM are normal.  Cardiovascular: Intact distal pulses.   Respiratory: Effort normal.  Musculoskeletal:  Tender to palpation mid sacrum Bilateral lower extremities strength and sensation intact.  Neurological: She is alert.  Skin: Skin is warm and dry.  Psychiatric: She has a normal mood and affect.    Assessment/Plan: Bilateral sacral insufficiency fractures in an elderly female Treatment is conservative, no surgery is necessary Recommend bed rest and protected weight bearing with a walker Medications which may help include Ca/Vit D, bisphosphonates and calcitonin These can remain symptomatic for upto 6-9 months. This is a problem which can be adequately handled by her primary care physician, but if she wishes to follow in our office, Dr. Grandville Silos can see her back in 2-3 wks.  Jacqueline Robles 11/01/2014, 8:24 AM

## 2014-11-01 NOTE — Evaluation (Signed)
Physical Therapy Evaluation Patient Details Name: Jacqueline Robles MRN: [id number] DOB: [birth year] Today's Date: 11/01/2014   History of Present Illness  81 yo female admitted with bil sacral fxs. Hx of HTN, osteoporosis, macular degeneration.   Clinical Impression  On eval, pt required Min assist for mobility-able to perform stand pivot x 2 with RW. Difficulty WBing noted during transfers. No family present during session. May need to consider ST rehab at SNF unless family is able to provide current level of care and 24 hour supervision.Do not feel pt will be able to tolerate climbing steps to enter home so ambulance transport may be best choice.     Follow Up Recommendations Home health PT;Supervision/Assistance - 24 hour (as long as family can provide care. May need to consider [facility]. )    Equipment Recommendations  Rolling walker with 5" wheels;Lightweight Wheelchair ;Wheelchair cushion ;3in1    Recommendations for Other Services       Precautions / Restrictions Precautions Precautions: Fall Restrictions Weight Bearing Restrictions: Yes RLE Weight Bearing: Weight bearing as tolerated LLE Weight Bearing: Weight bearing as tolerated Other Position/Activity Restrictions: with walker      Mobility  Bed Mobility Overal bed mobility: Needs Assistance Bed Mobility: Supine to Sit     Supine to sit: Min assist     General bed mobility comments: Assist for trunk. Increased time.   Transfers Overall transfer level: Needs assistance Equipment used: Rolling walker (2 wheeled) Transfers: Sit to/from Stand Sit to Stand: Min assist Stand pivot: Min assist         General transfer comment: Assist to rise, stabilize, control descent. Stand pivot from bed to [facility], then [facility] to recliner with RW. Multimodal cues for safe use of RW  Ambulation/Gait             General Gait Details: NT  Stairs            Wheelchair Mobility    Modified Rankin (Stroke Patients  Only)       Balance Overall balance assessment: History of Falls;Needs assistance         Standing balance support: Bilateral upper extremity supported;During functional activity Standing balance-Leahy Scale: Poor                               Pertinent Vitals/Pain Pain Assessment: Faces Faces Pain Scale: Hurts even more Pain Location: LEs with WBing activity Pain Descriptors / Indicators: Sore Pain Intervention(s): Monitored during session;Repositioned    Home Living Family/patient expects to be discharged to:: Private residence Living Arrangements: Children Available Help at Discharge: Family Type of Home: Apartment Home Access: Stairs to enter   Technical brewer of Steps: 1   Home Equipment: Cane - single point Additional Comments: unsure of PLOF/home environment info accuracy-info is per pt. No family present to provide info.    Prior Function                 Hand Dominance        Extremity/Trunk Assessment   Upper Extremity Assessment: Generalized weakness           Lower Extremity Assessment: Generalized weakness      Cervical / Trunk Assessment: Kyphotic  Communication   Communication: HOH  Cognition Arousal/Alertness: Awake/alert Behavior During Therapy: WFL for tasks assessed/performed Overall Cognitive Status: No family/caregiver present to determine baseline cognitive functioning  General Comments      Exercises        Assessment/Plan    PT Assessment Patient needs continued PT services  PT Diagnosis Difficulty walking;Acute pain;Abnormality of gait   PT Problem List Decreased activity tolerance;Decreased balance;Decreased mobility;Pain;Decreased strength;Decreased knowledge of use of DME;Decreased cognition;Decreased safety awareness;Decreased knowledge of precautions  PT Treatment Interventions DME instruction;Gait training;Functional mobility training;Therapeutic  activities;Therapeutic exercise;Patient/family education;Balance training   PT Goals (Current goals can be found in the Care Plan section) Acute Rehab PT Goals Patient Stated Goal: none stated PT Goal Formulation: Patient unable to participate in goal setting Time For Goal Achievement: 11/15/14 Potential to Achieve Goals: Fair    Frequency Min 3X/week   Barriers to discharge        Co-evaluation               End of Session Equipment Utilized During Treatment: Gait belt Activity Tolerance: Patient limited by pain Patient left: in chair;with call bell/phone within reach;with chair alarm set           Time: 0321-2248 PT Time Calculation (min) (ACUTE ONLY): 25 min   Charges:   PT Evaluation $Initial PT Evaluation Tier I: 1 Procedure PT Treatments $Therapeutic Activity: 8-22 mins   PT G Codes:        Weston Anna, MPT Pager: [phone]

## 2014-11-01 NOTE — H&P (Signed)
[facility] History and Physical  [place] NWG:[id number] DOB: November 28, 1921 DOA: 10/31/2014  Referring physician: ER physician. PCP: Mathews Argyle, MD   History obtained from patient's son.  Chief Complaint: Fall.  HPI: Jacqueline Robles is a 81 y.o. female with history of severe aortic stenosis, hyperlipidemia, hypothyroidism was brought to the ER after patient had persistent low back pain. Patient had a fall 2 weeks ago. As per patient's son patient was getting out of the bathroom and she slipped and fell backwards. Patient did not hit her head or lose consciousness. Denies any chest pain or palpitation or shortness of breath during the episode. But since then patient has been having low back pain which is gradually worsening. In the ER patient had MRI of the L-spine and left hip which shows bilateral acute sacral insufficiency fracture. On-call orthopedic surgeon was consulted by the ER physician and at this time they have requested nonsurgical management with nonweightbearing. Patient on exam is not in distress but has significant pain on ambulation. Patient usually walks with help of walker and cane.  Review of Systems: As presented in the history of presenting illness, rest negative.  Past Medical History  Diagnosis Date  . Hypertension   . Thyroid disease   . Osteoporosis   . Depression   . Hyperlipidemia    Past Surgical History  Procedure Laterality Date  . Neck surgery    . Foot surgery     Social History:  reports that she has never smoked. She does not have any smokeless tobacco history on file. She reports that she does not drink alcohol or use illicit drugs. Where does patient live at home. Can patient participate in ADLs? Not sure.  Allergies  Allergen Reactions  . Fosamax [Alendronate Sodium]   . Hctz [Hydrochlorothiazide]   . Lanolin   . Latex   . Neosporin [Neomycin-Bacitracin Zn-Polymyx]   . Norvasc [Amlodipine Besylate]   . Polysporin  [Bacitracin-Polymyxin B]     rash    Family History:  Family History  Problem Relation Age of Onset  . Hypertension Son       Prior to Admission medications   Medication Sig Start Date End Date Taking? Authorizing Provider  calcium-vitamin D (OSCAL WITH D) 500-200 MG-UNIT per tablet Take 1 tablet by mouth daily with breakfast.   Yes Historical Provider, MD  clopidogrel (PLAVIX) 75 MG tablet Take 75 mg by mouth daily.   Yes Historical Provider, MD  eszopiclone (LUNESTA) 2 MG TABS tablet Take 2 mg by mouth at bedtime. Take immediately before bedtime   Yes Historical Provider, MD  fexofenadine (ALLEGRA) 180 MG tablet Take 180 mg by mouth daily.   Yes Historical Provider, MD  fluticasone (FLONASE) 50 MCG/ACT nasal spray Place into both nostrils daily.   Yes Historical Provider, MD  levothyroxine (SYNTHROID, LEVOTHROID) 75 MCG tablet Take 75 mcg by mouth daily before breakfast.   Yes Historical Provider, MD  LORazepam (ATIVAN) 0.5 MG tablet Take 0.5 mg by mouth every 8 (eight) hours as needed for anxiety.    Yes Historical Provider, MD  Melatonin-Pyridoxine (MELATIN PO) Take 1 tablet by mouth daily as needed (sleep if sleeping medication needs refills).    Yes Historical Provider, MD  montelukast (SINGULAIR) 10 MG tablet Take 10 mg by mouth at bedtime.   Yes Historical Provider, MD  Multiple Vitamins-Minerals (PRESERVISION AREDS PO) Take 1 tablet by mouth 2 (two) times daily.    Yes Historical Provider, MD  simvastatin (ZOCOR) 20 MG  tablet Take 20 mg by mouth daily.   Yes Historical Provider, MD    Physical Exam: Filed Vitals:   10/31/14 1255 10/31/14 1619 10/31/14 1903 10/31/14 2220  BP: 115/69 126/61 115/63 153/52  Pulse: 58 60 70 61  Temp: 98.6 F (37 C) 98.4 F (36.9 C) 98.2 F (36.8 C)   TempSrc: Oral Oral Oral   Resp: 18 18 18 18   SpO2: 93% 95% 94% 93%     General:  Moderately built and nourished.  Eyes: Anicteric no pallor.  ENT: No discharge from the ears eyes nose or  mouth.  Neck: No mass felt.  Cardiovascular: S1 and S2 heard.  Respiratory: No rhonchi or crepitations.  Abdomen: Soft nontender bowel sounds present.  Skin: No rash.  Musculoskeletal: No edema.  Psychiatric: Appears normal.  Neurologic: Alert awake oriented to time place and person. Moves all extremities.  Labs on Admission:  Basic Metabolic Panel:  Recent Labs Lab 10/31/14 1809  NA 138  K 4.3  CL 106  CO2 26  GLUCOSE 86  BUN 18  CREATININE 0.93  CALCIUM 8.7   Liver Function Tests:  Recent Labs Lab 10/31/14 1809  AST 21  ALT 13  ALKPHOS 60  BILITOT 0.7  PROT 6.0  ALBUMIN 3.1*   No results for input(s): LIPASE, AMYLASE in the last 168 hours. No results for input(s): AMMONIA in the last 168 hours. CBC:  Recent Labs Lab 10/31/14 1809  WBC 5.9  NEUTROABS 3.7  HGB 10.3*  HCT 31.7*  MCV 95.5  PLT 156   Cardiac Enzymes: No results for input(s): CKTOTAL, CKMB, CKMBINDEX, TROPONINI in the last 168 hours.  BNP (last 3 results) No results for input(s): BNP in the last 8760 hours.  ProBNP (last 3 results) No results for input(s): PROBNP in the last 8760 hours.  CBG: No results for input(s): GLUCAP in the last 168 hours.  Radiological Exams on Admission: Dg Lumbar Spine Complete  10/31/2014   CLINICAL DATA:  Fall 2 weeks ago.  Low back and LEFT hip pain.  EXAM: LUMBAR SPINE - COMPLETE 4+ VIEW  COMPARISON:  None.  FINDINGS: L4-L5 posterior lumbar interbody fusion. Radiopaque leads are present over the fusion, likely for prior bones stimulator. No power pack is present. There is adjacent segment disease at L3-L4 with loss of the disc space and endplate sclerosis. Vertebral body height is preserved.  IMPRESSION: No acute osseous abnormality. L4-L5 fusion appears solid with severe L3-L4 adjacent segment degenerative disc disease.   Electronically Signed   By: Dereck Ligas M.D.   On: 10/31/2014 14:36   Mr Lumbar Spine Wo Contrast  10/31/2014   CLINICAL  DATA:  Initial evaluation of low back pain and hip pain status post recent fall 2 weeks ago.  EXAM: MRI LUMBAR SPINE WITHOUT CONTRAST  TECHNIQUE: Multiplanar, multisequence MR imaging of the lumbar spine was performed. No intravenous contrast was administered.  COMPARISON:  Concomitant MRI the pelvis as well as previous radiographs from 06/25/2013.  FINDINGS: For the purposes of this dictation, the lowest well-formed intervertebral disc space is presumed to be the L5-S1 level, and there presumed to be 5 lumbar type vertebral bodies.  Patient is status post spinal fusion at L4-5 with extensive susceptibility artifact present within the lumbar spine in this region.  Vertebral body heights are preserved. No lumbar fracture. There is suspected trace anterolisthesis of L3 on L4. Vertebral bodies are otherwise normally aligned.  Abnormal T1 hypo intense, T2/STIR hyperintense signal intensity seen within the  bilateral sacral ala, compatible with insufficiency fractures. These are nondisplaced. Otherwise, signal intensity within the vertebral body bone marrow is normal.  Conus medullaris terminates normally at the L1 level. Signal intensity within the visualized cord is unremarkable. Nerve roots of the cauda equina grossly within normal limits.  Paraspinous soft tissues within normal limits. Visualized visceral structures are unremarkable. No retroperitoneal adenopathy. There is mild aneurysmal dilatation of the intra-abdominal aorta up to 3.3 cm.  Mild fatty atrophy noted within the paraspinous musculature.  At T10-11 through T12-L1, there is no disc bulge or disc protrusion. No significant canal or foraminal stenosis.  L1-2: Mild disc bulge without focal disc protrusion. There is bilateral facet hypertrophy with ligamentum flavum thickening. There is mild canal stenosis without significant foraminal narrowing.  L2-3: Mild diffuse disc bulge without focal disc protrusion. Mild bilateral facet arthrosis with ligamentum  flavum thickening. There is resultant probable moderate canal stenosis, although evaluation somewhat limited due to susceptibility artifact from adjacent hardware. Moderate bilateral foraminal narrowing present as well, worse on the left.  L3-4: Status post spinal fusion. There is probable mild disc bulge with bilateral facet arthrosis. There is resultant moderate canal stenosis. Foramina not well evaluated at this level, however, at least moderate bilateral foraminal narrowing is suspected.  L4-5: Status post spinal fusion. This levels poorly evaluated due to susceptibility artifact.  L5-S1: No significant disc bulge or disc protrusion. No canal stenosis. No definite foraminal narrowing.  IMPRESSION: 1. Bilateral sacral insufficiency fractures without displacement. Finding is better evaluated on concomitant MRI of the pelvis. 2. No other acute abnormality within the lumbar spine. 3. Status post spinal fusion at L4 and L5 without complication. 4. Degenerative disc bulge and facet hypertrophy at L2-3 with resultant moderate canal and bilateral foraminal stenosis, slightly worse on the left. 5. Mild degenerative disc bulge and facet arthrosis at L3-4 with resultant moderate canal stenosis. 6. Mild aneurysmal dilatation of the infrarenal aorta up to 3.3 cm.   Electronically Signed   By: Jeannine Boga M.D.   On: 10/31/2014 22:31   Mr Hip Left Wo Contrast  10/31/2014   CLINICAL DATA:  Status post fall.  Low back pain.  EXAM: MR OF THE LEFT HIP WITHOUT CONTRAST  TECHNIQUE: Multiplanar, multisequence MR imaging was performed. No intravenous contrast was administered.  COMPARISON:  None.  FINDINGS: Bones: No hip fracture, dislocation or avascular necrosis. Marrow edema sacrum bilaterally consistent with sacral insufficiency fractures.  There is no other marrow signal abnormality.  Articular cartilage and labrum  Articular cartilage: Partial-thickness left superior hip cartilage loss. Mild right hip partial  thickness cartilage loss.  Labrum:  No gross labral or paralabral abnormality.  Joint or bursal effusion  Joint effusion:  No joint effusion.  Bursae:  No bursa formation.  Muscles and tendons  Flexors: Normal.  Extensors: Normal.  Abductors: Normal.  Adductors: Normal.  Rotators: Normal.  Hamstrings: Very small partial tear is at the hamstring origin bilaterally.  Piriformis: Mild edema of the right piriformis muscle and adjacent to the right piriformis muscle most consistent with muscle strain.  Other findings  Miscellaneous: Diverticulosis without evidence of diverticulitis. No pelvic free fluid. No inguinal lymphadenopathy. No inguinal hernia.  IMPRESSION: 1. Acute, bilateral nondisplaced sacral insufficiency fractures. 2. No hip fracture, dislocation or avascular necrosis.   Electronically Signed   By: Kathreen Devoid   On: 10/31/2014 21:55   Dg Hip Unilat With Pelvis 2-3 Views Left  10/31/2014   CLINICAL DATA:  Fall 2 weeks ago.  Low back and LEFT hip pain.  EXAM: LEFT HIP (WITH PELVIS) 2-3 VIEWS  COMPARISON:  None.  FINDINGS: Pelvic rings appear intact. Hip joint spaces are preserved. Atherosclerosis. On the lateral view, LEFT hip shows tiny marginal osteophytes compatible with mild osteoarthritis.  IMPRESSION: Mild LEFT hip osteoarthritis.  No acute osseous abnormality.   Electronically Signed   By: Dereck Ligas M.D.   On: 10/31/2014 14:35    EKG: Independently reviewed. Normal sinus rhythm with nonspecific ST-T changes.  Assessment/Plan Principal Problem:   Sacral fracture, closed Active Problems:   Aortic stenosis   Hypothyroidism   Hyperlipidemia   UTI (lower urinary tract infection)   Sacral insufficiency fracture   1. Bilateral acute sacral insufficiency fracture - as per the on-call orthopedic surgeon this is to be managed nonsurgically. I have consulted physical therapy. Continue with pain medications. Patient probably may need rehabilitation. 2. UTI - patient is placed on  ceftriaxone. Follow urine cultures. 3. Fall - not sure of the cause. We will monitor overnight in telemetry to rule out any arrhythmias. 4. Severe aortic stenosis - patient had 2-D echo last year which showed severe aortic stenosis and is being managed and closely followed by primary care physician as per patient's son. 5. Hyperlipidemia - continue present medications. 6. Hypothyroidism - on Synthroid. 7. History of macular degeneration.  Patient is on Plavix and patient's son does not show why she is on Plavix.   DVT Prophylaxis Lovenox.  Code Status: DO NOT RESUSCITATE.  Family Communication: Patient's son at the bedside.  Disposition Plan: Admit to inpatient.    [place] Edwin N. [facility] Pager [phone].  If 7PM-7AM, please contact night-coverage [email or web address] Password [email or web address] 11/01/2014, 12:12 AM

## 2014-11-01 NOTE — Progress Notes (Signed)
ANTIBIOTIC CONSULT NOTE - INITIAL  Pharmacy Consult for Ceftriaxone Indication: UTI  Allergies  Allergen Reactions  . Fosamax [Alendronate Sodium]   . Hctz [Hydrochlorothiazide]   . Lanolin   . Latex   . Neosporin [Neomycin-Bacitracin Zn-Polymyx]   . Norvasc [Amlodipine Besylate]   . Polysporin [Bacitracin-Polymyxin B]     rash    Patient Measurements: Height: 5\' 4"  (162.6 cm) Weight: 130 lb 15.3 oz (59.4 kg) IBW/kg (Calculated) : 54.7 Adjusted Body Weight:   Vital Signs: Temp: 98.4 F (36.9 C) (03/04 0032) Temp Source: Oral (03/04 0032) BP: 146/108 mmHg (03/04 0032) Pulse Rate: 70 (03/04 0032) Intake/Output from previous day:   Intake/Output from this shift:    Labs:  Recent Labs  10/31/14 1809  WBC 5.9  HGB 10.3*  PLT 156  CREATININE 0.93   Estimated Creatinine Clearance: 34.7 mL/min (by C-G formula based on Cr of 0.93). No results for input(s): VANCOTROUGH, VANCOPEAK, VANCORANDOM, GENTTROUGH, GENTPEAK, GENTRANDOM, TOBRATROUGH, TOBRAPEAK, TOBRARND, AMIKACINPEAK, AMIKACINTROU, AMIKACIN in the last 72 hours.   Microbiology: No results found for this or any previous visit (from the past 720 hour(s)).  Medical History: Past Medical History  Diagnosis Date  . Hypertension   . Thyroid disease   . Osteoporosis   . Depression   . Hyperlipidemia     Medications:  Anti-infectives    Start     Dose/Rate Route Frequency Ordered Stop   10/31/14 2330  cefTRIAXone (ROCEPHIN) 1 g in dextrose 5 % 50 mL IVPB     1 g 100 mL/hr over 30 Minutes Intravenous  Once 10/31/14 2326 11/01/14 0034   10/31/14 2115  cefTRIAXone (ROCEPHIN) injection 250 mg     250 mg Intramuscular  Once 10/31/14 2108 10/31/14 2218     Assessment: Patient fell two weeks ago at home in ED with back/hip pain.  Noted to have UTI as well.  First dose of antibiotics already given.  Goal of Therapy:  Rocephin based on manufacturer dosing recommendations.   Plan:Ceftriaxone 1gm iv q24hr   Follow up culture results  Nani Skillern Crowford 11/01/2014,2:26 AM

## 2014-11-01 NOTE — Care Management Note (Addendum)
    Page 1 of 1   11/05/2014     1:18:32 PM CARE MANAGEMENT NOTE 11/05/2014  Patient:  Jacqueline Robles, Jacqueline Robles   Account Number:  [id number]  Date Initiated:  11/01/2014  Documentation initiated by:  Dessa Phi  Subjective/Objective Assessment:   81 y/o f admitted w/fall, uti.     Action/Plan:   From home w/son.   Anticipated DC Date:  11/05/2014   Anticipated DC Plan:  [place]  CM consult      Choice offered to / List presented to:  C-4 Adult Children           Status of service:  Completed, signed off Medicare Important Message given?  YES (If response is "NO", the following Medicare IM given date fields will be blank) Date Medicare IM given:  11/04/2014 Medicare IM given by:  [facility] Date Additional Medicare IM given:   Additional Medicare IM given by:    Discharge Disposition:  [place]  Per UR Regulation:  Reviewed for med. necessity/level of care/duration of stay  If discussed at [place] of Stay Meetings, dates discussed:   11/05/2014    Comments:  11/05/14 [place] [phone] d/c snf.  11/01/14 Dessa Phi RN BSN NCM [phone] spoke to son Elta [place] [phone], agree to snf. CSW already following. PT-HH.Will provide [facility] agency list as resource.Await choice.[place] orders placed.

## 2014-11-01 NOTE — Progress Notes (Signed)
Spoke with son Antonae Zbikowski at bedside and daughter Georgann Housekeeper on phone.  They would prefer SNF if possible.  Consult social work to assist. Research officer, trade union

## 2014-11-01 NOTE — Progress Notes (Signed)
RN notified the Attending doctor for a POC discussion. The MD called the RN stating that the patient would be treated for the UTI; Home Health would come to patient's [place] for any therapy needs. PT to do an eval tomorrow morning.  Will continue to monitor the patient.

## 2014-11-01 NOTE — Progress Notes (Addendum)
Clinical Social Work Department CLINICAL SOCIAL WORK PLACEMENT NOTE 11/01/2014  Patient:  [name], [name]  Account Number:  [id number] Admit date:  10/31/2014  Clinical Social Worker:  Maryln Manuel  Date/time:  11/01/2014 03:30 PM  Clinical Social Work is seeking post-discharge placement for this patient at the following level of care:   SKILLED NURSING   (*CSW will update this form in Epic as items are completed)   11/01/2014  Patient/family provided with [place] Department of Clinical Social Work's list of facilities offering this level of care within the geographic area requested by the patient (or if unable, by the patient's family).  11/01/2014  Patient/family informed of their freedom to choose among providers that offer the needed level of care, that participate in Medicare, Medicaid or managed care program needed by the patient, have an available bed and are willing to accept the patient.  11/01/2014  Patient/family informed of [facility]' ownership interest in [facility], as well as of the fact that they are under no obligation to receive care at this facility.  PASARR submitted to EDS on 11/01/2014 PASARR number received on 11/01/2014  FL2 transmitted to all facilities in geographic area requested by pt/family on  11/01/2014 FL2 transmitted to all facilities within larger geographic area on   Patient informed that his/her managed care company has contracts with or will negotiate with  certain facilities, including the following:     Patient/family informed of bed offers received:   Patient chooses bed at  Physician recommends and patient chooses bed at    Patient to be transferred to  on   Patient to be transferred to facility by  Patient and family notified of transfer on  Name of family member notified:    The following physician request were entered in Epic:   Additional Comments:   Alison Murray, MSW, LCSW Clinical Social Work Coverage for  [employer] [place]

## 2014-11-02 DIAGNOSIS — S3210XK Unspecified fracture of sacrum, subsequent encounter for fracture with nonunion: Secondary | ICD-10-CM

## 2014-11-02 DIAGNOSIS — M8448XS Pathological fracture, other site, sequela: Secondary | ICD-10-CM

## 2014-11-02 LAB — VITAMIN D 25 HYDROXY (VIT D DEFICIENCY, FRACTURES): Vit D, 25-Hydroxy: 45.3 ng/mL (ref 30.0–100.0)

## 2014-11-02 MED ORDER — HALOPERIDOL LACTATE 5 MG/ML IJ SOLN
2.0000 mg | Freq: Four times a day (QID) | INTRAMUSCULAR | Status: DC | PRN
  Filled 2014-11-02: qty 1

## 2014-11-02 MED ORDER — HYDROCODONE-ACETAMINOPHEN 5-325 MG PO TABS: 1.0000 | ORAL_TABLET | Freq: Four times a day (QID) | ORAL | Status: DC | PRN

## 2014-11-02 MED ORDER — CALCIUM CARBONATE-VITAMIN D 500-200 MG-UNIT PO TABS: 1.0000 | ORAL_TABLET | Freq: Two times a day (BID) | ORAL | Status: DC

## 2014-11-02 MED ORDER — CALCIUM CARBONATE-VITAMIN D 500-200 MG-UNIT PO TABS
1.0000 | ORAL_TABLET | Freq: Two times a day (BID) | ORAL | Status: DC
  Administered 2014-11-02 – 2014-11-05 (×6): 1 via ORAL
  Filled 2014-11-02 (×7): qty 1

## 2014-11-02 MED ORDER — LORAZEPAM 2 MG/ML IJ SOLN
0.5000 mg | Freq: Once | INTRAMUSCULAR | Status: AC
  Administered 2014-11-02: 0.5 mg via INTRAVENOUS
  Filled 2014-11-02: qty 1

## 2014-11-02 MED ORDER — CEFPODOXIME PROXETIL 200 MG PO TABS
200.0000 mg | ORAL_TABLET | Freq: Two times a day (BID) | ORAL | Status: DC
  Administered 2014-11-02: 200 mg via ORAL
  Filled 2014-11-02 (×2): qty 1

## 2014-11-02 NOTE — Progress Notes (Signed)
PROGRESS NOTE  Dickens TGG:[id number] DOB: April 27, 1922 DOA: 10/31/2014 PCP: Mathews Argyle, MD  Brief history 81 year old female with a history of aortic stenosis, hyperlipidemia, hypothyroidism presented with worsening back pain. The patient had a mechanical fall getting out of the bathroom 2 weeks prior to the admission. There was no loss of consciousness. Because of worsening low back pain the patient was brought to the emergency department. MRI of the lumbar spine was negative for any fractures but did show moderate spinal stenosis L2-3, L3-4 as well as bilateral sacral insufficiency fractures.[name] MRI of the hips confirmed bilateral nondisplaced sacral insufficiency fractures. There was also partial tears of her bilateral hamstrings. Assessment/Plan: Bilateral sacral insufficiency fractures -Discussed with orthopedics, Dr. Domenic Schwab always with walker-->no surgical intervention -PT evaluation -Control pain-->minimal-->d/c morphine -norco prn pain -Check 25 vitamin D--45.3 -start Oscal+D Pyuria/Bacteruria -Concern about UTI--urine culture with multiple organisms -d/c ceftriaxone -start po cepodoxime Gait instability, mechanical falls  -PT evaluation-->SNF Aortic stenosis  -07/22/2014 echocardiogram EF 70-35%, grade 1 diastolic dysfunction, moderate to severe aortic stenosis  -Asymptomatic at this time  hyperlipidemia  -Continue statin  Hypothyroidism  -Continue Synthroid  Normocytic anemia -stable for outpt followup -recheck cbc am Night time aggitation -likely sundowning -d/c ambien -haldol prn aggitation  Family Communication: Pt at beside Disposition Plan: SNF 3/7/16a   Procedures/Studies: Dg Lumbar Spine Complete  10/31/2014   CLINICAL DATA:  Fall 2 weeks ago.  Low back and LEFT hip pain.  EXAM: LUMBAR SPINE - COMPLETE 4+ VIEW  COMPARISON:  None.  FINDINGS: L4-L5 posterior lumbar interbody fusion. Radiopaque leads are present over the  fusion, likely for prior bones stimulator. No power pack is present. There is adjacent segment disease at L3-L4 with loss of the disc space and endplate sclerosis. Vertebral body height is preserved.  IMPRESSION: No acute osseous abnormality. L4-L5 fusion appears solid with severe L3-L4 adjacent segment degenerative disc disease.   Electronically Signed   By: Dereck Ligas M.D.   On: 10/31/2014 14:36   Ct Head Wo Contrast  11/01/2014   CLINICAL DATA:  Status post fall, with acute onset of confusion. Initial encounter.  EXAM: CT HEAD WITHOUT CONTRAST  TECHNIQUE: Contiguous axial images were obtained from the base of the skull through the vertex without intravenous contrast.  COMPARISON:  MRI of the brain performed 11/05/2003  FINDINGS: There is no evidence of acute infarction, mass lesion, or intra- or extra-axial hemorrhage on CT.  Prominence of the ventricles and sulci reflects moderate cortical volume loss. Cerebellar atrophy is noted. Scattered periventricular and subcortical white matter change likely reflects small vessel ischemic microangiopathy.  The brainstem and fourth ventricle are within normal limits. The basal ganglia are unremarkable in appearance. The cerebral hemispheres demonstrate grossly normal gray-white differentiation. No mass effect or midline shift is seen.  There is no evidence of fracture; visualized osseous structures are unremarkable in appearance. The visualized portions of the orbits are within normal limits. The paranasal sinuses and mastoid air cells are well-aerated. No significant soft tissue abnormalities are seen.  IMPRESSION: 1. No acute intracranial pathology seen on CT. 2. Moderate cortical volume loss and scattered small vessel ischemic microangiopathy.   Electronically Signed   By: Garald Balding M.D.   On: 11/01/2014 00:32   Mr Lumbar Spine Wo Contrast  10/31/2014   CLINICAL DATA:  Initial evaluation of low back pain and hip pain status post recent fall 2 weeks ago.   EXAM: MRI LUMBAR SPINE  WITHOUT CONTRAST  TECHNIQUE: Multiplanar, multisequence MR imaging of the lumbar spine was performed. No intravenous contrast was administered.  COMPARISON:  Concomitant MRI the pelvis as well as previous radiographs from 06/25/2013.  FINDINGS: For the purposes of this dictation, the lowest well-formed intervertebral disc space is presumed to be the L5-S1 level, and there presumed to be 5 lumbar type vertebral bodies.  Patient is status post spinal fusion at L4-5 with extensive susceptibility artifact present within the lumbar spine in this region.  Vertebral body heights are preserved. No lumbar fracture. There is suspected trace anterolisthesis of L3 on L4. Vertebral bodies are otherwise normally aligned.  Abnormal T1 hypo intense, T2/STIR hyperintense signal intensity seen within the bilateral sacral ala, compatible with insufficiency fractures. These are nondisplaced. Otherwise, signal intensity within the vertebral body bone marrow is normal.  Conus medullaris terminates normally at the L1 level. Signal intensity within the visualized cord is unremarkable. Nerve roots of the cauda equina grossly within normal limits.  Paraspinous soft tissues within normal limits. Visualized visceral structures are unremarkable. No retroperitoneal adenopathy. There is mild aneurysmal dilatation of the intra-abdominal aorta up to 3.3 cm.  Mild fatty atrophy noted within the paraspinous musculature.  At T10-11 through T12-L1, there is no disc bulge or disc protrusion. No significant canal or foraminal stenosis.  L1-2: Mild disc bulge without focal disc protrusion. There is bilateral facet hypertrophy with ligamentum flavum thickening. There is mild canal stenosis without significant foraminal narrowing.  L2-3: Mild diffuse disc bulge without focal disc protrusion. Mild bilateral facet arthrosis with ligamentum flavum thickening. There is resultant probable moderate canal stenosis, although evaluation  somewhat limited due to susceptibility artifact from adjacent hardware. Moderate bilateral foraminal narrowing present as well, worse on the left.  L3-4: Status post spinal fusion. There is probable mild disc bulge with bilateral facet arthrosis. There is resultant moderate canal stenosis. Foramina not well evaluated at this level, however, at least moderate bilateral foraminal narrowing is suspected.  L4-5: Status post spinal fusion. This levels poorly evaluated due to susceptibility artifact.  L5-S1: No significant disc bulge or disc protrusion. No canal stenosis. No definite foraminal narrowing.  IMPRESSION: 1. Bilateral sacral insufficiency fractures without displacement. Finding is better evaluated on concomitant MRI of the pelvis. 2. No other acute abnormality within the lumbar spine. 3. Status post spinal fusion at L4 and L5 without complication. 4. Degenerative disc bulge and facet hypertrophy at L2-3 with resultant moderate canal and bilateral foraminal stenosis, slightly worse on the left. 5. Mild degenerative disc bulge and facet arthrosis at L3-4 with resultant moderate canal stenosis. 6. Mild aneurysmal dilatation of the infrarenal aorta up to 3.3 cm.   Electronically Signed   By: Jeannine Boga M.D.   On: 10/31/2014 22:31   Mr Hip Left Wo Contrast  10/31/2014   CLINICAL DATA:  Status post fall.  Low back pain.  EXAM: MR OF THE LEFT HIP WITHOUT CONTRAST  TECHNIQUE: Multiplanar, multisequence MR imaging was performed. No intravenous contrast was administered.  COMPARISON:  None.  FINDINGS: Bones: No hip fracture, dislocation or avascular necrosis. Marrow edema sacrum bilaterally consistent with sacral insufficiency fractures.  There is no other marrow signal abnormality.  Articular cartilage and labrum  Articular cartilage: Partial-thickness left superior hip cartilage loss. Mild right hip partial thickness cartilage loss.  Labrum:  No gross labral or paralabral abnormality.  Joint or bursal  effusion  Joint effusion:  No joint effusion.  Bursae:  No bursa formation.  Muscles and tendons  Flexors: Normal.  Extensors: Normal.  Abductors: Normal.  Adductors: Normal.  Rotators: Normal.  Hamstrings: Very small partial tear is at the hamstring origin bilaterally.  Piriformis: Mild edema of the right piriformis muscle and adjacent to the right piriformis muscle most consistent with muscle strain.  Other findings  Miscellaneous: Diverticulosis without evidence of diverticulitis. No pelvic free fluid. No inguinal lymphadenopathy. No inguinal hernia.  IMPRESSION: 1. Acute, bilateral nondisplaced sacral insufficiency fractures. 2. No hip fracture, dislocation or avascular necrosis.   Electronically Signed   By: Kathreen Devoid   On: 10/31/2014 21:55   Dg Hip Unilat With Pelvis 2-3 Views Left  10/31/2014   CLINICAL DATA:  Fall 2 weeks ago.  Low back and LEFT hip pain.  EXAM: LEFT HIP (WITH PELVIS) 2-3 VIEWS  COMPARISON:  None.  FINDINGS: Pelvic rings appear intact. Hip joint spaces are preserved. Atherosclerosis. On the lateral view, LEFT hip shows tiny marginal osteophytes compatible with mild osteoarthritis.  IMPRESSION: Mild LEFT hip osteoarthritis.  No acute osseous abnormality.   Electronically Signed   By: Dereck Ligas M.D.   On: 10/31/2014 14:35         Subjective: Patient denies chest pain, chest breath, vomiting, diarrhea, abdominal pain. No fevers or chills.  Objective: Filed Vitals:   11/01/14 1415 11/01/14 2030 11/02/14 0625 11/02/14 1410  BP: 152/62 161/85 155/40 157/68  Pulse: 68 65 62 66  Temp: 97.9 F (36.6 C) 98.9 F (37.2 C) 97.5 F (36.4 C) 98.5 F (36.9 C)  TempSrc: Oral Oral Axillary Oral  Resp: 18 18 20 18   Height:      Weight:      SpO2: 100% 97%  98%    Intake/Output Summary (Last 24 hours) at 11/02/14 1651 Last data filed at 11/02/14 1006  Gross per 24 hour  Intake    120 ml  Output      0 ml  Net    120 ml   Weight change:  Exam:   General:  Pt is  alert, follows commands appropriately, not in acute distress  HEENT: No icterus, No thrush,[place]/AT  Cardiovascular: RRR, S1/S2, no rubs, no gallops 2/6 systolic murmur LUSB  Respiratory: CTA bilaterally, no wheezing, no crackles, no rhonchi  Abdomen: Soft/+BS, non tender, non distended, no guarding  Extremities: No edema, No lymphangitis, No petechiae, No rashes, no synovitis  Data Reviewed: Basic Metabolic Panel:  Recent Labs Lab 10/31/14 1809 11/01/14 0400  NA 138 137  K 4.3 4.2  CL 106 105  CO2 26 24  GLUCOSE 86 92  BUN 18 16  CREATININE 0.93 0.93  CALCIUM 8.7 8.6   Liver Function Tests:  Recent Labs Lab 10/31/14 1809 11/01/14 0400  AST 21 19  ALT 13 12  ALKPHOS 60 58  BILITOT 0.7 0.8  PROT 6.0 5.9*  ALBUMIN 3.1* 3.0*   No results for input(s): LIPASE, AMYLASE in the last 168 hours. No results for input(s): AMMONIA in the last 168 hours. CBC:  Recent Labs Lab 10/31/14 1809 11/01/14 0400  WBC 5.9 5.6  NEUTROABS 3.7 3.8  HGB 10.3* 10.2*  HCT 31.7* 30.6*  MCV 95.5 95.6  PLT 156 167   Cardiac Enzymes:  Recent Labs Lab 11/01/14 0400  TROPONINI 0.03   BNP: Invalid input(s): POCBNP CBG: No results for input(s): GLUCAP in the last 168 hours.  Recent Results (from the past 240 hour(s))  Urine culture     Status: None   Collection Time: 10/31/14  7:10 PM  Result  Value Ref Range Status   Specimen Description URINE, RANDOM  Final   Special Requests NONE  Final   Colony Count   Final    >=100,000 COLONIES/ML Performed at [facility]    Culture   Final    Multiple bacterial morphotypes present, none predominant. Suggest appropriate recollection if clinically indicated. Performed at [facility]    Report Status 11/01/2014 FINAL  Final     Scheduled Meds: . cefTRIAXone (ROCEPHIN) IVPB 1 gram/50 mL D5W  1 g Intravenous QHS  . clopidogrel  75 mg Oral Daily  . enoxaparin (LOVENOX) injection  40 mg Subcutaneous Q24H  .  fluticasone  2 spray Each Nare Daily  . levothyroxine  75 mcg Oral QAC breakfast  . montelukast  10 mg Oral QHS  . simvastatin  20 mg Oral Daily  . sodium chloride  3 mL Intravenous Q12H   Continuous Infusions:    Rollie Hynek, DO  [facility] Pager [phone]  If 7PM-7AM, please contact night-coverage [email or web address] Password [email or web address] 11/02/2014, 4:51 PM   LOS: 2 days

## 2014-11-02 NOTE — Clinical Social Work Note (Signed)
CSW reviewed pt chart which reflected pt is disoriented x 4  CSW called and left messages with both pt's son, Elta Guadeloupe and daughter, Opal Sidles to provide SNF facilities and obtain choice from pt's adult children  CSW will continue to follow pt until discharge  .Dede Query, LCSW [facility] Clinical Social Worker - Weekend Coverage cell #: [phone]

## 2014-11-03 ENCOUNTER — Inpatient Hospital Stay (HOSPITAL_COMMUNITY): Payer: Medicare Other

## 2014-11-03 DIAGNOSIS — R509 Fever, unspecified: Secondary | ICD-10-CM

## 2014-11-03 LAB — CBC
HCT: 31.9 % — ABNORMAL LOW (ref 36.0–46.0)
Hemoglobin: 10.7 g/dL — ABNORMAL LOW (ref 12.0–15.0)
MCH: 30.9 pg (ref 26.0–34.0)
MCHC: 33.5 g/dL (ref 30.0–36.0)
MCV: 92.2 fL (ref 78.0–100.0)
PLATELETS: 171 10*3/uL (ref 150–400)
RBC: 3.46 MIL/uL — ABNORMAL LOW (ref 3.87–5.11)
RDW: 13 % (ref 11.5–15.5)
WBC: 8.1 10*3/uL (ref 4.0–10.5)

## 2014-11-03 LAB — BASIC METABOLIC PANEL
Anion gap: 7 (ref 5–15)
BUN: 21 mg/dL (ref 6–23)
CHLORIDE: 105 mmol/L (ref 96–112)
CO2: 21 mmol/L (ref 19–32)
Calcium: 8.4 mg/dL (ref 8.4–10.5)
Creatinine, Ser: 1.1 mg/dL (ref 0.50–1.10)
GFR calc Af Amer: 50 mL/min — ABNORMAL LOW (ref >90)
GFR calc non Af Amer: 43 mL/min — ABNORMAL LOW (ref >90)
GLUCOSE: 181 mg/dL — AB (ref 70–99)
Potassium: 3.7 mmol/L (ref 3.5–5.1)
SODIUM: 133 mmol/L — AB (ref 135–145)

## 2014-11-03 LAB — TSH: TSH: 3.048 u[IU]/mL (ref 0.350–4.500)

## 2014-11-03 LAB — CLOSTRIDIUM DIFFICILE BY PCR: Toxigenic C. Difficile by PCR: POSITIVE — AB

## 2014-11-03 MED ORDER — SODIUM CHLORIDE 0.9 % IV SOLN
500.0000 mg | INTRAVENOUS | Status: DC
  Administered 2014-11-03: 500 mg via INTRAVENOUS
  Filled 2014-11-03: qty 500

## 2014-11-03 MED ORDER — DEXTROSE 5 % IV SOLN
1.0000 g | Freq: Two times a day (BID) | INTRAVENOUS | Status: DC
  Administered 2014-11-03: 1 g via INTRAVENOUS
  Filled 2014-11-03 (×2): qty 1

## 2014-11-03 MED ORDER — IBUPROFEN 200 MG PO TABS
400.0000 mg | ORAL_TABLET | ORAL | Status: AC | PRN
  Administered 2014-11-03 – 2014-11-04 (×2): 400 mg via ORAL
  Filled 2014-11-03 (×2): qty 2

## 2014-11-03 MED ORDER — HYDRALAZINE HCL 20 MG/ML IJ SOLN
10.0000 mg | Freq: Once | INTRAMUSCULAR | Status: AC
  Administered 2014-11-03: 10 mg via INTRAVENOUS
  Filled 2014-11-03: qty 1

## 2014-11-03 MED ORDER — METRONIDAZOLE 500 MG PO TABS
500.0000 mg | ORAL_TABLET | Freq: Three times a day (TID) | ORAL | Status: DC
  Administered 2014-11-03 – 2014-11-05 (×7): 500 mg via ORAL
  Filled 2014-11-03 (×7): qty 1

## 2014-11-03 NOTE — Progress Notes (Signed)
PROGRESS NOTE  Sterling YPP:[id number] DOB: [birth year] DOA: 10/31/2014 PCP: Mathews Argyle, MD  Assessment/Plan: Bilateral sacral insufficiency fractures -Discussed with orthopedics, Dr. Domenic Schwab always with walker-->no surgical intervention -PT evaluation -Control pain-->minimal-->d/c morphine -norco prn pain -Check 25 vitamin D--45.3 -start Oscal+D Fever -11/02/14 night--developed fever 102.64F -Obtain chest x-ray -Start vancomycin and cefepime Pending culture data Pyuria/Bacteruria -Concern about UTI--urine culture with multiple organisms -d/c ceftriaxone -3/5-start po cepodoxime-->d/c as pt started on broad spectrum abx 3/6 Gait instability, mechanical falls  -PT evaluation-->SNF Aortic stenosis  -07/22/2014 echocardiogram EF 45-80%, grade 1 diastolic dysfunction, moderate to severe aortic stenosis  -Asymptomatic at this time  hyperlipidemia  -Continue statin  Hypothyroidism  -Continue Synthroid  Normocytic anemia -stable for outpt followup  Night time aggitation -likely sundowning -d/c ambien -haldol prn aggitation  Family Communication: Pt at beside Disposition Plan: SNF 3/7/16a        Procedures/Studies: Dg Lumbar Spine Complete  10/31/2014   CLINICAL DATA:  Fall 2 weeks ago.  Low back and LEFT hip pain.  EXAM: LUMBAR SPINE - COMPLETE 4+ VIEW  COMPARISON:  None.  FINDINGS: L4-L5 posterior lumbar interbody fusion. Radiopaque leads are present over the fusion, likely for prior bones stimulator. No power pack is present. There is adjacent segment disease at L3-L4 with loss of the disc space and endplate sclerosis. Vertebral body height is preserved.  IMPRESSION: No acute osseous abnormality. L4-L5 fusion appears solid with severe L3-L4 adjacent segment degenerative disc disease.   Electronically Signed   By: Dereck Ligas M.D.   On: 10/31/2014 14:36   Ct Head Wo Contrast  11/01/2014   CLINICAL DATA:  Status post fall, with  acute onset of confusion. Initial encounter.  EXAM: CT HEAD WITHOUT CONTRAST  TECHNIQUE: Contiguous axial images were obtained from the base of the skull through the vertex without intravenous contrast.  COMPARISON:  MRI of the brain performed 11/05/2003  FINDINGS: There is no evidence of acute infarction, mass lesion, or intra- or extra-axial hemorrhage on CT.  Prominence of the ventricles and sulci reflects moderate cortical volume loss. Cerebellar atrophy is noted. Scattered periventricular and subcortical white matter change likely reflects small vessel ischemic microangiopathy.  The brainstem and fourth ventricle are within normal limits. The basal ganglia are unremarkable in appearance. The cerebral hemispheres demonstrate grossly normal gray-white differentiation. No mass effect or midline shift is seen.  There is no evidence of fracture; visualized osseous structures are unremarkable in appearance. The visualized portions of the orbits are within normal limits. The paranasal sinuses and mastoid air cells are well-aerated. No significant soft tissue abnormalities are seen.  IMPRESSION: 1. No acute intracranial pathology seen on CT. 2. Moderate cortical volume loss and scattered small vessel ischemic microangiopathy.   Electronically Signed   By: Garald Balding M.D.   On: 11/01/2014 00:32   Mr Lumbar Spine Wo Contrast  10/31/2014   CLINICAL DATA:  Initial evaluation of low back pain and hip pain status post recent fall 2 weeks ago.  EXAM: MRI LUMBAR SPINE WITHOUT CONTRAST  TECHNIQUE: Multiplanar, multisequence MR imaging of the lumbar spine was performed. No intravenous contrast was administered.  COMPARISON:  Concomitant MRI the pelvis as well as previous radiographs from 06/25/2013.  FINDINGS: For the purposes of this dictation, the lowest well-formed intervertebral disc space is presumed to be the L5-S1 level, and there presumed to be 5 lumbar type vertebral bodies.  Patient is status post spinal fusion  at  L4-5 with extensive susceptibility artifact present within the lumbar spine in this region.  Vertebral body heights are preserved. No lumbar fracture. There is suspected trace anterolisthesis of L3 on L4. Vertebral bodies are otherwise normally aligned.  Abnormal T1 hypo intense, T2/STIR hyperintense signal intensity seen within the bilateral sacral ala, compatible with insufficiency fractures. These are nondisplaced. Otherwise, signal intensity within the vertebral body bone marrow is normal.  Conus medullaris terminates normally at the L1 level. Signal intensity within the visualized cord is unremarkable. Nerve roots of the cauda equina grossly within normal limits.  Paraspinous soft tissues within normal limits. Visualized visceral structures are unremarkable. No retroperitoneal adenopathy. There is mild aneurysmal dilatation of the intra-abdominal aorta up to 3.3 cm.  Mild fatty atrophy noted within the paraspinous musculature.  At T10-11 through T12-L1, there is no disc bulge or disc protrusion. No significant canal or foraminal stenosis.  L1-2: Mild disc bulge without focal disc protrusion. There is bilateral facet hypertrophy with ligamentum flavum thickening. There is mild canal stenosis without significant foraminal narrowing.  L2-3: Mild diffuse disc bulge without focal disc protrusion. Mild bilateral facet arthrosis with ligamentum flavum thickening. There is resultant probable moderate canal stenosis, although evaluation somewhat limited due to susceptibility artifact from adjacent hardware. Moderate bilateral foraminal narrowing present as well, worse on the left.  L3-4: Status post spinal fusion. There is probable mild disc bulge with bilateral facet arthrosis. There is resultant moderate canal stenosis. Foramina not well evaluated at this level, however, at least moderate bilateral foraminal narrowing is suspected.  L4-5: Status post spinal fusion. This levels poorly evaluated due to susceptibility  artifact.  L5-S1: No significant disc bulge or disc protrusion. No canal stenosis. No definite foraminal narrowing.  IMPRESSION: 1. Bilateral sacral insufficiency fractures without displacement. Finding is better evaluated on concomitant MRI of the pelvis. 2. No other acute abnormality within the lumbar spine. 3. Status post spinal fusion at L4 and L5 without complication. 4. Degenerative disc bulge and facet hypertrophy at L2-3 with resultant moderate canal and bilateral foraminal stenosis, slightly worse on the left. 5. Mild degenerative disc bulge and facet arthrosis at L3-4 with resultant moderate canal stenosis. 6. Mild aneurysmal dilatation of the infrarenal aorta up to 3.3 cm.   Electronically Signed   By: Jeannine Boga M.D.   On: 10/31/2014 22:31   Mr Hip Left Wo Contrast  10/31/2014   CLINICAL DATA:  Status post fall.  Low back pain.  EXAM: MR OF THE LEFT HIP WITHOUT CONTRAST  TECHNIQUE: Multiplanar, multisequence MR imaging was performed. No intravenous contrast was administered.  COMPARISON:  None.  FINDINGS: Bones: No hip fracture, dislocation or avascular necrosis. Marrow edema sacrum bilaterally consistent with sacral insufficiency fractures.  There is no other marrow signal abnormality.  Articular cartilage and labrum  Articular cartilage: Partial-thickness left superior hip cartilage loss. Mild right hip partial thickness cartilage loss.  Labrum:  No gross labral or paralabral abnormality.  Joint or bursal effusion  Joint effusion:  No joint effusion.  Bursae:  No bursa formation.  Muscles and tendons  Flexors: Normal.  Extensors: Normal.  Abductors: Normal.  Adductors: Normal.  Rotators: Normal.  Hamstrings: Very small partial tear is at the hamstring origin bilaterally.  Piriformis: Mild edema of the right piriformis muscle and adjacent to the right piriformis muscle most consistent with muscle strain.  Other findings  Miscellaneous: Diverticulosis without evidence of diverticulitis. No  pelvic free fluid. No inguinal lymphadenopathy. No inguinal hernia.  IMPRESSION: 1. Acute, bilateral  nondisplaced sacral insufficiency fractures. 2. No hip fracture, dislocation or avascular necrosis.   Electronically Signed   By: Kathreen Devoid   On: 10/31/2014 21:55   Dg Hip Unilat With Pelvis 2-3 Views Left  10/31/2014   CLINICAL DATA:  Fall 2 weeks ago.  Low back and LEFT hip pain.  EXAM: LEFT HIP (WITH PELVIS) 2-3 VIEWS  COMPARISON:  None.  FINDINGS: Pelvic rings appear intact. Hip joint spaces are preserved. Atherosclerosis. On the lateral view, LEFT hip shows tiny marginal osteophytes compatible with mild osteoarthritis.  IMPRESSION: Mild LEFT hip osteoarthritis.  No acute osseous abnormality.   Electronically Signed   By: Dereck Ligas M.D.   On: 10/31/2014 14:35         Subjective: Patient is pleasantly confused but able to answer appropriately. Denies any chest pain, stress breath, vomiting, diarrhea, abdominal pain, dysuria.  Objective: Filed Vitals:   11/02/14 1410 11/02/14 2131 11/03/14 0130 11/03/14 0555  BP: 157/68 156/58  171/67  Pulse: 66 82  68  Temp: 98.5 F (36.9 C) 100.1 F (37.8 C) 102.4 F (39.1 C) 98.7 F (37.1 C)  TempSrc: Oral Oral Oral Oral  Resp: 18 20  20   Height:      Weight:      SpO2: 98% 90%  99%    Intake/Output Summary (Last 24 hours) at 11/03/14 0910 Last data filed at 11/02/14 2139  Gross per 24 hour  Intake    410 ml  Output      0 ml  Net    410 ml   Weight change:  Exam:   General:  Pt is alert, follows commands appropriately, not in acute distress  HEENT: No icterus, No thrush,  [place]/AT; no meningismus  Cardiovascular: RRR, S1/S2, no rubs, no gallops  Respiratory: CTA bilaterally, no wheezing, no crackles, no rhonchi  Abdomen: Soft/+BS, non tender, non distended, no guarding  Extremities: No edema, No lymphangitis, No petechiae, No rashes, no synovitis  Data Reviewed: Basic Metabolic Panel:  Recent Labs Lab 10/31/14 1809  11/01/14 0400 11/03/14 0242  NA 138 137 133*  K 4.3 4.2 3.7  CL 106 105 105  CO2 26 24 21   GLUCOSE 86 92 181*  BUN 18 16 21   CREATININE 0.93 0.93 1.10  CALCIUM 8.7 8.6 8.4   Liver Function Tests:  Recent Labs Lab 10/31/14 1809 11/01/14 0400  AST 21 19  ALT 13 12  ALKPHOS 60 58  BILITOT 0.7 0.8  PROT 6.0 5.9*  ALBUMIN 3.1* 3.0*   No results for input(s): LIPASE, AMYLASE in the last 168 hours. No results for input(s): AMMONIA in the last 168 hours. CBC:  Recent Labs Lab 10/31/14 1809 11/01/14 0400 11/03/14 0242  WBC 5.9 5.6 8.1  NEUTROABS 3.7 3.8  --   HGB 10.3* 10.2* 10.7*  HCT 31.7* 30.6* 31.9*  MCV 95.5 95.6 92.2  PLT 156 167 171   Cardiac Enzymes:  Recent Labs Lab 11/01/14 0400  TROPONINI 0.03   BNP: Invalid input(s): POCBNP CBG: No results for input(s): GLUCAP in the last 168 hours.  Recent Results (from the past 240 hour(s))  Urine culture     Status: None   Collection Time: 10/31/14  7:10 PM  Result Value Ref Range Status   Specimen Description URINE, RANDOM  Final   Special Requests NONE  Final   Colony Count   Final    >=100,000 COLONIES/ML Performed at [facility]   Final  Multiple bacterial morphotypes present, none predominant. Suggest appropriate recollection if clinically indicated. Performed at [facility]    Report Status 11/01/2014 FINAL  Final     Scheduled Meds: . calcium-vitamin D  1 tablet Oral BID WC  . cefpodoxime  200 mg Oral Q12H  . clopidogrel  75 mg Oral Daily  . enoxaparin (LOVENOX) injection  40 mg Subcutaneous Q24H  . fluticasone  2 spray Each Nare Daily  . levothyroxine  75 mcg Oral QAC breakfast  . montelukast  10 mg Oral QHS  . simvastatin  20 mg Oral Daily  . sodium chloride  3 mL Intravenous Q12H   Continuous Infusions:    Vic Esco, DO  [facility] Pager [phone]  If 7PM-7AM, please contact night-coverage [email or web address] Password [email or web address] 11/03/2014, 9:10 AM    LOS: 3 days

## 2014-11-03 NOTE — Progress Notes (Signed)
CRITICAL VALUE ALERT  Critical value received:  C-Diff Positive   Date of notification:  11/03/14  Time of notification:  5747  Critical value read back:Yes.    Nurse who received alert:  Rulon Abide, RN  MD notified (1st page):  Tat   Time of first page:  1625  MD notified (2nd page):  Time of second page:  Responding MD:  Tat   Time MD responded:  [phone]

## 2014-11-03 NOTE — Clinical Social Work Note (Signed)
CSW spoke with pt's daughter Opal Sidles to provide SNF bed options  Pt's family has chosen [facility] as SNF choice.  CSW sent recent MD progress note and also let facility know that pt has chosen their facility for their rehab needs  Weekday CSW will follow up with facility when pt is ready for discharge.  Dede Query, LCSW [place] Worker - Weekend Coverage cell #: [phone]

## 2014-11-03 NOTE — Progress Notes (Addendum)
Notified by RN pt has positive Cdiff PCR  --this is likely the source of her fever --d/c abx that were started this am when clinical situation was unclear --will on restart IV abx only if cultures are positive, persistent fever, or other objective evidence of infection or sepsis. --11/03/14 CXR is negative  DTat

## 2014-11-03 NOTE — Progress Notes (Signed)
ANTIBIOTIC CONSULT NOTE - INITIAL  Pharmacy Consult for Vancomycin Indication: [facility] coverage for new fever  Allergies  Allergen Reactions  . Fosamax [Alendronate Sodium]   . Hctz [Hydrochlorothiazide]   . Lanolin   . Latex   . Neosporin [Neomycin-Bacitracin Zn-Polymyx]   . Norvasc [Amlodipine Besylate]   . Polysporin [Bacitracin-Polymyxin B]     rash    Patient Measurements: Height: 5\' 4"  (162.6 cm) Weight: 130 lb 15.3 oz (59.4 kg) IBW/kg (Calculated) : 54.7  Vital Signs: Temp: 98.7 F (37.1 C) (03/06 0555) Temp Source: Oral (03/06 0555) BP: 171/67 mmHg (03/06 0555) Pulse Rate: 68 (03/06 0555) Intake/Output from previous day: 03/05 0701 - 03/06 0700 In: 410 [P.O.:410] Out: -  Intake/Output from this shift:    Labs:  Recent Labs  10/31/14 1809 11/01/14 0400 11/03/14 0242  WBC 5.9 5.6 8.1  HGB 10.3* 10.2* 10.7*  PLT 156 167 171  CREATININE 0.93 0.93 1.10   Estimated Creatinine Clearance: 29.4 mL/min (by C-G formula based on Cr of 1.1). No results for input(s): VANCOTROUGH, VANCOPEAK, VANCORANDOM, GENTTROUGH, GENTPEAK, GENTRANDOM, TOBRATROUGH, TOBRAPEAK, TOBRARND, AMIKACINPEAK, AMIKACINTROU, AMIKACIN in the last 72 hours.    Medical History: Past Medical History  Diagnosis Date  . Hypertension   . Thyroid disease   . Osteoporosis   . Depression   . Hyperlipidemia     Assessment: 78 yoF admitted on 3/3 with back/hip pain s/p fall two weeks ago at home.  She was noted to have UTI and initially started on ceftriaxone, then changed to [facility].  Urine culture grew >100k multiple morphotypes.  Today, she is noted to have new fever and Cefepime is started per MD dosing.  Pharmacy is consulted to dose Vancomycin.  3/4 >> ceftriaxone >> 3/5 3/5 >> cefpodoxime >> 3/6 3/6 >> Cefepime >> 3/6 >> Vancomycin >>  Today, 11/03/2014:  Tmax: 102.4  WBCs: increased but remains WNL, 8.1  Renal: SCr increased to 1.1, CrCl ~ 29 ml/min  Chest xray and blood cultures  pending   Goal of Therapy:  Vancomycin trough level 15-20 mcg/ml  Plan:   Cefepime 1g IV q12h per MD  Follow up renal function.  If CrCl remains < 30 ml/min, recommend decreasing to Cefepime 1g IV q24h.  Vancomycin 500mg  IV q24h.  Measure Vanc trough at steady state.  Follow up renal fxn, culture results, and clinical course.   Gretta Arab PharmD, BCPS Pager [phone] 11/03/2014 9:47 AM

## 2014-11-04 DIAGNOSIS — A047 Enterocolitis due to Clostridium difficile: Secondary | ICD-10-CM

## 2014-11-04 DIAGNOSIS — A0472 Enterocolitis due to Clostridium difficile, not specified as recurrent: Secondary | ICD-10-CM

## 2014-11-04 LAB — LACTIC ACID, PLASMA: Lactic Acid, Venous: 0.8 mmol/L (ref 0.5–2.0)

## 2014-11-04 LAB — COMPREHENSIVE METABOLIC PANEL
ALT: 19 U/L (ref 0–35)
ANION GAP: 6 (ref 5–15)
AST: 23 U/L (ref 0–37)
Albumin: 2.8 g/dL — ABNORMAL LOW (ref 3.5–5.2)
Alkaline Phosphatase: 61 U/L (ref 39–117)
BUN: 20 mg/dL (ref 6–23)
CO2: 25 mmol/L (ref 19–32)
Calcium: 8.6 mg/dL (ref 8.4–10.5)
Chloride: 106 mmol/L (ref 96–112)
Creatinine, Ser: 0.91 mg/dL (ref 0.50–1.10)
GFR calc Af Amer: 62 mL/min — ABNORMAL LOW (ref >90)
GFR calc non Af Amer: 54 mL/min — ABNORMAL LOW (ref >90)
Glucose, Bld: 109 mg/dL — ABNORMAL HIGH (ref 70–99)
POTASSIUM: 3.8 mmol/L (ref 3.5–5.1)
SODIUM: 137 mmol/L (ref 135–145)
Total Bilirubin: 0.5 mg/dL (ref 0.3–1.2)
Total Protein: 6.1 g/dL (ref 6.0–8.3)

## 2014-11-04 LAB — CBC
HCT: 31.2 % — ABNORMAL LOW (ref 36.0–46.0)
Hemoglobin: 10.6 g/dL — ABNORMAL LOW (ref 12.0–15.0)
MCH: 31.5 pg (ref 26.0–34.0)
MCHC: 34 g/dL (ref 30.0–36.0)
MCV: 92.6 fL (ref 78.0–100.0)
PLATELETS: 185 10*3/uL (ref 150–400)
RBC: 3.37 MIL/uL — ABNORMAL LOW (ref 3.87–5.11)
RDW: 13.1 % (ref 11.5–15.5)
WBC: 7.4 10*3/uL (ref 4.0–10.5)

## 2014-11-04 NOTE — Progress Notes (Signed)
PROGRESS NOTE  [place] NTI:[id number] DOB: 12/17/1921 DOA: 10/31/2014 PCP: Mathews Argyle, MD  Assessment/Plan: Bilateral sacral insufficiency fractures -Discussed with orthopedics, Dr. Domenic Schwab always with walker-->no surgical intervention -PT evaluation  -Control pain-->minimal-->d/c morphine -norco prn pain -Check 25 vitamin D--45.3 -start Oscal+D Cdiff colitis --11/03/14--started on flagyl 11/03/14 Fever -11/02/14 night--developed fever 102.32F -Obtain chest x-ray--no consolidation -likely due to Cdiff colitis -d/c IV vanco and cefepime -11/03/14--started on flagyl 11/03/14 Pyuria/Bacteruria -Concern about UTI--urine culture with multiple organisms -d/c ceftriaxone -d/c cefepime -pt had 3 days of abx Gait instability, mechanical falls  -PT evaluation-->SNF Aortic stenosis  -07/22/2014 echocardiogram EF 86-76%, grade 1 diastolic dysfunction, moderate to severe aortic stenosis  -Asymptomatic at this time  hyperlipidemia  -Continue statin  Hypothyroidism  -Continue Synthroid  Normocytic anemia -stable for outpt followup Night time aggitation -likely sundowning -d/c ambien -haldol prn aggitation  Family Communication: daughter updated at bedside 11/05/14 Disposition Plan: SNF 11/05/14 if stable        Procedures/Studies: Dg Lumbar Spine Complete  10/31/2014   CLINICAL DATA:  Fall 2 weeks ago.  Low back and LEFT hip pain.  EXAM: LUMBAR SPINE - COMPLETE 4+ VIEW  COMPARISON:  None.  FINDINGS: L4-L5 posterior lumbar interbody fusion. Radiopaque leads are present over the fusion, likely for prior bones stimulator. No power pack is present. There is adjacent segment disease at L3-L4 with loss of the disc space and endplate sclerosis. Vertebral body height is preserved.  IMPRESSION: No acute osseous abnormality. L4-L5 fusion appears solid with severe L3-L4 adjacent segment degenerative disc disease.   Electronically Signed   By: Dereck Ligas  M.D.   On: 10/31/2014 14:36   Ct Head Wo Contrast  11/01/2014   CLINICAL DATA:  Status post fall, with acute onset of confusion. Initial encounter.  EXAM: CT HEAD WITHOUT CONTRAST  TECHNIQUE: Contiguous axial images were obtained from the base of the skull through the vertex without intravenous contrast.  COMPARISON:  MRI of the brain performed 11/05/2003  FINDINGS: There is no evidence of acute infarction, mass lesion, or intra- or extra-axial hemorrhage on CT.  Prominence of the ventricles and sulci reflects moderate cortical volume loss. Cerebellar atrophy is noted. Scattered periventricular and subcortical white matter change likely reflects small vessel ischemic microangiopathy.  The brainstem and fourth ventricle are within normal limits. The basal ganglia are unremarkable in appearance. The cerebral hemispheres demonstrate grossly normal gray-white differentiation. No mass effect or midline shift is seen.  There is no evidence of fracture; visualized osseous structures are unremarkable in appearance. The visualized portions of the orbits are within normal limits. The paranasal sinuses and mastoid air cells are well-aerated. No significant soft tissue abnormalities are seen.  IMPRESSION: 1. No acute intracranial pathology seen on CT. 2. Moderate cortical volume loss and scattered small vessel ischemic microangiopathy.   Electronically Signed   By: Garald Balding M.D.   On: 11/01/2014 00:32   Mr Lumbar Spine Wo Contrast  10/31/2014   CLINICAL DATA:  Initial evaluation of low back pain and hip pain status post recent fall 2 weeks ago.  EXAM: MRI LUMBAR SPINE WITHOUT CONTRAST  TECHNIQUE: Multiplanar, multisequence MR imaging of the lumbar spine was performed. No intravenous contrast was administered.  COMPARISON:  Concomitant MRI the pelvis as well as previous radiographs from 06/25/2013.  FINDINGS: For the purposes of this dictation, the lowest well-formed intervertebral disc space is presumed to be the  L5-S1 level, and there presumed  to be 5 lumbar type vertebral bodies.  Patient is status post spinal fusion at L4-5 with extensive susceptibility artifact present within the lumbar spine in this region.  Vertebral body heights are preserved. No lumbar fracture. There is suspected trace anterolisthesis of L3 on L4. Vertebral bodies are otherwise normally aligned.  Abnormal T1 hypo intense, T2/STIR hyperintense signal intensity seen within the bilateral sacral ala, compatible with insufficiency fractures. These are nondisplaced. Otherwise, signal intensity within the vertebral body bone marrow is normal.  Conus medullaris terminates normally at the L1 level. Signal intensity within the visualized cord is unremarkable. Nerve roots of the cauda equina grossly within normal limits.  Paraspinous soft tissues within normal limits. Visualized visceral structures are unremarkable. No retroperitoneal adenopathy. There is mild aneurysmal dilatation of the intra-abdominal aorta up to 3.3 cm.  Mild fatty atrophy noted within the paraspinous musculature.  At T10-11 through T12-L1, there is no disc bulge or disc protrusion. No significant canal or foraminal stenosis.  L1-2: Mild disc bulge without focal disc protrusion. There is bilateral facet hypertrophy with ligamentum flavum thickening. There is mild canal stenosis without significant foraminal narrowing.  L2-3: Mild diffuse disc bulge without focal disc protrusion. Mild bilateral facet arthrosis with ligamentum flavum thickening. There is resultant probable moderate canal stenosis, although evaluation somewhat limited due to susceptibility artifact from adjacent hardware. Moderate bilateral foraminal narrowing present as well, worse on the left.  L3-4: Status post spinal fusion. There is probable mild disc bulge with bilateral facet arthrosis. There is resultant moderate canal stenosis. Foramina not well evaluated at this level, however, at least moderate bilateral foraminal  narrowing is suspected.  L4-5: Status post spinal fusion. This levels poorly evaluated due to susceptibility artifact.  L5-S1: No significant disc bulge or disc protrusion. No canal stenosis. No definite foraminal narrowing.  IMPRESSION: 1. Bilateral sacral insufficiency fractures without displacement. Finding is better evaluated on concomitant MRI of the pelvis. 2. No other acute abnormality within the lumbar spine. 3. Status post spinal fusion at L4 and L5 without complication. 4. Degenerative disc bulge and facet hypertrophy at L2-3 with resultant moderate canal and bilateral foraminal stenosis, slightly worse on the left. 5. Mild degenerative disc bulge and facet arthrosis at L3-4 with resultant moderate canal stenosis. 6. Mild aneurysmal dilatation of the infrarenal aorta up to 3.3 cm.   Electronically Signed   By: Jeannine Boga M.D.   On: 10/31/2014 22:31   Mr Hip Left Wo Contrast  10/31/2014   CLINICAL DATA:  Status post fall.  Low back pain.  EXAM: MR OF THE LEFT HIP WITHOUT CONTRAST  TECHNIQUE: Multiplanar, multisequence MR imaging was performed. No intravenous contrast was administered.  COMPARISON:  None.  FINDINGS: Bones: No hip fracture, dislocation or avascular necrosis. Marrow edema sacrum bilaterally consistent with sacral insufficiency fractures.  There is no other marrow signal abnormality.  Articular cartilage and labrum  Articular cartilage: Partial-thickness left superior hip cartilage loss. Mild right hip partial thickness cartilage loss.  Labrum:  No gross labral or paralabral abnormality.  Joint or bursal effusion  Joint effusion:  No joint effusion.  Bursae:  No bursa formation.  Muscles and tendons  Flexors: Normal.  Extensors: Normal.  Abductors: Normal.  Adductors: Normal.  Rotators: Normal.  Hamstrings: Very small partial tear is at the hamstring origin bilaterally.  Piriformis: Mild edema of the right piriformis muscle and adjacent to the right piriformis muscle most consistent  with muscle strain.  Other findings  Miscellaneous: Diverticulosis without evidence of diverticulitis.  No pelvic free fluid. No inguinal lymphadenopathy. No inguinal hernia.  IMPRESSION: 1. Acute, bilateral nondisplaced sacral insufficiency fractures. 2. No hip fracture, dislocation or avascular necrosis.   Electronically Signed   By: Kathreen Devoid   On: 10/31/2014 21:55   Dg Chest Port 1 View  11/03/2014   CLINICAL DATA:  Fever.  EXAM: PORTABLE CHEST - 1 VIEW  COMPARISON:  None.  FINDINGS: The heart size and mediastinal contours are within normal limits. Both lungs are clear. No evidence of pleural effusion. Mild tortuosity and atherosclerotic calcification of thoracic aorta noted.  IMPRESSION: No active disease.   Electronically Signed   By: Earle Gell M.D.   On: 11/03/2014 10:35   Dg Hip Unilat With Pelvis 2-3 Views Left  10/31/2014   CLINICAL DATA:  Fall 2 weeks ago.  Low back and LEFT hip pain.  EXAM: LEFT HIP (WITH PELVIS) 2-3 VIEWS  COMPARISON:  None.  FINDINGS: Pelvic rings appear intact. Hip joint spaces are preserved. Atherosclerosis. On the lateral view, LEFT hip shows tiny marginal osteophytes compatible with mild osteoarthritis.  IMPRESSION: Mild LEFT hip osteoarthritis.  No acute osseous abnormality.   Electronically Signed   By: Dereck Ligas M.D.   On: 10/31/2014 14:35         Subjective: Patient denies fevers, chills, headache, chest pain, dyspnea, nausea, vomiting, diarrhea, abdominal pain, dysuria, hematuria   Objective: Filed Vitals:   11/03/14 2217 11/03/14 2319 11/04/14 0340 11/04/14 0636  BP:    170/76  Pulse:    67  Temp: 102.3 F (39.1 C) 102.1 F (38.9 C) 98.3 F (36.8 C) 97.6 F (36.4 C)  TempSrc: Rectal Rectal Rectal Oral  Resp:    18  Height:      Weight:      SpO2:    98%    Intake/Output Summary (Last 24 hours) at 11/04/14 1214 Last data filed at 11/04/14 0942  Gross per 24 hour  Intake    720 ml  Output      0 ml  Net    720 ml   Weight  change:  Exam:   General:  Pt is alert, follows commands appropriately, not in acute distress  HEENT: No icterus, No thrush, [place]/AT  Cardiovascular: RRR, S1/S2, no rubs, no gallops  Respiratory: Bibasilar crackles, left greater than right. No wheezing.  Abdomen: Soft/+BS, mild periumbilical tenderness without any rebound, non distended, no guarding  Extremities: No edema, No lymphangitis, No petechiae, No rashes, no synovitis  Data Reviewed: Basic Metabolic Panel:  Recent Labs Lab 10/31/14 1809 11/01/14 0400 11/03/14 0242 11/04/14 0940  NA 138 137 133* 137  K 4.3 4.2 3.7 3.8  CL 106 105 105 106  CO2 26 24 21 25   GLUCOSE 86 92 181* 109*  BUN 18 16 21 20   CREATININE 0.93 0.93 1.10 0.91  CALCIUM 8.7 8.6 8.4 8.6   Liver Function Tests:  Recent Labs Lab 10/31/14 1809 11/01/14 0400 11/04/14 0940  AST 21 19 23   ALT 13 12 19   ALKPHOS 60 58 61  BILITOT 0.7 0.8 0.5  PROT 6.0 5.9* 6.1  ALBUMIN 3.1* 3.0* 2.8*   No results for input(s): LIPASE, AMYLASE in the last 168 hours. No results for input(s): AMMONIA in the last 168 hours. CBC:  Recent Labs Lab 10/31/14 1809 11/01/14 0400 11/03/14 0242 11/04/14 0940  WBC 5.9 5.6 8.1 7.4  NEUTROABS 3.7 3.8  --   --   HGB 10.3* 10.2* 10.7* 10.6*  HCT 31.7* 30.6*  31.9* 31.2*  MCV 95.5 95.6 92.2 92.6  PLT 156 167 171 185   Cardiac Enzymes:  Recent Labs Lab 11/01/14 0400  TROPONINI 0.03   BNP: Invalid input(s): POCBNP CBG: No results for input(s): GLUCAP in the last 168 hours.  Recent Results (from the past 240 hour(s))  Urine culture     Status: None   Collection Time: 10/31/14  7:10 PM  Result Value Ref Range Status   Specimen Description URINE, RANDOM  Final   Special Requests NONE  Final   Colony Count   Final    >=100,000 COLONIES/ML Performed at [facility]    Culture   Final    Multiple bacterial morphotypes present, none predominant. Suggest appropriate recollection if clinically  indicated. Performed at [facility]    Report Status 11/01/2014 FINAL  Final  Culture, blood (routine x 2)     Status: None (Preliminary result)   Collection Time: 11/03/14  2:30 AM  Result Value Ref Range Status   Specimen Description BLOOD RIGHT ARM  Final   Special Requests BOTTLES DRAWN AEROBIC AND ANAEROBIC 10CC  Final   Culture   Final           BLOOD CULTURE RECEIVED NO GROWTH TO DATE CULTURE WILL BE HELD FOR 5 DAYS BEFORE ISSUING A FINAL NEGATIVE REPORT Performed at [facility]    Report Status PENDING  Incomplete  Culture, blood (routine x 2)     Status: None (Preliminary result)   Collection Time: 11/03/14  2:40 AM  Result Value Ref Range Status   Specimen Description BLOOD LEFT ARM  Final   Special Requests BOTTLES DRAWN AEROBIC AND ANAEROBIC 10CC  Final   Culture   Final           BLOOD CULTURE RECEIVED NO GROWTH TO DATE CULTURE WILL BE HELD FOR 5 DAYS BEFORE ISSUING A FINAL NEGATIVE REPORT Performed at [facility]    Report Status PENDING  Incomplete  Clostridium Difficile by PCR     Status: Abnormal   Collection Time: 11/03/14  3:28 PM  Result Value Ref Range Status   C difficile by pcr POSITIVE (A) NEGATIVE Final    Comment: CRITICAL RESULT CALLED TO, READ BACK BY AND VERIFIED WITH: Campbell Stall [id number] @ 1623 BY J SCOTTON      Scheduled Meds: . calcium-vitamin D  1 tablet Oral BID WC  . clopidogrel  75 mg Oral Daily  . enoxaparin (LOVENOX) injection  40 mg Subcutaneous Q24H  . fluticasone  2 spray Each Nare Daily  . levothyroxine  75 mcg Oral QAC breakfast  . metroNIDAZOLE  500 mg Oral 3 times per day  . montelukast  10 mg Oral QHS  . simvastatin  20 mg Oral Daily  . sodium chloride  3 mL Intravenous Q12H   Continuous Infusions:    Jenilee Franey, DO  [facility] Pager [phone]  If 7PM-7AM, please contact night-coverage [email or web address] Password [email or web address] 11/04/2014, 12:14 PM   LOS: 4 days

## 2014-11-04 NOTE — Progress Notes (Signed)
Physical Therapy Treatment Patient Details Name: Jacqueline Robles MRN: [id number] DOB: September 07, 1921 Today's Date: 11/04/2014    History of Present Illness 81 yo female admitted with bil sacral fxs. Hx of HTN, osteoporosis, macular degeneration.     PT Comments    Daughter in room stating she didn't think "mother" could do much so agreed to perform bed TE's.  AAROM TE's B LE's followed by repositioning in bed ti comfort.    Follow Up Recommendations  Supervision/Assistance - 24 hour;SNF (family has chosen [facility])     Equipment Recommendations       Recommendations for Other Services       Precautions / Restrictions      Mobility  Bed Mobility  repositioned total assist + 1 scooting to [facility]                Transfers  dghtr/pt declined any OOB activity a this time                  Ambulation/Gait                 Stairs            Wheelchair Mobility    Modified Rankin (Stroke Patients Only)       Balance                                    Cognition Arousal/Alertness: Awake/alert Behavior During Therapy: WFL for tasks assessed/performed Overall Cognitive Status: Within Functional Limits for tasks assessed                      Exercises  B LE AP 20 resp B LE HS, ABD, ADD, SAQ's 10 resp AAROM    General Comments        Pertinent Vitals/Pain Pain Assessment: Faces Faces Pain Scale: Hurts even more Pain Location: with activity Pain Descriptors / Indicators: Sharp Pain Intervention(s): Monitored during session;Repositioned    Home Living                      Prior Function            PT Goals (current goals can now be found in the care plan section) Progress towards PT goals: Progressing toward goals    Frequency  Min 3X/week    PT Plan      Co-evaluation             End of Session   Activity Tolerance: Patient limited by pain Patient left: in bed;with call bell/phone within  reach     Time: 1428-1443 PT Time Calculation (min) (ACUTE ONLY): 15 min  Charges:  $Therapeutic Exercise: 8-22 mins                    G Codes:      Rica Koyanagi  PTA [facility]  Acute  Rehab Pager      [phone]

## 2014-11-04 NOTE — Clinical Social Work Note (Signed)
Updated [facility] that patient has selected their bed and is not ready today- CSW advised SNF we were hopeful for tomorrow and will update them tomorrow morning.   Eduard Clos, MSW, [place]

## 2014-11-05 DIAGNOSIS — F419 Anxiety disorder, unspecified: Secondary | ICD-10-CM | POA: Diagnosis not present

## 2014-11-05 DIAGNOSIS — E785 Hyperlipidemia, unspecified: Secondary | ICD-10-CM | POA: Diagnosis not present

## 2014-11-05 DIAGNOSIS — R451 Restlessness and agitation: Secondary | ICD-10-CM | POA: Diagnosis not present

## 2014-11-05 DIAGNOSIS — A047 Enterocolitis due to Clostridium difficile: Secondary | ICD-10-CM | POA: Diagnosis not present

## 2014-11-05 DIAGNOSIS — M6281 Muscle weakness (generalized): Secondary | ICD-10-CM | POA: Diagnosis not present

## 2014-11-05 DIAGNOSIS — H6121 Impacted cerumen, right ear: Secondary | ICD-10-CM | POA: Diagnosis not present

## 2014-11-05 DIAGNOSIS — S3210XK Unspecified fracture of sacrum, subsequent encounter for fracture with nonunion: Secondary | ICD-10-CM | POA: Diagnosis not present

## 2014-11-05 DIAGNOSIS — M4848XD Fatigue fracture of vertebra, sacral and sacrococcygeal region, subsequent encounter for fracture with routine healing: Secondary | ICD-10-CM | POA: Diagnosis not present

## 2014-11-05 DIAGNOSIS — Z7189 Other specified counseling: Secondary | ICD-10-CM | POA: Diagnosis not present

## 2014-11-05 DIAGNOSIS — N390 Urinary tract infection, site not specified: Secondary | ICD-10-CM | POA: Diagnosis not present

## 2014-11-05 DIAGNOSIS — M8448XS Pathological fracture, other site, sequela: Secondary | ICD-10-CM | POA: Diagnosis not present

## 2014-11-05 DIAGNOSIS — L22 Diaper dermatitis: Secondary | ICD-10-CM | POA: Diagnosis not present

## 2014-11-05 DIAGNOSIS — B999 Unspecified infectious disease: Secondary | ICD-10-CM | POA: Diagnosis not present

## 2014-11-05 DIAGNOSIS — Z9181 History of falling: Secondary | ICD-10-CM | POA: Diagnosis not present

## 2014-11-05 DIAGNOSIS — S329XXA Fracture of unspecified parts of lumbosacral spine and pelvis, initial encounter for closed fracture: Secondary | ICD-10-CM | POA: Diagnosis not present

## 2014-11-05 DIAGNOSIS — E039 Hypothyroidism, unspecified: Secondary | ICD-10-CM | POA: Diagnosis not present

## 2014-11-05 DIAGNOSIS — B372 Candidiasis of skin and nail: Secondary | ICD-10-CM | POA: Diagnosis not present

## 2014-11-05 DIAGNOSIS — R488 Other symbolic dysfunctions: Secondary | ICD-10-CM | POA: Diagnosis not present

## 2014-11-05 DIAGNOSIS — J309 Allergic rhinitis, unspecified: Secondary | ICD-10-CM | POA: Diagnosis not present

## 2014-11-05 DIAGNOSIS — E038 Other specified hypothyroidism: Secondary | ICD-10-CM | POA: Diagnosis not present

## 2014-11-05 DIAGNOSIS — D649 Anemia, unspecified: Secondary | ICD-10-CM | POA: Diagnosis not present

## 2014-11-05 DIAGNOSIS — I350 Nonrheumatic aortic (valve) stenosis: Secondary | ICD-10-CM | POA: Diagnosis not present

## 2014-11-05 DIAGNOSIS — R269 Unspecified abnormalities of gait and mobility: Secondary | ICD-10-CM | POA: Diagnosis not present

## 2014-11-05 DIAGNOSIS — R509 Fever, unspecified: Secondary | ICD-10-CM | POA: Diagnosis not present

## 2014-11-05 DIAGNOSIS — E034 Atrophy of thyroid (acquired): Secondary | ICD-10-CM | POA: Diagnosis not present

## 2014-11-05 LAB — BASIC METABOLIC PANEL
Anion gap: 5 (ref 5–15)
BUN: 18 mg/dL (ref 6–23)
CALCIUM: 8.4 mg/dL (ref 8.4–10.5)
CO2: 24 mmol/L (ref 19–32)
CREATININE: 0.94 mg/dL (ref 0.50–1.10)
Chloride: 106 mmol/L (ref 96–112)
GFR calc Af Amer: 60 mL/min — ABNORMAL LOW (ref >90)
GFR calc non Af Amer: 52 mL/min — ABNORMAL LOW (ref >90)
GLUCOSE: 95 mg/dL (ref 70–99)
Potassium: 4.2 mmol/L (ref 3.5–5.1)
Sodium: 135 mmol/L (ref 135–145)

## 2014-11-05 MED ORDER — LORAZEPAM 0.5 MG PO TABS: 0.5000 mg | ORAL_TABLET | Freq: Three times a day (TID) | ORAL | Status: DC | PRN

## 2014-11-05 MED ORDER — HYDROCODONE-ACETAMINOPHEN 5-325 MG PO TABS: 1.0000 | ORAL_TABLET | Freq: Four times a day (QID) | ORAL | Status: DC | PRN

## 2014-11-05 MED ORDER — METRONIDAZOLE 500 MG PO TABS: 500.0000 mg | ORAL_TABLET | Freq: Three times a day (TID) | ORAL | Status: DC

## 2014-11-05 NOTE — Clinical Social Work Note (Addendum)
Patient for d/c today to SNF bed at  [facility] as [facility] their preference did not have a private room for her with cdiff.  Daughter, son and patient agreeable to this plan- patient is feeling upset because she is having a bloody nose and the liquids are "going right through me".Daughter a bit emotional because her father was also at the building [name] at (18years ago). CSW support offered- she is feeling "ok" after talking with the SNF rep there and will assist with the paperwork upon arrival.  Will plan transfer via EMS. Eduard Clos, MSW, [place]

## 2014-11-05 NOTE — Progress Notes (Signed)
Report called to [place] at [facility].  Awaiting PTAR for transfer.  Will continue to monitor until d/c.

## 2014-11-05 NOTE — Discharge Summary (Signed)
Physician Discharge Summary  [place] YPP:[id number] DOB: Mar 01, 1922 DOA: 10/31/2014  PCP: Mathews Argyle, MD  Admit date: 10/31/2014 Discharge date: 11/05/2014  Recommendations for Outpatient Follow-up:  1. Pt will need to follow up with PCP in 2 weeks post discharge 2. Follow up with ortho, Milly Jakob in 2-3 weeks   Discharge Diagnoses:  Bilateral sacral insufficiency fractures -Discussed with orthopedics, Dr. Domenic Schwab always with walker-->no surgical intervention -PT evaluation  -Control pain-->minimal-->d/c morphine -norco prn pain -Check 25 vitamin D--45.3 -start Oscal+D Cdiff colitis --11/03/14--started on flagyl 11/03/14 -The patient will continue on Flagyl for 8 additional days after discharge which will complete a 10 day course of therapy Fever -11/02/14 night--developed fever 102.49F -Obtain chest x-ray--no consolidation -likely due to Cdiff colitis -d/c IV vanco and cefepime -11/03/14--started on flagyl 11/03/14 -Blood cultures remained negative. The patient was afebrile for over 24 hours prior to discharge. Pyuria/Bacteruria -Concern about UTI--urine culture with multiple organisms -d/c ceftriaxone -d/c cefepime -pt had 3 days of abx Gait instability, mechanical falls  -PT evaluation-->SNF Aortic stenosis  -07/22/2014 echocardiogram EF 45-80%, grade 1 diastolic dysfunction, moderate to severe aortic stenosis  -Asymptomatic at this time  hyperlipidemia  -Continue statin  Hypothyroidism  -Continue Synthroid  Normocytic anemia -stable for outpt followup Night time aggitation -likely sundowning -d/c ambien -haldol prn agitation during the hospitalization  Discharge Condition: Stable  Disposition:  Follow-up Information    Follow up with THOMPSON, Malikhi Ogan P, MD In 2 weeks.   Specialty:  Orthopedic Surgery   Contact information:   [place] [address] [phone]       Follow up with Mathews Argyle,  MD In 2 weeks.   Specialty:  Internal Medicine   Contact information:   [address] [place] [address]      SNF  Diet:regular Wt Readings from Last 3 Encounters:  11/01/14 59.4 kg (130 lb 15.3 oz)  10/31/14 54.432 kg (120 lb)    History of present illness:  81 year old female with a history of aortic stenosis, hyperlipidemia, hypothyroidism presented with worsening back pain. The patient had a mechanical fall getting out of the bathroom 2 weeks prior to the admission. There was no loss of consciousness. Because of worsening low back pain the patient was brought to the emergency department. MRI of the lumbar spine was negative for any fractures but did show moderate spinal stenosis L2-3, L3-4 as well as bilateral sacral insufficiency fractures.[name] MRI of the hips confirmed bilateral nondisplaced sacral insufficiency fractures. There was also partial tears of her bilateral hamstrings. Orthopedic surgery was consulted.  Dr. Tamera Punt saw the patient and did not feel that she needed any surgical intervention. He recommended WBAT with close supervision. The patient was noted to have urinary tract infection. She was treated with 3 days of intravenous antibiotics. The patient developed a fever. Blood Cultures were obtained and were negative at the time of discharge. Unfortunately, the patient developed diarrhea. C. difficile PCR was positive. The patient was started on metronidazole. She will discharge with a additional days of metronidazole.   Discharge Exam: Filed Vitals:   11/05/14 0625  BP: 161/70  Pulse: 64  Temp: 97.8 F (36.6 C)  Resp: 20   Filed Vitals:   11/04/14 0636 11/04/14 1245 11/04/14 2213 11/05/14 0625  BP: 170/76 126/43 146/53 161/70  Pulse: 67 65 66 64  Temp: 97.6 F (36.4 C) 98.3 F (36.8 C) 99.7 F (37.6 C) 97.8 F (36.6 C)  TempSrc: Oral  Oral Oral Oral  Resp: 18 20 20 20   Height:      Weight:      SpO2: 98% 100% 98% 98%   General: Awake and  alert, NAD, pleasant, cooperative Cardiovascular: RRR, no rub, no gallop, no S3 Respiratory: Bibasilar crackles. No wheezes. Good air movement Abdomen:soft, nontender, nondistended, positive bowel sounds Extremities: No edema, No lymphangitis, no petechiae  Discharge Instructions      Discharge Instructions    Diet - low sodium heart healthy    Complete by:  As directed      Increase activity slowly    Complete by:  As directed             Medication List    STOP taking these medications        eszopiclone 2 MG Tabs tablet  Commonly known as:  LUNESTA      TAKE these medications        calcium-vitamin D 500-200 MG-UNIT per tablet  Commonly known as:  OSCAL WITH D  Take 1 tablet by mouth daily with breakfast.     clopidogrel 75 MG tablet  Commonly known as:  PLAVIX  Take 75 mg by mouth daily.     fexofenadine 180 MG tablet  Commonly known as:  ALLEGRA  Take 180 mg by mouth daily.     fluticasone 50 MCG/ACT nasal spray  Commonly known as:  FLONASE  Place into both nostrils daily.     HYDROcodone-acetaminophen 5-325 MG per tablet  Commonly known as:  NORCO/VICODIN  Take 1 tablet by mouth every 6 (six) hours as needed for moderate pain.     levothyroxine 75 MCG tablet  Commonly known as:  SYNTHROID, LEVOTHROID  Take 75 mcg by mouth daily before breakfast.     LORazepam 0.5 MG tablet  Commonly known as:  ATIVAN  Take 1 tablet (0.5 mg total) by mouth every 8 (eight) hours as needed for anxiety.     MELATIN PO  Take 1 tablet by mouth daily as needed (sleep if sleeping medication needs refills).     metroNIDAZOLE 500 MG tablet  Commonly known as:  FLAGYL  Take 1 tablet (500 mg total) by mouth every 8 (eight) hours.     montelukast 10 MG tablet  Commonly known as:  SINGULAIR  Take 10 mg by mouth at bedtime.     PRESERVISION AREDS PO  Take 1 tablet by mouth 2 (two) times daily.     simvastatin 20 MG tablet  Commonly known as:  ZOCOR  Take 20 mg by mouth  daily.         The results of significant diagnostics from this hospitalization (including imaging, microbiology, ancillary and laboratory) are listed below for reference.    Significant Diagnostic Studies: Dg Lumbar Spine Complete  10/31/2014   CLINICAL DATA:  Fall 2 weeks ago.  Low back and LEFT hip pain.  EXAM: LUMBAR SPINE - COMPLETE 4+ VIEW  COMPARISON:  None.  FINDINGS: L4-L5 posterior lumbar interbody fusion. Radiopaque leads are present over the fusion, likely for prior bones stimulator. No power pack is present. There is adjacent segment disease at L3-L4 with loss of the disc space and endplate sclerosis. Vertebral body height is preserved.  IMPRESSION: No acute osseous abnormality. L4-L5 fusion appears solid with severe L3-L4 adjacent segment degenerative disc disease.   Electronically Signed   By: Dereck Ligas M.D.   On: 10/31/2014 14:36   Ct Head Wo Contrast  11/01/2014   CLINICAL  DATA:  Status post fall, with acute onset of confusion. Initial encounter.  EXAM: CT HEAD WITHOUT CONTRAST  TECHNIQUE: Contiguous axial images were obtained from the base of the skull through the vertex without intravenous contrast.  COMPARISON:  MRI of the brain performed 11/05/2003  FINDINGS: There is no evidence of acute infarction, mass lesion, or intra- or extra-axial hemorrhage on CT.  Prominence of the ventricles and sulci reflects moderate cortical volume loss. Cerebellar atrophy is noted. Scattered periventricular and subcortical white matter change likely reflects small vessel ischemic microangiopathy.  The brainstem and fourth ventricle are within normal limits. The basal ganglia are unremarkable in appearance. The cerebral hemispheres demonstrate grossly normal gray-white differentiation. No mass effect or midline shift is seen.  There is no evidence of fracture; visualized osseous structures are unremarkable in appearance. The visualized portions of the orbits are within normal limits. The paranasal  sinuses and mastoid air cells are well-aerated. No significant soft tissue abnormalities are seen.  IMPRESSION: 1. No acute intracranial pathology seen on CT. 2. Moderate cortical volume loss and scattered small vessel ischemic microangiopathy.   Electronically Signed   By: Garald Balding M.D.   On: 11/01/2014 00:32   Mr Lumbar Spine Wo Contrast  10/31/2014   CLINICAL DATA:  Initial evaluation of low back pain and hip pain status post recent fall 2 weeks ago.  EXAM: MRI LUMBAR SPINE WITHOUT CONTRAST  TECHNIQUE: Multiplanar, multisequence MR imaging of the lumbar spine was performed. No intravenous contrast was administered.  COMPARISON:  Concomitant MRI the pelvis as well as previous radiographs from 06/25/2013.  FINDINGS: For the purposes of this dictation, the lowest well-formed intervertebral disc space is presumed to be the L5-S1 level, and there presumed to be 5 lumbar type vertebral bodies.  Patient is status post spinal fusion at L4-5 with extensive susceptibility artifact present within the lumbar spine in this region.  Vertebral body heights are preserved. No lumbar fracture. There is suspected trace anterolisthesis of L3 on L4. Vertebral bodies are otherwise normally aligned.  Abnormal T1 hypo intense, T2/STIR hyperintense signal intensity seen within the bilateral sacral ala, compatible with insufficiency fractures. These are nondisplaced. Otherwise, signal intensity within the vertebral body bone marrow is normal.  Conus medullaris terminates normally at the L1 level. Signal intensity within the visualized cord is unremarkable. Nerve roots of the cauda equina grossly within normal limits.  Paraspinous soft tissues within normal limits. Visualized visceral structures are unremarkable. No retroperitoneal adenopathy. There is mild aneurysmal dilatation of the intra-abdominal aorta up to 3.3 cm.  Mild fatty atrophy noted within the paraspinous musculature.  At T10-11 through T12-L1, there is no disc bulge  or disc protrusion. No significant canal or foraminal stenosis.  L1-2: Mild disc bulge without focal disc protrusion. There is bilateral facet hypertrophy with ligamentum flavum thickening. There is mild canal stenosis without significant foraminal narrowing.  L2-3: Mild diffuse disc bulge without focal disc protrusion. Mild bilateral facet arthrosis with ligamentum flavum thickening. There is resultant probable moderate canal stenosis, although evaluation somewhat limited due to susceptibility artifact from adjacent hardware. Moderate bilateral foraminal narrowing present as well, worse on the left.  L3-4: Status post spinal fusion. There is probable mild disc bulge with bilateral facet arthrosis. There is resultant moderate canal stenosis. Foramina not well evaluated at this level, however, at least moderate bilateral foraminal narrowing is suspected.  L4-5: Status post spinal fusion. This levels poorly evaluated due to susceptibility artifact.  L5-S1: No significant disc bulge or disc protrusion.  No canal stenosis. No definite foraminal narrowing.  IMPRESSION: 1. Bilateral sacral insufficiency fractures without displacement. Finding is better evaluated on concomitant MRI of the pelvis. 2. No other acute abnormality within the lumbar spine. 3. Status post spinal fusion at L4 and L5 without complication. 4. Degenerative disc bulge and facet hypertrophy at L2-3 with resultant moderate canal and bilateral foraminal stenosis, slightly worse on the left. 5. Mild degenerative disc bulge and facet arthrosis at L3-4 with resultant moderate canal stenosis. 6. Mild aneurysmal dilatation of the infrarenal aorta up to 3.3 cm.   Electronically Signed   By: Jeannine Boga M.D.   On: 10/31/2014 22:31   Mr Hip Left Wo Contrast  10/31/2014   CLINICAL DATA:  Status post fall.  Low back pain.  EXAM: MR OF THE LEFT HIP WITHOUT CONTRAST  TECHNIQUE: Multiplanar, multisequence MR imaging was performed. No intravenous contrast  was administered.  COMPARISON:  None.  FINDINGS: Bones: No hip fracture, dislocation or avascular necrosis. Marrow edema sacrum bilaterally consistent with sacral insufficiency fractures.  There is no other marrow signal abnormality.  Articular cartilage and labrum  Articular cartilage: Partial-thickness left superior hip cartilage loss. Mild right hip partial thickness cartilage loss.  Labrum:  No gross labral or paralabral abnormality.  Joint or bursal effusion  Joint effusion:  No joint effusion.  Bursae:  No bursa formation.  Muscles and tendons  Flexors: Normal.  Extensors: Normal.  Abductors: Normal.  Adductors: Normal.  Rotators: Normal.  Hamstrings: Very small partial tear is at the hamstring origin bilaterally.  Piriformis: Mild edema of the right piriformis muscle and adjacent to the right piriformis muscle most consistent with muscle strain.  Other findings  Miscellaneous: Diverticulosis without evidence of diverticulitis. No pelvic free fluid. No inguinal lymphadenopathy. No inguinal hernia.  IMPRESSION: 1. Acute, bilateral nondisplaced sacral insufficiency fractures. 2. No hip fracture, dislocation or avascular necrosis.   Electronically Signed   By: Kathreen Devoid   On: 10/31/2014 21:55   Dg Chest Port 1 View  11/03/2014   CLINICAL DATA:  Fever.  EXAM: PORTABLE CHEST - 1 VIEW  COMPARISON:  None.  FINDINGS: The heart size and mediastinal contours are within normal limits. Both lungs are clear. No evidence of pleural effusion. Mild tortuosity and atherosclerotic calcification of thoracic aorta noted.  IMPRESSION: No active disease.   Electronically Signed   By: Earle Gell M.D.   On: 11/03/2014 10:35   Dg Hip Unilat With Pelvis 2-3 Views Left  10/31/2014   CLINICAL DATA:  Fall 2 weeks ago.  Low back and LEFT hip pain.  EXAM: LEFT HIP (WITH PELVIS) 2-3 VIEWS  COMPARISON:  None.  FINDINGS: Pelvic rings appear intact. Hip joint spaces are preserved. Atherosclerosis. On the lateral view, LEFT hip shows  tiny marginal osteophytes compatible with mild osteoarthritis.  IMPRESSION: Mild LEFT hip osteoarthritis.  No acute osseous abnormality.   Electronically Signed   By: Dereck Ligas M.D.   On: 10/31/2014 14:35     Microbiology: Recent Results (from the past 240 hour(s))  Urine culture     Status: None   Collection Time: 10/31/14  7:10 PM  Result Value Ref Range Status   Specimen Description URINE, RANDOM  Final   Special Requests NONE  Final   Colony Count   Final    >=100,000 COLONIES/ML Performed at [facility]    Culture   Final    Multiple bacterial morphotypes present, none predominant. Suggest appropriate recollection if clinically indicated. Performed at  [facility]    Report Status 11/01/2014 FINAL  Final  Culture, blood (routine x 2)     Status: None (Preliminary result)   Collection Time: 11/03/14  2:30 AM  Result Value Ref Range Status   Specimen Description BLOOD RIGHT ARM  Final   Special Requests BOTTLES DRAWN AEROBIC AND ANAEROBIC 10CC  Final   Culture   Final           BLOOD CULTURE RECEIVED NO GROWTH TO DATE CULTURE WILL BE HELD FOR 5 DAYS BEFORE ISSUING A FINAL NEGATIVE REPORT Performed at [facility]    Report Status PENDING  Incomplete  Culture, blood (routine x 2)     Status: None (Preliminary result)   Collection Time: 11/03/14  2:40 AM  Result Value Ref Range Status   Specimen Description BLOOD LEFT ARM  Final   Special Requests BOTTLES DRAWN AEROBIC AND ANAEROBIC 10CC  Final   Culture   Final           BLOOD CULTURE RECEIVED NO GROWTH TO DATE CULTURE WILL BE HELD FOR 5 DAYS BEFORE ISSUING A FINAL NEGATIVE REPORT Performed at [facility]    Report Status PENDING  Incomplete  Clostridium Difficile by PCR     Status: Abnormal   Collection Time: 11/03/14  3:28 PM  Result Value Ref Range Status   C difficile by pcr POSITIVE (A) NEGATIVE Final    Comment: CRITICAL RESULT CALLED TO, READ BACK BY AND VERIFIED  WITH: Campbell Stall [id number] @ 1623 BY J SCOTTON      Labs: Basic Metabolic Panel:  Recent Labs Lab 10/31/14 1809 11/01/14 0400 11/03/14 0242 11/04/14 0940 11/05/14 0505  NA 138 137 133* 137 135  K 4.3 4.2 3.7 3.8 4.2  CL 106 105 105 106 106  CO2 26 24 21 25 24   GLUCOSE 86 92 181* 109* 95  BUN 18 16 21 20 18   CREATININE 0.93 0.93 1.10 0.91 0.94  CALCIUM 8.7 8.6 8.4 8.6 8.4   Liver Function Tests:  Recent Labs Lab 10/31/14 1809 11/01/14 0400 11/04/14 0940  AST 21 19 23   ALT 13 12 19   ALKPHOS 60 58 61  BILITOT 0.7 0.8 0.5  PROT 6.0 5.9* 6.1  ALBUMIN 3.1* 3.0* 2.8*   No results for input(s): LIPASE, AMYLASE in the last 168 hours. No results for input(s): AMMONIA in the last 168 hours. CBC:  Recent Labs Lab 10/31/14 1809 11/01/14 0400 11/03/14 0242 11/04/14 0940  WBC 5.9 5.6 8.1 7.4  NEUTROABS 3.7 3.8  --   --   HGB 10.3* 10.2* 10.7* 10.6*  HCT 31.7* 30.6* 31.9* 31.2*  MCV 95.5 95.6 92.2 92.6  PLT 156 167 171 185   Cardiac Enzymes:  Recent Labs Lab 11/01/14 0400  TROPONINI 0.03   BNP: Invalid input(s): POCBNP CBG: No results for input(s): GLUCAP in the last 168 hours.  Time coordinating discharge:  Greater than 30 minutes  Signed:  Greenlee Ancheta, DO [facility] Pager: [phone] 11/05/2014, 9:20 AM

## 2014-11-07 ENCOUNTER — Non-Acute Institutional Stay (SKILLED_NURSING_FACILITY): Payer: Medicare Other | Admitting: Internal Medicine

## 2014-11-07 DIAGNOSIS — R509 Fever, unspecified: Secondary | ICD-10-CM

## 2014-11-07 DIAGNOSIS — I350 Nonrheumatic aortic (valve) stenosis: Secondary | ICD-10-CM | POA: Diagnosis not present

## 2014-11-07 DIAGNOSIS — A047 Enterocolitis due to Clostridium difficile: Secondary | ICD-10-CM | POA: Diagnosis not present

## 2014-11-07 DIAGNOSIS — N390 Urinary tract infection, site not specified: Secondary | ICD-10-CM | POA: Diagnosis not present

## 2014-11-07 DIAGNOSIS — E038 Other specified hypothyroidism: Secondary | ICD-10-CM

## 2014-11-07 DIAGNOSIS — R451 Restlessness and agitation: Secondary | ICD-10-CM

## 2014-11-07 DIAGNOSIS — M8448XS Pathological fracture, other site, sequela: Secondary | ICD-10-CM | POA: Diagnosis not present

## 2014-11-07 DIAGNOSIS — E785 Hyperlipidemia, unspecified: Secondary | ICD-10-CM

## 2014-11-07 DIAGNOSIS — E034 Atrophy of thyroid (acquired): Secondary | ICD-10-CM | POA: Diagnosis not present

## 2014-11-07 DIAGNOSIS — A0472 Enterocolitis due to Clostridium difficile, not specified as recurrent: Secondary | ICD-10-CM

## 2014-11-07 LAB — VITAMIN D 1,25 DIHYDROXY
VITAMIN D 1, 25 (OH) TOTAL: 35 pg/mL
Vitamin D3 1, 25 (OH)2: 34 pg/mL

## 2014-11-07 NOTE — Progress Notes (Signed)
MRN: [id number] Name: Jacqueline Robles  Sex: female Age: 81 y.o. DOB: May 25, 1922  [place] #: [name] Facility/Room: 303 Level Of Care: SNF Provider: Inocencio Homes D Emergency Contacts: Extended Emergency Contact Information Primary Emergency Contact: Burton,[name] Address: [address]          [place] [address] [name] of [place] Phone: [phone] Mobile Phone: [phone] Relation: Daughter Secondary Emergency Contact: Kyung Rudd [place] of [place] Phone: [phone] Mobile Phone: [phone] Relation: Son    Allergies: Fosamax; Hctz; Lanolin; Latex; Neosporin; Norvasc; and Polysporin  Chief Complaint  Patient presents with  . New Admit To SNF    HPI: Patient is 81 y.o. female who is admitted to SNF for OT/PT after a fall with B sacral insufficiency fractures.  Past Medical History  Diagnosis Date  . Hypertension   . Thyroid disease   . Osteoporosis   . Depression   . Hyperlipidemia     Past Surgical History  Procedure Laterality Date  . Neck surgery    . Foot surgery        Medication List       This list is accurate as of: 11/07/14 11:59 PM.  Always use your most recent med list.               calcium-vitamin D 500-200 MG-UNIT per tablet  Commonly known as:  OSCAL WITH D  Take 1 tablet by mouth daily with breakfast.     clopidogrel 75 MG tablet  Commonly known as:  PLAVIX  Take 75 mg by mouth daily.     fexofenadine 180 MG tablet  Commonly known as:  ALLEGRA  Take 180 mg by mouth daily.     fluticasone 50 MCG/ACT nasal spray  Commonly known as:  FLONASE  Place into both nostrils daily.     HYDROcodone-acetaminophen 5-325 MG per tablet  Commonly known as:  NORCO/VICODIN  Take 1 tablet by mouth every 6 (six) hours as needed for moderate pain.     levothyroxine 75 MCG tablet  Commonly known as:  SYNTHROID, LEVOTHROID  Take 75 mcg by mouth daily before breakfast.     LORazepam 0.5 MG tablet  Commonly known as:   ATIVAN  Take 1 tablet (0.5 mg total) by mouth every 8 (eight) hours as needed for anxiety.     MELATIN PO  Take 1 tablet by mouth daily as needed (sleep if sleeping medication needs refills).     metroNIDAZOLE 500 MG tablet  Commonly known as:  FLAGYL  Take 1 tablet (500 mg total) by mouth every 8 (eight) hours.     montelukast 10 MG tablet  Commonly known as:  SINGULAIR  Take 10 mg by mouth at bedtime.     PRESERVISION AREDS PO  Take 1 tablet by mouth 2 (two) times daily.     simvastatin 20 MG tablet  Commonly known as:  ZOCOR  Take 20 mg by mouth daily.        No orders of the defined types were placed in this encounter.     There is no immunization history on file for this patient.  History  Substance Use Topics  . Smoking status: Never Smoker   . Smokeless tobacco: Not on file  . Alcohol Use: No    Family history is noncontributory    Review of Systems  DATA OBTAINED: from patient, nurse GENERAL:  no fevers,+ fatigue, no appetite changes SKIN: No itching, rash or wounds EYES: No eye pain, redness,  discharge EARS: No earache, tinnitus, change in hearing NOSE: No congestion, drainage or bleeding  MOUTH/THROAT: No mouth or tooth pain, No sore throat RESPIRATORY: No cough, wheezing, SOB CARDIAC: No chest pain, palpitations, lower extremity edema  GI: No abdominal pain, No N/V/D or constipation, No heartburn or reflux  GU: No dysuria, frequency or urgency, or incontinence  MUSCULOSKELETAL: No unrelieved bone/joint pain NEUROLOGIC: No headache, dizziness or focal weakness PSYCHIATRIC: No overt anxiety or sadness, No behavior issue.   Filed Vitals:   11/07/14 1455  BP: 118/75  Pulse: 84  Temp: 98 F (36.7 C)  Resp: 20    Physical Exam  GENERAL APPEARANCE: Alert, conversant,  No acute distress.  SKIN: No diaphoresis rash HEAD: Normocephalic, atraumatic  EYES: Conjunctiva/lids clear. Pupils round, reactive. EOMs intact.  EARS: External exam WNL,  canals clear. Hearing grossly normal.  NOSE: No deformity or discharge.  MOUTH/THROAT: Lips w/o lesions  RESPIRATORY: Breathing is even, unlabored. Lung sounds are clear   CARDIOVASCULAR: Heart RRR no murmurs, rubs or gallops. No peripheral edema.   GASTROINTESTINAL: Abdomen is soft, non-tender, not distended w/ normal bowel sounds. GENITOURINARY: Bladder non tender, not distended  MUSCULOSKELETAL: No abnormal joints or musculature NEUROLOGIC:  Cranial nerves 2-12 grossly intact PSYCHIATRIC: pt seems depressed, no behavioral issues  Patient Active Problem List   Diagnosis Date Noted  . Agitation 11/10/2014  . C. difficile colitis 11/04/2014  . Fever 11/03/2014  . Aortic stenosis 11/01/2014  . Hypothyroidism 11/01/2014  . Hyperlipidemia 11/01/2014  . UTI (lower urinary tract infection) 11/01/2014  . Sacral insufficiency fracture 11/01/2014  . Sacral fracture, closed 10/31/2014    CBC    Component Value Date/Time   WBC 7.4 11/04/2014 0940   RBC 3.37* 11/04/2014 0940   HGB 10.6* 11/04/2014 0940   HCT 31.2* 11/04/2014 0940   PLT 185 11/04/2014 0940   MCV 92.6 11/04/2014 0940   LYMPHSABS 0.9 11/01/2014 0400   MONOABS 0.6 11/01/2014 0400   EOSABS 0.2 11/01/2014 0400   BASOSABS 0.0 11/01/2014 0400    CMP     Component Value Date/Time   NA 135 11/05/2014 0505   K 4.2 11/05/2014 0505   CL 106 11/05/2014 0505   CO2 24 11/05/2014 0505   GLUCOSE 95 11/05/2014 0505   BUN 18 11/05/2014 0505   CREATININE 0.94 11/05/2014 0505   CALCIUM 8.4 11/05/2014 0505   PROT 6.1 11/04/2014 0940   ALBUMIN 2.8* 11/04/2014 0940   AST 23 11/04/2014 0940   ALT 19 11/04/2014 0940   ALKPHOS 61 11/04/2014 0940   BILITOT 0.5 11/04/2014 0940   GFRNONAA 52* 11/05/2014 0505   GFRAA 60* 11/05/2014 0505    Assessment and Plan  Sacral insufficiency fracture Bilateral sacral insufficiency fractures -Discussed with orthopedics, Dr. Domenic Schwab always with walker-->no surgical intervention -PT  evaluation  -Control pain-->minimal-->d/c morphine -norco prn pain -Check 25 vitamin D--45.3 -start Oscal+D   C. difficile colitis 11/03/14--started on flagyl 11/03/14 -The patient will continue on Flagyl for 8 additional days after discharge which will complete a 10 day course of therapy   Fever 11/02/14 night--developed fever 102.46F -Obtain chest x-ray--no consolidation -likely due to Cdiff colitis -d/c IV vanco and cefepime -11/03/14--started on flagyl 11/03/14 -Blood cultures remained negative. The patient was afebrile for over 24 hours prior to discharge   UTI (lower urinary tract infection) Concern about UTI--urine culture with multiple organisms -d/c ceftriaxone -d/c cefepime -pt had 3 days of abx   Aortic stenosis 07/22/2014 echocardiogram EF 62-95%, grade 1 diastolic dysfunction,  moderate to severe aortic stenosis  -Asymptomatic at this time    Agitation likely sundowning -d/c ambien -haldol prn agitation during the hospitalization   Hyperlipidemia Continue zocor   Hypothyroidism TSH 3.048;continue curent dose synthroid     Hennie Duos, MD

## 2014-11-09 LAB — CULTURE, BLOOD (ROUTINE X 2)
CULTURE: NO GROWTH
Culture: NO GROWTH

## 2014-11-10 ENCOUNTER — Encounter: Payer: Self-pay | Admitting: Internal Medicine

## 2014-11-10 DIAGNOSIS — R451 Restlessness and agitation: Secondary | ICD-10-CM | POA: Insufficient documentation

## 2014-11-10 NOTE — Assessment & Plan Note (Signed)
11/02/14 night--developed fever 102.53F -Obtain chest x-ray--no consolidation -likely due to Cdiff colitis -d/c IV vanco and cefepime -11/03/14--started on flagyl 11/03/14 -Blood cultures remained negative. The patient was afebrile for over 24 hours prior to discharge

## 2014-11-10 NOTE — Assessment & Plan Note (Signed)
Concern about UTI--urine culture with multiple organisms -d/c ceftriaxone -d/c cefepime -pt had 3 days of abx

## 2014-11-10 NOTE — Assessment & Plan Note (Signed)
likely sundowning -d/c ambien -haldol prn agitation during the hospitalization

## 2014-11-10 NOTE — Assessment & Plan Note (Signed)
07/22/2014 echocardiogram EF 93-57%, grade 1 diastolic dysfunction, moderate to severe aortic stenosis  -Asymptomatic at this time

## 2014-11-10 NOTE — Assessment & Plan Note (Signed)
11/03/14--started on flagyl 11/03/14 -The patient will continue on Flagyl for 8 additional days after discharge which will complete a 10 day course of therapy

## 2014-11-10 NOTE — Assessment & Plan Note (Signed)
Continue zocor 

## 2014-11-10 NOTE — Assessment & Plan Note (Signed)
TSH 3.048;continue curent dose synthroid

## 2014-11-10 NOTE — Assessment & Plan Note (Signed)
Bilateral sacral insufficiency fractures -Discussed with orthopedics, Dr. Domenic Schwab always with walker-->no surgical intervention -PT evaluation  -Control pain-->minimal-->d/c morphine -norco prn pain -Check 25 vitamin D--45.3 -start Oscal+D

## 2014-11-12 ENCOUNTER — Non-Acute Institutional Stay (SKILLED_NURSING_FACILITY): Payer: Medicare Other | Admitting: Nurse Practitioner

## 2014-11-12 DIAGNOSIS — A047 Enterocolitis due to Clostridium difficile: Secondary | ICD-10-CM

## 2014-11-12 DIAGNOSIS — A0472 Enterocolitis due to Clostridium difficile, not specified as recurrent: Secondary | ICD-10-CM

## 2014-11-12 NOTE — Progress Notes (Signed)
Patient ID: Jacqueline Robles, female   DOB: 09-Mar-1922, 81 y.o.   MRN: [id number]    Nursing Home Location:  [facility]   Place of Service: SNF (31)  PCP: Mathews Argyle, MD  Allergies  Allergen Reactions  . Fosamax [Alendronate Sodium]   . Hctz [Hydrochlorothiazide]   . Lanolin   . Latex   . Neosporin [Neomycin-Bacitracin Zn-Polymyx]   . Norvasc [Amlodipine Besylate]   . Polysporin [Bacitracin-Polymyxin B]     rash    Chief Complaint  Patient presents with  . Acute Visit    HPI:  Patient is a 81 y.o. female seen today at [facility] for acute visit. Pt admitted to [facility] after hospitalization for fracture and c diff. conts on flagyl for a 10 day course which is due to stop tomorrow. Staff concerned due to on going foul, watery stools that cont to be frequent and pt reports ongoing abdominal pain. No fevers or chills noted. Pt conts to eat and drink Review of Systems:  Review of Systems  Constitutional: Positive for fatigue. Negative for activity change, appetite change and unexpected weight change.  Eyes: Negative.   Respiratory: Negative for cough and shortness of breath.   Cardiovascular: Negative for chest pain and palpitations.  Gastrointestinal: Positive for abdominal pain and diarrhea. Negative for nausea and constipation.  Genitourinary: Negative for dysuria and difficulty urinating.  Neurological: Positive for weakness. Negative for dizziness.    Past Medical History  Diagnosis Date  . Hypertension   . Thyroid disease   . Osteoporosis   . Depression   . Hyperlipidemia    Past Surgical History  Procedure Laterality Date  . Neck surgery    . Foot surgery     Social History:   reports that she has never smoked. She does not have any smokeless tobacco history on file. She reports that she does not drink alcohol or use illicit drugs.  Family History  Problem Relation Age of Onset  . Hypertension Son      Medications: Patient's Medications  New Prescriptions   No medications on file  Previous Medications   CALCIUM-VITAMIN D (OSCAL WITH D) 500-200 MG-UNIT PER TABLET    Take 1 tablet by mouth daily with breakfast.   CLOPIDOGREL (PLAVIX) 75 MG TABLET    Take 75 mg by mouth daily.   FEXOFENADINE (ALLEGRA) 180 MG TABLET    Take 180 mg by mouth daily.   FLUTICASONE (FLONASE) 50 MCG/ACT NASAL SPRAY    Place into both nostrils daily.   HYDROCODONE-ACETAMINOPHEN (NORCO/VICODIN) 5-325 MG PER TABLET    Take 1 tablet by mouth every 6 (six) hours as needed for moderate pain.   LEVOTHYROXINE (SYNTHROID, LEVOTHROID) 75 MCG TABLET    Take 75 mcg by mouth daily before breakfast.   LORAZEPAM (ATIVAN) 0.5 MG TABLET    Take 1 tablet (0.5 mg total) by mouth every 8 (eight) hours as needed for anxiety.   MELATONIN-PYRIDOXINE (MELATIN PO)    Take 1 tablet by mouth daily as needed (sleep if sleeping medication needs refills).    METRONIDAZOLE (FLAGYL) 500 MG TABLET    Take 1 tablet (500 mg total) by mouth every 8 (eight) hours.   MONTELUKAST (SINGULAIR) 10 MG TABLET    Take 10 mg by mouth at bedtime.   MULTIPLE VITAMINS-MINERALS (PRESERVISION AREDS PO)    Take 1 tablet by mouth 2 (two) times daily.    SIMVASTATIN (ZOCOR) 20 MG TABLET    Take 20  mg by mouth daily.  Modified Medications   No medications on file  Discontinued Medications   No medications on file     Physical Exam: Filed Vitals:   11/12/14 1341  BP: 140/65  Pulse: 68  Temp: 97 F (36.1 C)  Resp: 20    Physical Exam  Constitutional:  Thin female in NAD  HENT:  Head: Normocephalic and atraumatic.  Mouth/Throat: Oropharynx is clear and moist. No oropharyngeal exudate.  Cardiovascular: Normal rate and regular rhythm.   Murmur heard. Pulmonary/Chest: Effort normal and breath sounds normal.  Abdominal: Soft. Bowel sounds are normal. She exhibits no distension. There is no tenderness. There is no rebound.  Neurological: She is alert.   Skin: Skin is warm.  Psychiatric: She has a normal mood and affect.    Labs reviewed: Basic Metabolic Panel:  Recent Labs  11/03/14 0242 11/04/14 0940 11/05/14 0505  NA 133* 137 135  K 3.7 3.8 4.2  CL 105 106 106  CO2 21 25 24   GLUCOSE 181* 109* 95  BUN 21 20 18   CREATININE 1.10 0.91 0.94  CALCIUM 8.4 8.6 8.4   Liver Function Tests:  Recent Labs  10/31/14 1809 11/01/14 0400 11/04/14 0940  AST 21 19 23   ALT 13 12 19   ALKPHOS 60 58 61  BILITOT 0.7 0.8 0.5  PROT 6.0 5.9* 6.1  ALBUMIN 3.1* 3.0* 2.8*   No results for input(s): LIPASE, AMYLASE in the last 8760 hours. No results for input(s): AMMONIA in the last 8760 hours. CBC:  Recent Labs  10/31/14 1809 11/01/14 0400 11/03/14 0242 11/04/14 0940  WBC 5.9 5.6 8.1 7.4  NEUTROABS 3.7 3.8  --   --   HGB 10.3* 10.2* 10.7* 10.6*  HCT 31.7* 30.6* 31.9* 31.2*  MCV 95.5 95.6 92.2 92.6  PLT 156 167 171 185   TSH:  Recent Labs  11/03/14 0242  TSH 3.048   A1C: No results found for: HGBA1C Lipid Panel: No results for input(s): CHOL, HDL, LDLCALC, TRIG, CHOLHDL, LDLDIRECT in the last 8760 hours.   Assessment/Plan 1. C. difficile colitis -pt still with watery foul odor and frequent stool. flagyl due to stop tomorrow. Will extend course of treatment for 5 more days. Will have staff draw CBC with diff in 5 days as well. conts VS q 8 hours.  Encouraged fluids

## 2014-11-22 ENCOUNTER — Non-Acute Institutional Stay (SKILLED_NURSING_FACILITY): Payer: Medicare Other | Admitting: Internal Medicine

## 2014-11-22 ENCOUNTER — Encounter: Payer: Self-pay | Admitting: Internal Medicine

## 2014-11-22 DIAGNOSIS — L22 Diaper dermatitis: Secondary | ICD-10-CM | POA: Diagnosis not present

## 2014-11-22 DIAGNOSIS — A0472 Enterocolitis due to Clostridium difficile, not specified as recurrent: Secondary | ICD-10-CM

## 2014-11-22 DIAGNOSIS — B372 Candidiasis of skin and nail: Secondary | ICD-10-CM

## 2014-11-22 DIAGNOSIS — A047 Enterocolitis due to Clostridium difficile: Secondary | ICD-10-CM

## 2014-11-22 DIAGNOSIS — Z7189 Other specified counseling: Secondary | ICD-10-CM | POA: Diagnosis not present

## 2014-11-22 NOTE — Progress Notes (Signed)
MRN: [id number] Name: Jacqueline Robles  Sex: female Age: 81 y.o. DOB: 07-01-1922  [place] #: Jacqueline Robles Facility/Room: Level Of Care: SNF Provider: Inocencio Homes D Emergency Contacts: Extended Emergency Contact Information Primary Emergency Contact: Robles,Jacqueline Address: [address]          [place] [address] Jacqueline Robles of [place] Phone: [phone] Mobile Phone: [phone] Relation: Daughter Secondary Emergency Contact: Jacqueline Robles [place] Phone: [phone] Mobile Phone: [phone] Relation: Son  Code Status: DNR  Allergies: Fosamax; Hctz; Lanolin; Latex; Neosporin; Norvasc; and Polysporin  Chief Complaint  Patient presents with  . Acute Visit  . family conference with patient present    HPI: Patient is 81 y.o. female who is being seen for a last minute family conference and several acute issues.  Past Medical History  Diagnosis Date  . Hypertension   . Thyroid disease   . Osteoporosis   . Depression   . Hyperlipidemia     Past Surgical History  Procedure Laterality Date  . Neck surgery    . Foot surgery        Medication List       This list is accurate as of: 11/22/14 11:59 PM.  Always use your most recent med list.               calcium-vitamin D 500-200 MG-UNIT per tablet  Commonly known as:  OSCAL WITH D  Take 1 tablet by mouth daily with breakfast.     clopidogrel 75 MG tablet  Commonly known as:  PLAVIX  Take 75 mg by mouth daily.     fexofenadine 180 MG tablet  Commonly known as:  ALLEGRA  Take 180 mg by mouth daily.     fluticasone 50 MCG/ACT nasal spray  Commonly known as:  FLONASE  Place into both nostrils daily.     HYDROcodone-acetaminophen 5-325 MG per tablet  Commonly known as:  NORCO/VICODIN  Take 1 tablet by mouth every 6 (six) hours as needed for moderate pain.     levothyroxine 75 MCG tablet  Commonly known as:  SYNTHROID, LEVOTHROID  Take 75 mcg by mouth daily before breakfast.     LORazepam 0.5 MG tablet  Commonly known as:  ATIVAN  Take 1 tablet (0.5 mg total) by mouth every 8 (eight) hours as needed for anxiety.     MELATIN PO  Take 1 tablet by mouth daily as needed (sleep if sleeping medication needs refills).     metroNIDAZOLE 500 MG tablet  Commonly known as:  FLAGYL  Take 1 tablet (500 mg total) by mouth every 8 (eight) hours.     montelukast 10 MG tablet  Commonly known as:  SINGULAIR  Take 10 mg by mouth at bedtime.     PRESERVISION AREDS PO  Take 1 tablet by mouth 2 (two) times daily.     simvastatin 20 MG tablet  Commonly known as:  ZOCOR  Take 20 mg by mouth daily.        No orders of the defined types were placed in this encounter.     There is no immunization history on file for this patient.  History  Substance Use Topics  . Smoking status: Never Smoker   . Smokeless tobacco: Not on file  . Alcohol Use: No    Review of Systems  DATA OBTAINED: from patient, nurse, family member GENERAL:  no fevers, fatigue, appetite changes SKIN:  itchy rash in GU area HEENT: No complaint RESPIRATORY: No cough,  wheezing, SOB CARDIAC: No chest pain, palpitations, lower extremity edema  GI: No abdominal pain, No N/V or constipation,diarrhea is better, No heartburn or reflux  GU: No dysuria, frequency or urgency, or incontinence  MUSCULOSKELETAL: No unrelieved bone/joint pain NEUROLOGIC: No headache, dizziness  PSYCHIATRIC: No overt anxiety or sadness  Filed Vitals:   11/22/14 1859  BP: 140/65  Pulse: 68  Temp: 97 F (36.1 C)  Resp: 20    Physical Exam  GENERAL APPEARANCE: Alert, conversant, No acute distress  SKIN: erythematous rash GU and proximal thighs; looks like yeast and local irritation from diarrhea. HEENT: Unremarkable RESPIRATORY: Breathing is even, unlabored. Lung sounds are clear   CARDIOVASCULAR: Heart RRR no murmurs, rubs or gallops. No peripheral edema  GASTROINTESTINAL: Abdomen is soft, non-tender, not distended w/  normal bowel sounds.  GENITOURINARY: Bladder non tender, not distended  MUSCULOSKELETAL: No abnormal joints or musculature NEUROLOGIC: Cranial nerves 2-12 grossly intact PSYCHIATRIC: dementia, no behavioral issues  Patient Active Problem List   Diagnosis Date Noted  . Encounter for family conference with patient present 12/04/2014  . Candidal diaper dermatitis 12/04/2014  . Agitation 11/10/2014  . C. difficile colitis 11/04/2014  . Fever 11/03/2014  . Aortic stenosis 11/01/2014  . Hypothyroidism 11/01/2014  . Hyperlipidemia 11/01/2014  . UTI (lower urinary tract infection) 11/01/2014  . Sacral insufficiency fracture 11/01/2014  . Sacral fracture, closed 10/31/2014    CBC    Component Value Date/Time   WBC 7.4 11/04/2014 0940   RBC 3.37* 11/04/2014 0940   HGB 10.6* 11/04/2014 0940   HCT 31.2* 11/04/2014 0940   PLT 185 11/04/2014 0940   MCV 92.6 11/04/2014 0940   LYMPHSABS 0.9 11/01/2014 0400   MONOABS 0.6 11/01/2014 0400   EOSABS 0.2 11/01/2014 0400   BASOSABS 0.0 11/01/2014 0400    CMP     Component Value Date/Time   NA 135 11/05/2014 0505   K 4.2 11/05/2014 0505   CL 106 11/05/2014 0505   CO2 24 11/05/2014 0505   GLUCOSE 95 11/05/2014 0505   BUN 18 11/05/2014 0505   CREATININE 0.94 11/05/2014 0505   CALCIUM 8.4 11/05/2014 0505   PROT 6.1 11/04/2014 0940   ALBUMIN 2.8* 11/04/2014 0940   AST 23 11/04/2014 0940   ALT 19 11/04/2014 0940   ALKPHOS 61 11/04/2014 0940   BILITOT 0.5 11/04/2014 0940   GFRNONAA 52* 11/05/2014 0505   GFRAA 60* 11/05/2014 0505    Assessment and Plan  Encounter for family conference with patient present Spoke with daughter and son-in-law with DON present;discussed multiple issues, major ones being C. Diff and pt's new GU skin irritation that I had discussed with DON yesterday per phone and had started diflucan for. We spoke for quite a while and I answered all their questions and they appeared satisfied.   C. difficile colitis Pt's  flagyl was prolonged for 5 dauys 2/2 pt still having diarrhea; her c. Diff test after tx was completed was NEG; shared this finding with family   Candidal diaper dermatitis Pt has been ordered for diflucan 150 mg q 72 X 2, this was prolonged to totally 3 doses     Hennie Duos, MD

## 2014-12-03 ENCOUNTER — Non-Acute Institutional Stay (SKILLED_NURSING_FACILITY): Payer: Medicare Other | Admitting: Nurse Practitioner

## 2014-12-03 DIAGNOSIS — H6121 Impacted cerumen, right ear: Secondary | ICD-10-CM | POA: Diagnosis not present

## 2014-12-03 NOTE — Progress Notes (Signed)
Patient ID: Jacqueline Robles, female   DOB: Jan 09, 1922, 81 y.o.   MRN: [id number]    Nursing Home Location:  [facility]   Place of Service: SNF (31)  PCP: Mathews Argyle, MD  Allergies  Allergen Reactions  . Fosamax [Alendronate Sodium]   . Hctz [Hydrochlorothiazide]   . Lanolin   . Latex   . Neosporin [Neomycin-Bacitracin Zn-Polymyx]   . Norvasc [Amlodipine Besylate]   . Polysporin [Bacitracin-Polymyxin B]     rash    Chief Complaint  Patient presents with  . Acute Visit    HPI:  Patient is a 81 y.o. female seen today at [facility] for acute visit. Pt has been complaining of ear pain to staff over the past several days. Reports she is getting tylenol which helps the pain. Pt is very HOH, hearing device to left ear. Denies fullness in ears. No current pain at this time. Reports tylenol has helped.   Review of Systems:  Review of Systems  Constitutional: Negative for activity change, appetite change and unexpected weight change.  HENT: Positive for ear pain and hearing loss. Negative for ear discharge, postnasal drip, rhinorrhea and sinus pressure.   Eyes: Negative.   Respiratory: Negative for cough and shortness of breath.   Cardiovascular: Negative for chest pain and palpitations.  Neurological: Negative for dizziness.    Past Medical History  Diagnosis Date  . Hypertension   . Thyroid disease   . Osteoporosis   . Depression   . Hyperlipidemia    Past Surgical History  Procedure Laterality Date  . Neck surgery    . Foot surgery     Social History:   reports that she has never smoked. She does not have any smokeless tobacco history on file. She reports that she does not drink alcohol or use illicit drugs.  Family History  Problem Relation Age of Onset  . Hypertension Son     Medications: Patient's Medications  New Prescriptions   No medications on file  Previous Medications   CALCIUM-VITAMIN D (OSCAL WITH D) 500-200  MG-UNIT PER TABLET    Take 1 tablet by mouth daily with breakfast.   CLOPIDOGREL (PLAVIX) 75 MG TABLET    Take 75 mg by mouth daily.   FEXOFENADINE (ALLEGRA) 180 MG TABLET    Take 180 mg by mouth daily.   FLUTICASONE (FLONASE) 50 MCG/ACT NASAL SPRAY    Place into both nostrils daily.   HYDROCODONE-ACETAMINOPHEN (NORCO/VICODIN) 5-325 MG PER TABLET    Take 1 tablet by mouth every 6 (six) hours as needed for moderate pain.   LEVOTHYROXINE (SYNTHROID, LEVOTHROID) 75 MCG TABLET    Take 75 mcg by mouth daily before breakfast.   LORAZEPAM (ATIVAN) 0.5 MG TABLET    Take 1 tablet (0.5 mg total) by mouth every 8 (eight) hours as needed for anxiety.   MELATONIN-PYRIDOXINE (MELATIN PO)    Take 1 tablet by mouth daily as needed (sleep if sleeping medication needs refills).    METRONIDAZOLE (FLAGYL) 500 MG TABLET    Take 1 tablet (500 mg total) by mouth every 8 (eight) hours.   MONTELUKAST (SINGULAIR) 10 MG TABLET    Take 10 mg by mouth at bedtime.   MULTIPLE VITAMINS-MINERALS (PRESERVISION AREDS PO)    Take 1 tablet by mouth 2 (two) times daily.    SIMVASTATIN (ZOCOR) 20 MG TABLET    Take 20 mg by mouth daily.  Modified Medications   No medications on file  Discontinued  Medications   No medications on file     Physical Exam: Filed Vitals:   12/03/14 1557  BP: 112/56  Pulse: 66  Temp: 97.6 F (36.4 C)  Resp: 20    Physical Exam  Constitutional:  Thin female in NAD  HENT:  Head: Normocephalic and atraumatic.  Right Ear: External ear normal. No drainage, swelling or tenderness. Decreased hearing is noted.  Left Ear: External ear normal.  Nose: Nose normal.  Mouth/Throat: Oropharynx is clear and moist. No oropharyngeal exudate.  Cerumen impaction to right ear  Neck: Normal range of motion. Neck supple.  Cardiovascular: Normal rate and regular rhythm.   Murmur heard. Pulmonary/Chest: Effort normal and breath sounds normal.  Abdominal: Soft. Bowel sounds are normal.  Lymphadenopathy:    She  has no cervical adenopathy.  Neurological: She is alert.  Skin: Skin is warm.  Psychiatric: She has a normal mood and affect.    Labs reviewed: Basic Metabolic Panel:  Recent Labs  11/03/14 0242 11/04/14 0940 11/05/14 0505  NA 133* 137 135  K 3.7 3.8 4.2  CL 105 106 106  CO2 21 25 24   GLUCOSE 181* 109* 95  BUN 21 20 18   CREATININE 1.10 0.91 0.94  CALCIUM 8.4 8.6 8.4   Liver Function Tests:  Recent Labs  10/31/14 1809 11/01/14 0400 11/04/14 0940  AST 21 19 23   ALT 13 12 19   ALKPHOS 60 58 61  BILITOT 0.7 0.8 0.5  PROT 6.0 5.9* 6.1  ALBUMIN 3.1* 3.0* 2.8*   No results for input(s): LIPASE, AMYLASE in the last 8760 hours. No results for input(s): AMMONIA in the last 8760 hours. CBC:  Recent Labs  10/31/14 1809 11/01/14 0400 11/03/14 0242 11/04/14 0940  WBC 5.9 5.6 8.1 7.4  NEUTROABS 3.7 3.8  --   --   HGB 10.3* 10.2* 10.7* 10.6*  HCT 31.7* 30.6* 31.9* 31.2*  MCV 95.5 95.6 92.2 92.6  PLT 156 167 171 185   TSH:  Recent Labs  11/03/14 0242  TSH 3.048   A1C: No results found for: HGBA1C Lipid Panel: No results for input(s): CHOL, HDL, LDLCALC, TRIG, CHOLHDL, LDLDIRECT in the last 8760 hours.   Assessment/Plan  1. Cerumen impaction, right -debrox 5 drops into right ear BID for 3 days then staff to flush ear.

## 2014-12-04 ENCOUNTER — Encounter: Payer: Self-pay | Admitting: Internal Medicine

## 2014-12-04 DIAGNOSIS — B372 Candidiasis of skin and nail: Secondary | ICD-10-CM | POA: Insufficient documentation

## 2014-12-04 DIAGNOSIS — Z7189 Other specified counseling: Secondary | ICD-10-CM | POA: Insufficient documentation

## 2014-12-04 DIAGNOSIS — L22 Diaper dermatitis: Secondary | ICD-10-CM

## 2014-12-04 NOTE — Assessment & Plan Note (Signed)
Spoke with daughter and son-in-law with DON present;discussed multiple issues, major ones being C. Diff and pt's new GU skin irritation that I had discussed with DON yesterday per phone and had started diflucan for. We spoke for quite a while and I answered all their questions and they appeared satisfied.

## 2014-12-04 NOTE — Assessment & Plan Note (Addendum)
Pt has been ordered for diflucan 150 mg q 72 X 2, this was prolonged to totally 3 doses; will continue barrier cream as well

## 2014-12-04 NOTE — Assessment & Plan Note (Signed)
Pt's flagyl was prolonged for 5 dauys 2/2 pt still having diarrhea; her c. Diff test after tx was completed was NEG; shared this finding with family

## 2014-12-06 ENCOUNTER — Non-Acute Institutional Stay (SKILLED_NURSING_FACILITY): Payer: Medicare Other | Admitting: Nurse Practitioner

## 2014-12-06 DIAGNOSIS — J309 Allergic rhinitis, unspecified: Secondary | ICD-10-CM | POA: Diagnosis not present

## 2014-12-06 DIAGNOSIS — M8448XS Pathological fracture, other site, sequela: Secondary | ICD-10-CM

## 2014-12-06 DIAGNOSIS — E038 Other specified hypothyroidism: Secondary | ICD-10-CM | POA: Diagnosis not present

## 2014-12-06 DIAGNOSIS — A0472 Enterocolitis due to Clostridium difficile, not specified as recurrent: Secondary | ICD-10-CM

## 2014-12-06 DIAGNOSIS — E785 Hyperlipidemia, unspecified: Secondary | ICD-10-CM | POA: Diagnosis not present

## 2014-12-06 DIAGNOSIS — A047 Enterocolitis due to Clostridium difficile: Secondary | ICD-10-CM | POA: Diagnosis not present

## 2014-12-06 DIAGNOSIS — E034 Atrophy of thyroid (acquired): Secondary | ICD-10-CM | POA: Diagnosis not present

## 2014-12-06 DIAGNOSIS — I350 Nonrheumatic aortic (valve) stenosis: Secondary | ICD-10-CM

## 2014-12-06 NOTE — Progress Notes (Signed)
Patient ID: Jacqueline Robles, female   DOB: 21-Jul-1922, 81 y.o.   MRN: [id number]    Nursing Home Location:  [facility]   Place of Service: SNF (31)  PCP: Mathews Argyle, MD  Allergies  Allergen Reactions  . Fosamax [Alendronate Sodium]   . Hctz [Hydrochlorothiazide]   . Lanolin   . Latex   . Neosporin [Neomycin-Bacitracin Zn-Polymyx]   . Norvasc [Amlodipine Besylate]   . Polysporin [Bacitracin-Polymyxin B]     rash    Chief Complaint  Patient presents with  . Discharge Note    HPI:  Patient is a 81 y.o. female seen today at [facility] for discharge home. Pt with recent hospitalization due to C Diff and was transferred to [facility] for ongoing rehab and extended antibiotics. Bowels moving well without complaints. Was seen 3 days ago due to cerumen impaction, drops started yesterday. Patient currently doing well with therapy, now stable to discharge home with family and with home health thearpy.  Review of Systems:  Review of Systems  Constitutional: Negative for activity change, appetite change and unexpected weight change.  HENT: Positive for hearing loss. Negative for ear discharge, ear pain, postnasal drip, rhinorrhea and sinus pressure.   Eyes: Negative.   Respiratory: Negative for cough and shortness of breath.   Cardiovascular: Negative for chest pain and palpitations.  Gastrointestinal: Negative for diarrhea, constipation and abdominal distention.  Genitourinary: Negative for difficulty urinating.  Musculoskeletal: Negative for myalgias and arthralgias.  Neurological: Negative for dizziness and weakness.  Psychiatric/Behavioral: Positive for confusion (STML). Negative for agitation.    Past Medical History  Diagnosis Date  . Hypertension   . Thyroid disease   . Osteoporosis   . Depression   . Hyperlipidemia    Past Surgical History  Procedure Laterality Date  . Neck surgery    . Foot surgery     Social History:  reports that she has never smoked. She does not have any smokeless tobacco history on file. She reports that she does not drink alcohol or use illicit drugs.  Family History  Problem Relation Age of Onset  . Hypertension Son     Medications: Patient's Medications  New Prescriptions   No medications on file  Previous Medications   CALCIUM-VITAMIN D (OSCAL WITH D) 500-200 MG-UNIT PER TABLET    Take 1 tablet by mouth daily with breakfast.   CLOPIDOGREL (PLAVIX) 75 MG TABLET    Take 75 mg by mouth daily.   FEXOFENADINE (ALLEGRA) 180 MG TABLET    Take 180 mg by mouth daily.   FLUTICASONE (FLONASE) 50 MCG/ACT NASAL SPRAY    Place into both nostrils daily.   HYDROCODONE-ACETAMINOPHEN (NORCO/VICODIN) 5-325 MG PER TABLET    Take 1 tablet by mouth every 6 (six) hours as needed for moderate pain.   LEVOTHYROXINE (SYNTHROID, LEVOTHROID) 75 MCG TABLET    Take 75 mcg by mouth daily before breakfast.   LORAZEPAM (ATIVAN) 0.5 MG TABLET    Take 1 tablet (0.5 mg total) by mouth every 8 (eight) hours as needed for anxiety.   MELATONIN-PYRIDOXINE (MELATIN PO)    Take 1 tablet by mouth daily as needed (sleep if sleeping medication needs refills).    METRONIDAZOLE (FLAGYL) 500 MG TABLET    Take 1 tablet (500 mg total) by mouth every 8 (eight) hours.   MONTELUKAST (SINGULAIR) 10 MG TABLET    Take 10 mg by mouth at bedtime.   MULTIPLE VITAMINS-MINERALS (PRESERVISION AREDS PO)  Take 1 tablet by mouth 2 (two) times daily.    SIMVASTATIN (ZOCOR) 20 MG TABLET    Take 20 mg by mouth daily.  Modified Medications   No medications on file  Discontinued Medications   No medications on file     Physical Exam: Filed Vitals:   12/06/14 1047  BP: 105/58  Pulse: 68  Temp: 97.5 F (36.4 C)  Resp: 20    Physical Exam  Constitutional:  Thin female in NAD  HENT:  Head: Normocephalic and atraumatic.  Right Ear: External ear normal. No drainage, swelling or tenderness. Decreased hearing is noted.  Left Ear:  External ear normal.  Nose: Nose normal.  Mouth/Throat: Oropharynx is clear and moist. No oropharyngeal exudate.  Cerumen impaction to right ear  Neck: Normal range of motion. Neck supple.  Cardiovascular: Normal rate and regular rhythm.   Murmur heard. Pulmonary/Chest: Effort normal and breath sounds normal.  Abdominal: Soft. Bowel sounds are normal.  Lymphadenopathy:    She has no cervical adenopathy.  Neurological: She is alert.  Skin: Skin is warm.  Psychiatric: She has a normal mood and affect.    Labs reviewed: Basic Metabolic Panel:  Recent Labs  11/03/14 0242 11/04/14 0940 11/05/14 0505  NA 133* 137 135  K 3.7 3.8 4.2  CL 105 106 106  CO2 21 25 24   GLUCOSE 181* 109* 95  BUN 21 20 18   CREATININE 1.10 0.91 0.94  CALCIUM 8.4 8.6 8.4   Liver Function Tests:  Recent Labs  10/31/14 1809 11/01/14 0400 11/04/14 0940  AST 21 19 23   ALT 13 12 19   ALKPHOS 60 58 61  BILITOT 0.7 0.8 0.5  PROT 6.0 5.9* 6.1  ALBUMIN 3.1* 3.0* 2.8*   No results for input(s): LIPASE, AMYLASE in the last 8760 hours. No results for input(s): AMMONIA in the last 8760 hours. CBC:  Recent Labs  10/31/14 1809 11/01/14 0400 11/03/14 0242 11/04/14 0940  WBC 5.9 5.6 8.1 7.4  NEUTROABS 3.7 3.8  --   --   HGB 10.3* 10.2* 10.7* 10.6*  HCT 31.7* 30.6* 31.9* 31.2*  MCV 95.5 95.6 92.2 92.6  PLT 156 167 171 185   TSH:  Recent Labs  11/03/14 0242  TSH 3.048   A1C: No results found for: HGBA1C Lipid Panel: No results for input(s): CHOL, HDL, LDLCALC, TRIG, CHOLHDL, LDLDIRECT in the last 8760 hours.   Assessment/Plan  1. Hypothyroidism due to acquired atrophy of thyroid -conts synthroid 75 mcgs  2. Sacral insufficiency fracture, sequela -noted from hospitalization, WBAT, no surgical intervention, no pain  3. Hyperlipidemia conts on zocor  4. Aortic stenosis -without symptoms, conts on plavix  5. Allergic rhinitis, unspecified allergic rhinitis type -conts on allergra,  flonase and singulair   6. C. Diff Resolved   pt is stable for discharge-will need PT/OT per home health; DME needed BSC and shower bench. Rx written.  will need to follow up with PCP within 2 weeks.

## 2014-12-17 DIAGNOSIS — M4806 Spinal stenosis, lumbar region: Secondary | ICD-10-CM | POA: Diagnosis not present

## 2014-12-17 DIAGNOSIS — M4848XD Fatigue fracture of vertebra, sacral and sacrococcygeal region, subsequent encounter for fracture with routine healing: Secondary | ICD-10-CM | POA: Diagnosis not present

## 2014-12-17 DIAGNOSIS — M62151 Other rupture of muscle (nontraumatic), right thigh: Secondary | ICD-10-CM | POA: Diagnosis not present

## 2014-12-17 DIAGNOSIS — M1612 Unilateral primary osteoarthritis, left hip: Secondary | ICD-10-CM | POA: Diagnosis not present

## 2014-12-17 DIAGNOSIS — M62152 Other rupture of muscle (nontraumatic), left thigh: Secondary | ICD-10-CM | POA: Diagnosis not present

## 2014-12-17 DIAGNOSIS — M6281 Muscle weakness (generalized): Secondary | ICD-10-CM | POA: Diagnosis not present

## 2014-12-19 DIAGNOSIS — I129 Hypertensive chronic kidney disease with stage 1 through stage 4 chronic kidney disease, or unspecified chronic kidney disease: Secondary | ICD-10-CM | POA: Diagnosis not present

## 2014-12-19 DIAGNOSIS — N183 Chronic kidney disease, stage 3 (moderate): Secondary | ICD-10-CM | POA: Diagnosis not present

## 2014-12-19 DIAGNOSIS — Z7189 Other specified counseling: Secondary | ICD-10-CM | POA: Diagnosis not present

## 2014-12-19 DIAGNOSIS — I350 Nonrheumatic aortic (valve) stenosis: Secondary | ICD-10-CM | POA: Diagnosis not present

## 2014-12-19 DIAGNOSIS — F325 Major depressive disorder, single episode, in full remission: Secondary | ICD-10-CM | POA: Diagnosis not present

## 2014-12-23 DIAGNOSIS — M62152 Other rupture of muscle (nontraumatic), left thigh: Secondary | ICD-10-CM | POA: Diagnosis not present

## 2014-12-23 DIAGNOSIS — M1612 Unilateral primary osteoarthritis, left hip: Secondary | ICD-10-CM | POA: Diagnosis not present

## 2014-12-23 DIAGNOSIS — M4806 Spinal stenosis, lumbar region: Secondary | ICD-10-CM | POA: Diagnosis not present

## 2014-12-23 DIAGNOSIS — M62151 Other rupture of muscle (nontraumatic), right thigh: Secondary | ICD-10-CM | POA: Diagnosis not present

## 2014-12-23 DIAGNOSIS — M4848XD Fatigue fracture of vertebra, sacral and sacrococcygeal region, subsequent encounter for fracture with routine healing: Secondary | ICD-10-CM | POA: Diagnosis not present

## 2014-12-23 DIAGNOSIS — M6281 Muscle weakness (generalized): Secondary | ICD-10-CM | POA: Diagnosis not present

## 2014-12-24 DIAGNOSIS — M4848XD Fatigue fracture of vertebra, sacral and sacrococcygeal region, subsequent encounter for fracture with routine healing: Secondary | ICD-10-CM | POA: Diagnosis not present

## 2014-12-24 DIAGNOSIS — M6281 Muscle weakness (generalized): Secondary | ICD-10-CM | POA: Diagnosis not present

## 2014-12-24 DIAGNOSIS — M62151 Other rupture of muscle (nontraumatic), right thigh: Secondary | ICD-10-CM | POA: Diagnosis not present

## 2014-12-24 DIAGNOSIS — M62152 Other rupture of muscle (nontraumatic), left thigh: Secondary | ICD-10-CM | POA: Diagnosis not present

## 2014-12-24 DIAGNOSIS — M4806 Spinal stenosis, lumbar region: Secondary | ICD-10-CM | POA: Diagnosis not present

## 2014-12-24 DIAGNOSIS — M1612 Unilateral primary osteoarthritis, left hip: Secondary | ICD-10-CM | POA: Diagnosis not present

## 2014-12-25 DIAGNOSIS — M4848XD Fatigue fracture of vertebra, sacral and sacrococcygeal region, subsequent encounter for fracture with routine healing: Secondary | ICD-10-CM | POA: Diagnosis not present

## 2014-12-25 DIAGNOSIS — M1612 Unilateral primary osteoarthritis, left hip: Secondary | ICD-10-CM | POA: Diagnosis not present

## 2014-12-25 DIAGNOSIS — M6281 Muscle weakness (generalized): Secondary | ICD-10-CM | POA: Diagnosis not present

## 2014-12-25 DIAGNOSIS — M4806 Spinal stenosis, lumbar region: Secondary | ICD-10-CM | POA: Diagnosis not present

## 2014-12-25 DIAGNOSIS — M62151 Other rupture of muscle (nontraumatic), right thigh: Secondary | ICD-10-CM | POA: Diagnosis not present

## 2014-12-25 DIAGNOSIS — M62152 Other rupture of muscle (nontraumatic), left thigh: Secondary | ICD-10-CM | POA: Diagnosis not present

## 2014-12-27 DIAGNOSIS — M4806 Spinal stenosis, lumbar region: Secondary | ICD-10-CM | POA: Diagnosis not present

## 2014-12-27 DIAGNOSIS — M1612 Unilateral primary osteoarthritis, left hip: Secondary | ICD-10-CM | POA: Diagnosis not present

## 2014-12-27 DIAGNOSIS — M62152 Other rupture of muscle (nontraumatic), left thigh: Secondary | ICD-10-CM | POA: Diagnosis not present

## 2014-12-27 DIAGNOSIS — M4848XD Fatigue fracture of vertebra, sacral and sacrococcygeal region, subsequent encounter for fracture with routine healing: Secondary | ICD-10-CM | POA: Diagnosis not present

## 2014-12-27 DIAGNOSIS — M6281 Muscle weakness (generalized): Secondary | ICD-10-CM | POA: Diagnosis not present

## 2014-12-27 DIAGNOSIS — M62151 Other rupture of muscle (nontraumatic), right thigh: Secondary | ICD-10-CM | POA: Diagnosis not present

## 2014-12-30 DIAGNOSIS — M1612 Unilateral primary osteoarthritis, left hip: Secondary | ICD-10-CM | POA: Diagnosis not present

## 2014-12-30 DIAGNOSIS — M4806 Spinal stenosis, lumbar region: Secondary | ICD-10-CM | POA: Diagnosis not present

## 2014-12-30 DIAGNOSIS — M6281 Muscle weakness (generalized): Secondary | ICD-10-CM | POA: Diagnosis not present

## 2014-12-30 DIAGNOSIS — M62151 Other rupture of muscle (nontraumatic), right thigh: Secondary | ICD-10-CM | POA: Diagnosis not present

## 2014-12-30 DIAGNOSIS — M4848XD Fatigue fracture of vertebra, sacral and sacrococcygeal region, subsequent encounter for fracture with routine healing: Secondary | ICD-10-CM | POA: Diagnosis not present

## 2014-12-30 DIAGNOSIS — M62152 Other rupture of muscle (nontraumatic), left thigh: Secondary | ICD-10-CM | POA: Diagnosis not present

## 2014-12-31 DIAGNOSIS — M6281 Muscle weakness (generalized): Secondary | ICD-10-CM | POA: Diagnosis not present

## 2014-12-31 DIAGNOSIS — M1612 Unilateral primary osteoarthritis, left hip: Secondary | ICD-10-CM | POA: Diagnosis not present

## 2014-12-31 DIAGNOSIS — M62151 Other rupture of muscle (nontraumatic), right thigh: Secondary | ICD-10-CM | POA: Diagnosis not present

## 2014-12-31 DIAGNOSIS — M4806 Spinal stenosis, lumbar region: Secondary | ICD-10-CM | POA: Diagnosis not present

## 2014-12-31 DIAGNOSIS — M62152 Other rupture of muscle (nontraumatic), left thigh: Secondary | ICD-10-CM | POA: Diagnosis not present

## 2014-12-31 DIAGNOSIS — M4848XD Fatigue fracture of vertebra, sacral and sacrococcygeal region, subsequent encounter for fracture with routine healing: Secondary | ICD-10-CM | POA: Diagnosis not present

## 2015-01-01 DIAGNOSIS — M62152 Other rupture of muscle (nontraumatic), left thigh: Secondary | ICD-10-CM | POA: Diagnosis not present

## 2015-01-01 DIAGNOSIS — M1612 Unilateral primary osteoarthritis, left hip: Secondary | ICD-10-CM | POA: Diagnosis not present

## 2015-01-01 DIAGNOSIS — M4848XD Fatigue fracture of vertebra, sacral and sacrococcygeal region, subsequent encounter for fracture with routine healing: Secondary | ICD-10-CM | POA: Diagnosis not present

## 2015-01-01 DIAGNOSIS — M62151 Other rupture of muscle (nontraumatic), right thigh: Secondary | ICD-10-CM | POA: Diagnosis not present

## 2015-01-01 DIAGNOSIS — M6281 Muscle weakness (generalized): Secondary | ICD-10-CM | POA: Diagnosis not present

## 2015-01-01 DIAGNOSIS — M4806 Spinal stenosis, lumbar region: Secondary | ICD-10-CM | POA: Diagnosis not present

## 2015-01-02 DIAGNOSIS — M62151 Other rupture of muscle (nontraumatic), right thigh: Secondary | ICD-10-CM | POA: Diagnosis not present

## 2015-01-02 DIAGNOSIS — M6281 Muscle weakness (generalized): Secondary | ICD-10-CM | POA: Diagnosis not present

## 2015-01-02 DIAGNOSIS — M1612 Unilateral primary osteoarthritis, left hip: Secondary | ICD-10-CM | POA: Diagnosis not present

## 2015-01-02 DIAGNOSIS — M4806 Spinal stenosis, lumbar region: Secondary | ICD-10-CM | POA: Diagnosis not present

## 2015-01-02 DIAGNOSIS — M4848XD Fatigue fracture of vertebra, sacral and sacrococcygeal region, subsequent encounter for fracture with routine healing: Secondary | ICD-10-CM | POA: Diagnosis not present

## 2015-01-02 DIAGNOSIS — M62152 Other rupture of muscle (nontraumatic), left thigh: Secondary | ICD-10-CM | POA: Diagnosis not present

## 2015-01-03 DIAGNOSIS — M1612 Unilateral primary osteoarthritis, left hip: Secondary | ICD-10-CM | POA: Diagnosis not present

## 2015-01-03 DIAGNOSIS — M4848XD Fatigue fracture of vertebra, sacral and sacrococcygeal region, subsequent encounter for fracture with routine healing: Secondary | ICD-10-CM | POA: Diagnosis not present

## 2015-01-03 DIAGNOSIS — M6281 Muscle weakness (generalized): Secondary | ICD-10-CM | POA: Diagnosis not present

## 2015-01-03 DIAGNOSIS — M4806 Spinal stenosis, lumbar region: Secondary | ICD-10-CM | POA: Diagnosis not present

## 2015-01-03 DIAGNOSIS — M62152 Other rupture of muscle (nontraumatic), left thigh: Secondary | ICD-10-CM | POA: Diagnosis not present

## 2015-01-03 DIAGNOSIS — M62151 Other rupture of muscle (nontraumatic), right thigh: Secondary | ICD-10-CM | POA: Diagnosis not present

## 2015-01-06 DIAGNOSIS — M6281 Muscle weakness (generalized): Secondary | ICD-10-CM | POA: Diagnosis not present

## 2015-01-06 DIAGNOSIS — M62151 Other rupture of muscle (nontraumatic), right thigh: Secondary | ICD-10-CM | POA: Diagnosis not present

## 2015-01-06 DIAGNOSIS — M4806 Spinal stenosis, lumbar region: Secondary | ICD-10-CM | POA: Diagnosis not present

## 2015-01-06 DIAGNOSIS — M4848XD Fatigue fracture of vertebra, sacral and sacrococcygeal region, subsequent encounter for fracture with routine healing: Secondary | ICD-10-CM | POA: Diagnosis not present

## 2015-01-06 DIAGNOSIS — M1612 Unilateral primary osteoarthritis, left hip: Secondary | ICD-10-CM | POA: Diagnosis not present

## 2015-01-06 DIAGNOSIS — M62152 Other rupture of muscle (nontraumatic), left thigh: Secondary | ICD-10-CM | POA: Diagnosis not present

## 2015-01-08 DIAGNOSIS — M1612 Unilateral primary osteoarthritis, left hip: Secondary | ICD-10-CM | POA: Diagnosis not present

## 2015-01-08 DIAGNOSIS — M4806 Spinal stenosis, lumbar region: Secondary | ICD-10-CM | POA: Diagnosis not present

## 2015-01-08 DIAGNOSIS — M62152 Other rupture of muscle (nontraumatic), left thigh: Secondary | ICD-10-CM | POA: Diagnosis not present

## 2015-01-08 DIAGNOSIS — M6281 Muscle weakness (generalized): Secondary | ICD-10-CM | POA: Diagnosis not present

## 2015-01-08 DIAGNOSIS — M62151 Other rupture of muscle (nontraumatic), right thigh: Secondary | ICD-10-CM | POA: Diagnosis not present

## 2015-01-08 DIAGNOSIS — M4848XD Fatigue fracture of vertebra, sacral and sacrococcygeal region, subsequent encounter for fracture with routine healing: Secondary | ICD-10-CM | POA: Diagnosis not present

## 2015-01-10 DIAGNOSIS — M62151 Other rupture of muscle (nontraumatic), right thigh: Secondary | ICD-10-CM | POA: Diagnosis not present

## 2015-01-10 DIAGNOSIS — M4806 Spinal stenosis, lumbar region: Secondary | ICD-10-CM | POA: Diagnosis not present

## 2015-01-10 DIAGNOSIS — M6281 Muscle weakness (generalized): Secondary | ICD-10-CM | POA: Diagnosis not present

## 2015-01-10 DIAGNOSIS — M1612 Unilateral primary osteoarthritis, left hip: Secondary | ICD-10-CM | POA: Diagnosis not present

## 2015-01-10 DIAGNOSIS — M62152 Other rupture of muscle (nontraumatic), left thigh: Secondary | ICD-10-CM | POA: Diagnosis not present

## 2015-01-10 DIAGNOSIS — M4848XD Fatigue fracture of vertebra, sacral and sacrococcygeal region, subsequent encounter for fracture with routine healing: Secondary | ICD-10-CM | POA: Diagnosis not present

## 2015-01-13 DIAGNOSIS — M1612 Unilateral primary osteoarthritis, left hip: Secondary | ICD-10-CM | POA: Diagnosis not present

## 2015-01-13 DIAGNOSIS — M4806 Spinal stenosis, lumbar region: Secondary | ICD-10-CM | POA: Diagnosis not present

## 2015-01-13 DIAGNOSIS — M6281 Muscle weakness (generalized): Secondary | ICD-10-CM | POA: Diagnosis not present

## 2015-01-13 DIAGNOSIS — M62151 Other rupture of muscle (nontraumatic), right thigh: Secondary | ICD-10-CM | POA: Diagnosis not present

## 2015-01-13 DIAGNOSIS — M4848XD Fatigue fracture of vertebra, sacral and sacrococcygeal region, subsequent encounter for fracture with routine healing: Secondary | ICD-10-CM | POA: Diagnosis not present

## 2015-01-13 DIAGNOSIS — M62152 Other rupture of muscle (nontraumatic), left thigh: Secondary | ICD-10-CM | POA: Diagnosis not present

## 2015-01-15 DIAGNOSIS — M62151 Other rupture of muscle (nontraumatic), right thigh: Secondary | ICD-10-CM | POA: Diagnosis not present

## 2015-01-15 DIAGNOSIS — M1612 Unilateral primary osteoarthritis, left hip: Secondary | ICD-10-CM | POA: Diagnosis not present

## 2015-01-15 DIAGNOSIS — M62152 Other rupture of muscle (nontraumatic), left thigh: Secondary | ICD-10-CM | POA: Diagnosis not present

## 2015-01-15 DIAGNOSIS — M4806 Spinal stenosis, lumbar region: Secondary | ICD-10-CM | POA: Diagnosis not present

## 2015-01-15 DIAGNOSIS — M4848XD Fatigue fracture of vertebra, sacral and sacrococcygeal region, subsequent encounter for fracture with routine healing: Secondary | ICD-10-CM | POA: Diagnosis not present

## 2015-01-15 DIAGNOSIS — M6281 Muscle weakness (generalized): Secondary | ICD-10-CM | POA: Diagnosis not present

## 2015-01-17 DIAGNOSIS — M6281 Muscle weakness (generalized): Secondary | ICD-10-CM | POA: Diagnosis not present

## 2015-01-17 DIAGNOSIS — M4806 Spinal stenosis, lumbar region: Secondary | ICD-10-CM | POA: Diagnosis not present

## 2015-01-17 DIAGNOSIS — M1612 Unilateral primary osteoarthritis, left hip: Secondary | ICD-10-CM | POA: Diagnosis not present

## 2015-01-17 DIAGNOSIS — M4848XD Fatigue fracture of vertebra, sacral and sacrococcygeal region, subsequent encounter for fracture with routine healing: Secondary | ICD-10-CM | POA: Diagnosis not present

## 2015-01-17 DIAGNOSIS — M62151 Other rupture of muscle (nontraumatic), right thigh: Secondary | ICD-10-CM | POA: Diagnosis not present

## 2015-01-17 DIAGNOSIS — M62152 Other rupture of muscle (nontraumatic), left thigh: Secondary | ICD-10-CM | POA: Diagnosis not present

## 2015-04-16 DIAGNOSIS — E780 Pure hypercholesterolemia: Secondary | ICD-10-CM | POA: Diagnosis not present

## 2015-04-16 DIAGNOSIS — N183 Chronic kidney disease, stage 3 (moderate): Secondary | ICD-10-CM | POA: Diagnosis not present

## 2015-04-16 DIAGNOSIS — I350 Nonrheumatic aortic (valve) stenosis: Secondary | ICD-10-CM | POA: Diagnosis not present

## 2015-04-16 DIAGNOSIS — F325 Major depressive disorder, single episode, in full remission: Secondary | ICD-10-CM | POA: Diagnosis not present

## 2015-04-16 DIAGNOSIS — E039 Hypothyroidism, unspecified: Secondary | ICD-10-CM | POA: Diagnosis not present

## 2015-04-16 DIAGNOSIS — Z79899 Other long term (current) drug therapy: Secondary | ICD-10-CM | POA: Diagnosis not present

## 2015-04-16 DIAGNOSIS — I129 Hypertensive chronic kidney disease with stage 1 through stage 4 chronic kidney disease, or unspecified chronic kidney disease: Secondary | ICD-10-CM | POA: Diagnosis not present

## 2015-06-24 DIAGNOSIS — Z23 Encounter for immunization: Secondary | ICD-10-CM | POA: Diagnosis not present

## 2015-12-31 DIAGNOSIS — F325 Major depressive disorder, single episode, in full remission: Secondary | ICD-10-CM | POA: Diagnosis not present

## 2015-12-31 DIAGNOSIS — E7800 Pure hypercholesterolemia, unspecified: Secondary | ICD-10-CM | POA: Diagnosis not present

## 2015-12-31 DIAGNOSIS — N183 Chronic kidney disease, stage 3 (moderate): Secondary | ICD-10-CM | POA: Diagnosis not present

## 2015-12-31 DIAGNOSIS — I350 Nonrheumatic aortic (valve) stenosis: Secondary | ICD-10-CM | POA: Diagnosis not present

## 2015-12-31 DIAGNOSIS — Z0000 Encounter for general adult medical examination without abnormal findings: Secondary | ICD-10-CM | POA: Diagnosis not present

## 2015-12-31 DIAGNOSIS — I129 Hypertensive chronic kidney disease with stage 1 through stage 4 chronic kidney disease, or unspecified chronic kidney disease: Secondary | ICD-10-CM | POA: Diagnosis not present

## 2016-02-23 IMAGING — CR DG HIP (WITH OR WITHOUT PELVIS) 2-3V*L*
3 series · 3 of 3 positions shown · non-contrast
Comparison: None.

CLINICAL DATA: Fall 2 weeks ago.  Low back and LEFT hip pain.

EXAM:
LEFT HIP (WITH PELVIS) 2-3 VIEWS

[t pelvis ap]
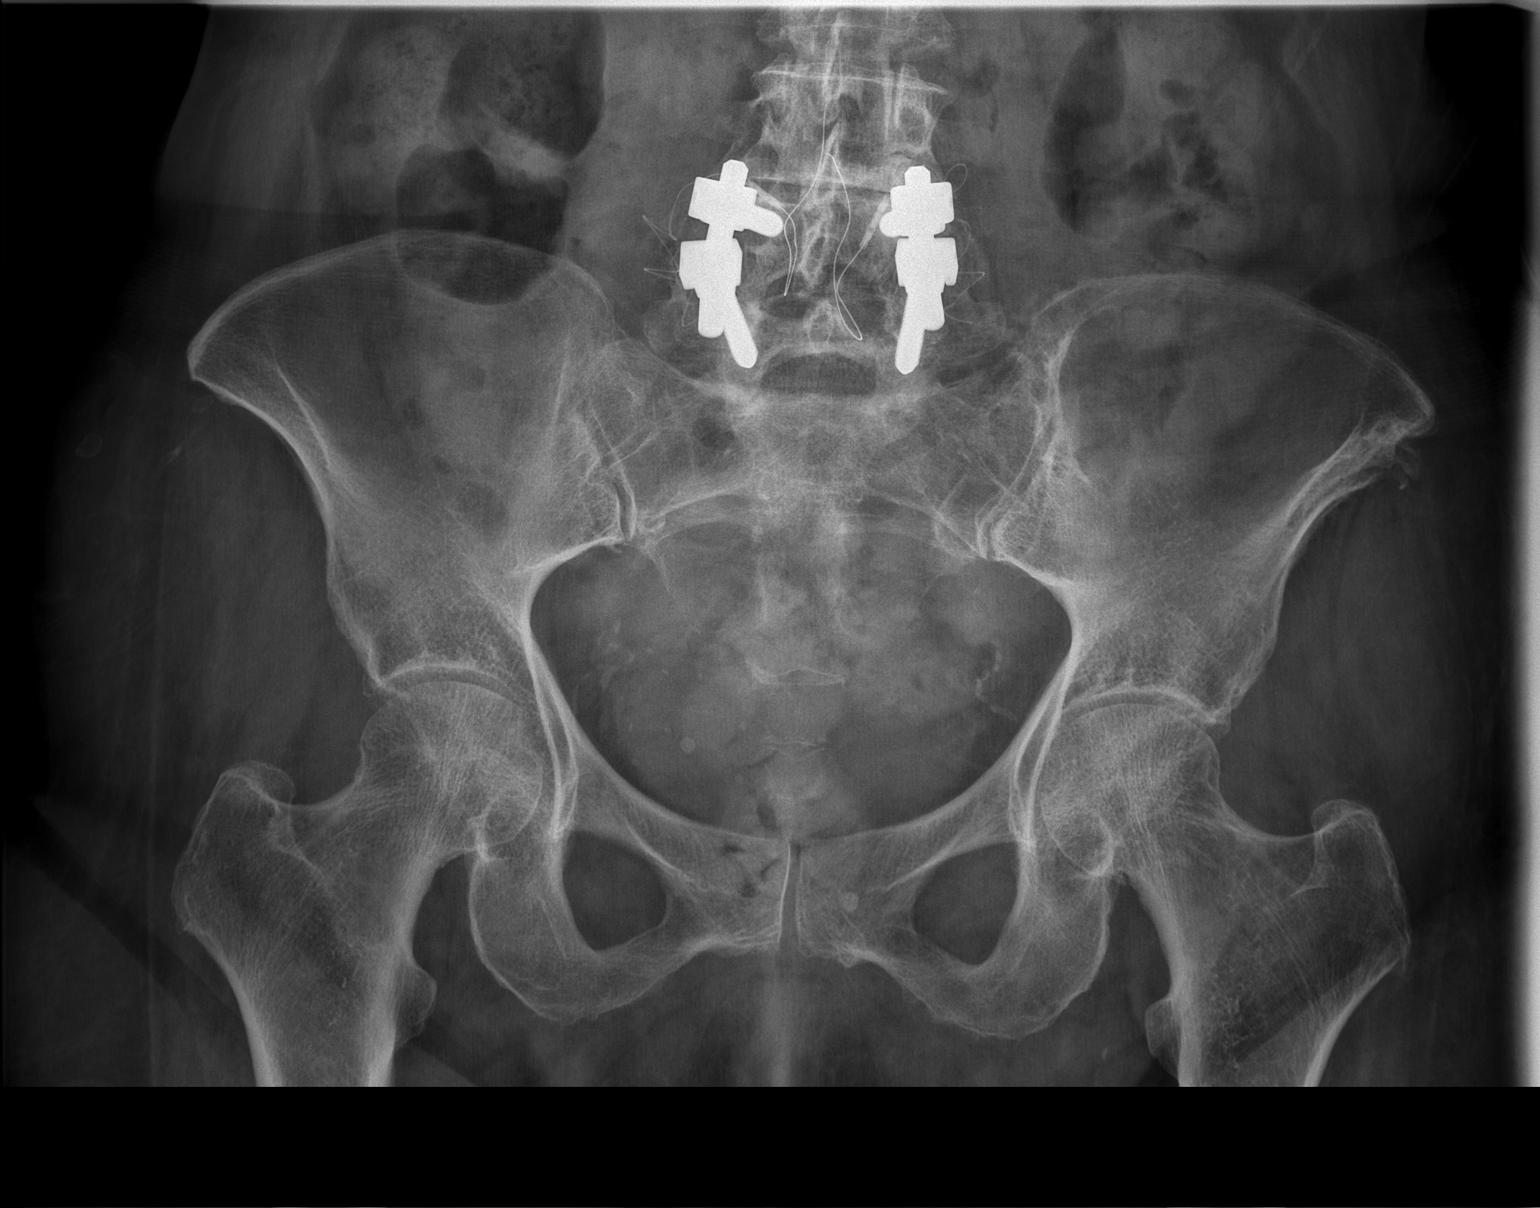

[t hip ap left]
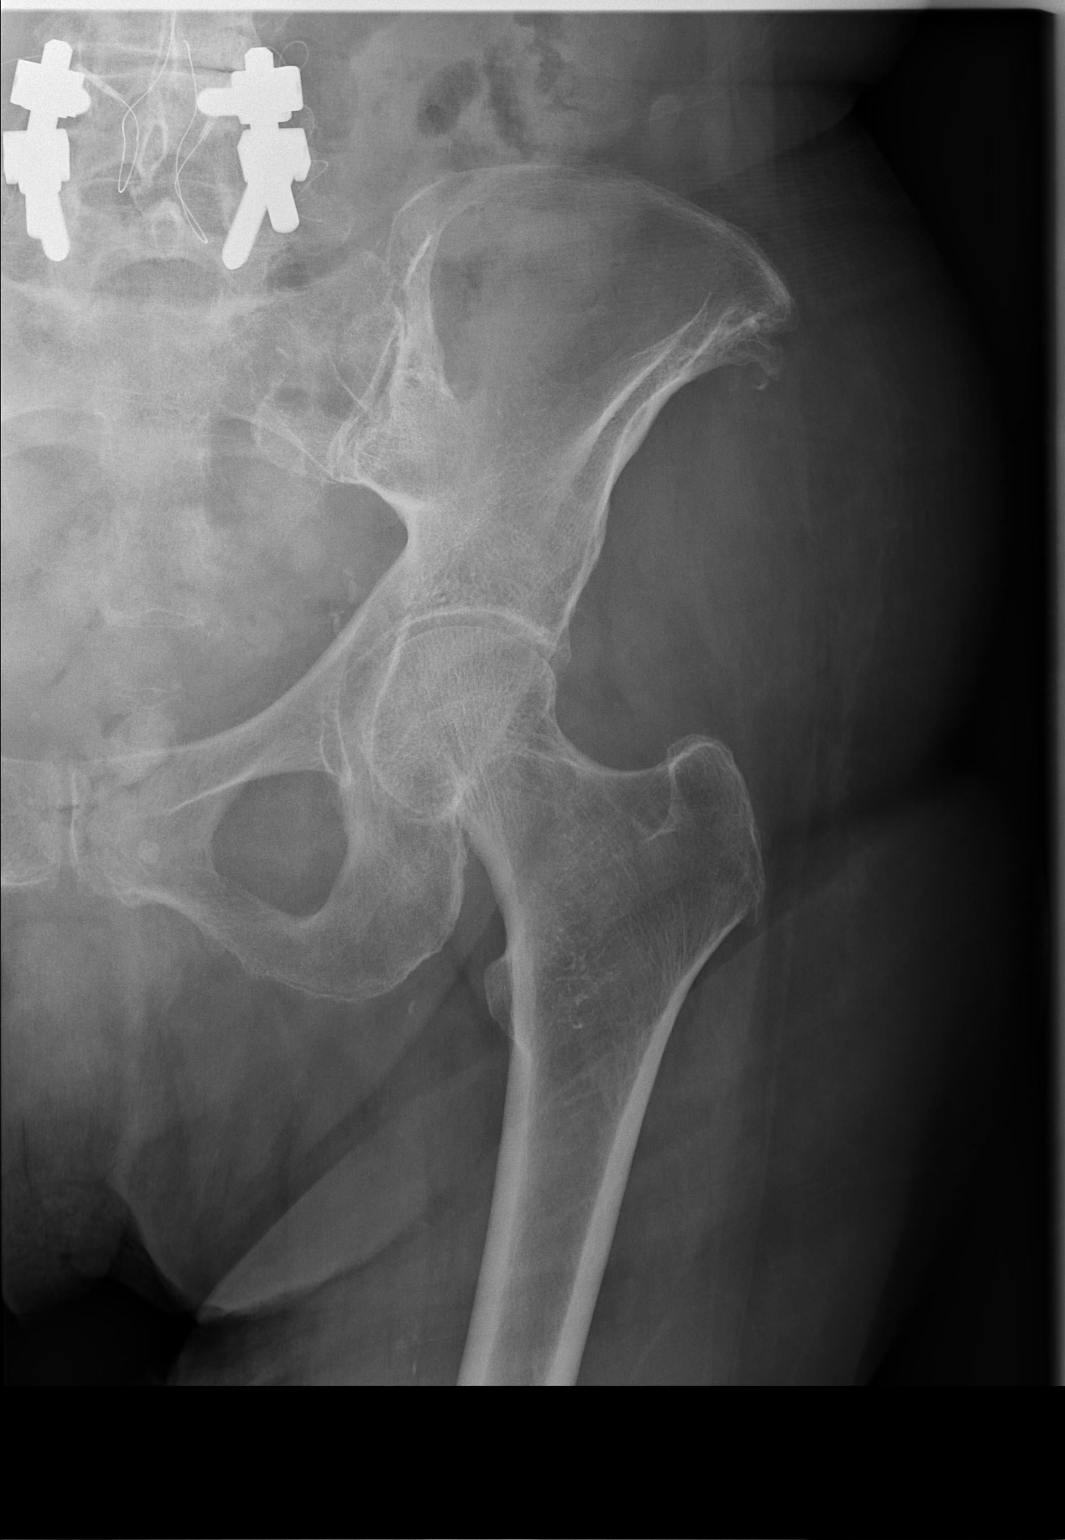

[t hip frog leg left]
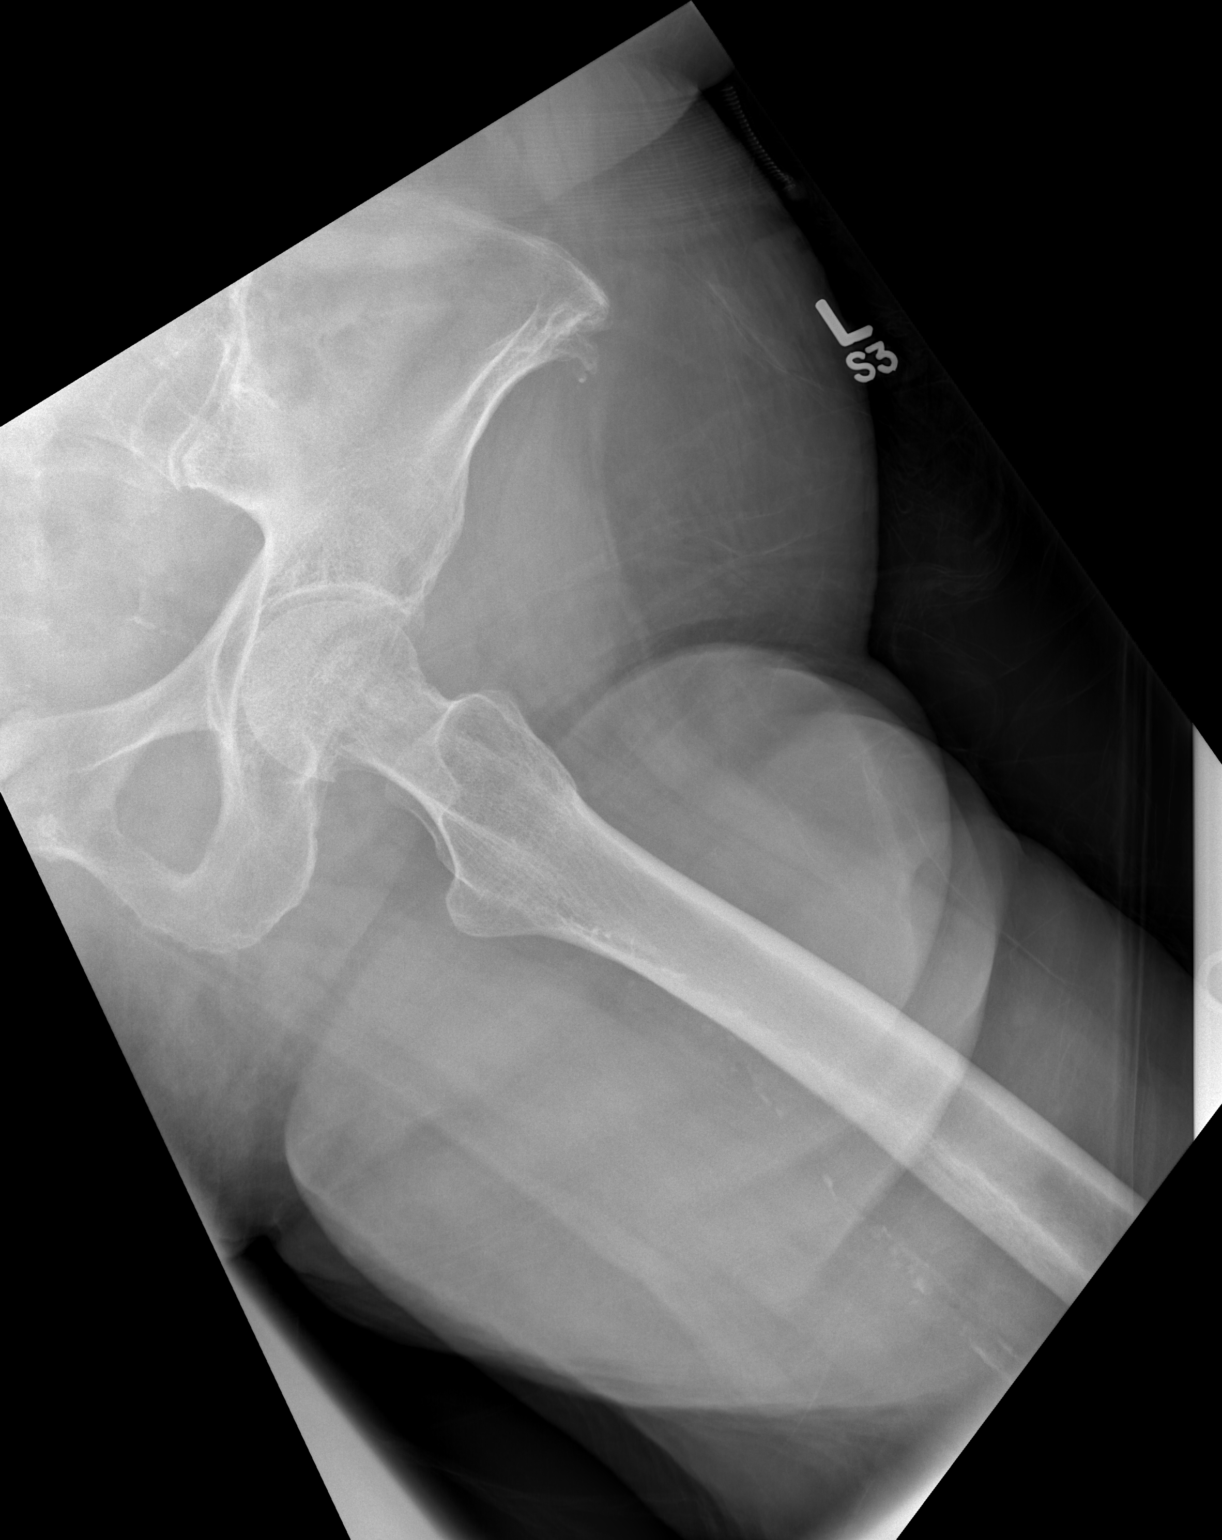

[3 of 3 positions shown; findings below may reference images not displayed]

FINDINGS: Pelvic rings appear intact. Hip joint spaces are preserved.
Atherosclerosis. On the lateral view, LEFT hip shows tiny marginal
osteophytes compatible with mild osteoarthritis.
IMPRESSION: Mild LEFT hip osteoarthritis.  No acute osseous abnormality.

## 2016-02-23 IMAGING — CR DG LUMBAR SPINE COMPLETE 4+V
5 series · 5 of 5 positions shown · non-contrast
Comparison: None.

CLINICAL DATA: Fall 2 weeks ago.  Low back and LEFT hip pain.

EXAM:
LUMBAR SPINE - COMPLETE 4+ VIEW

[t lumbar spine ap]
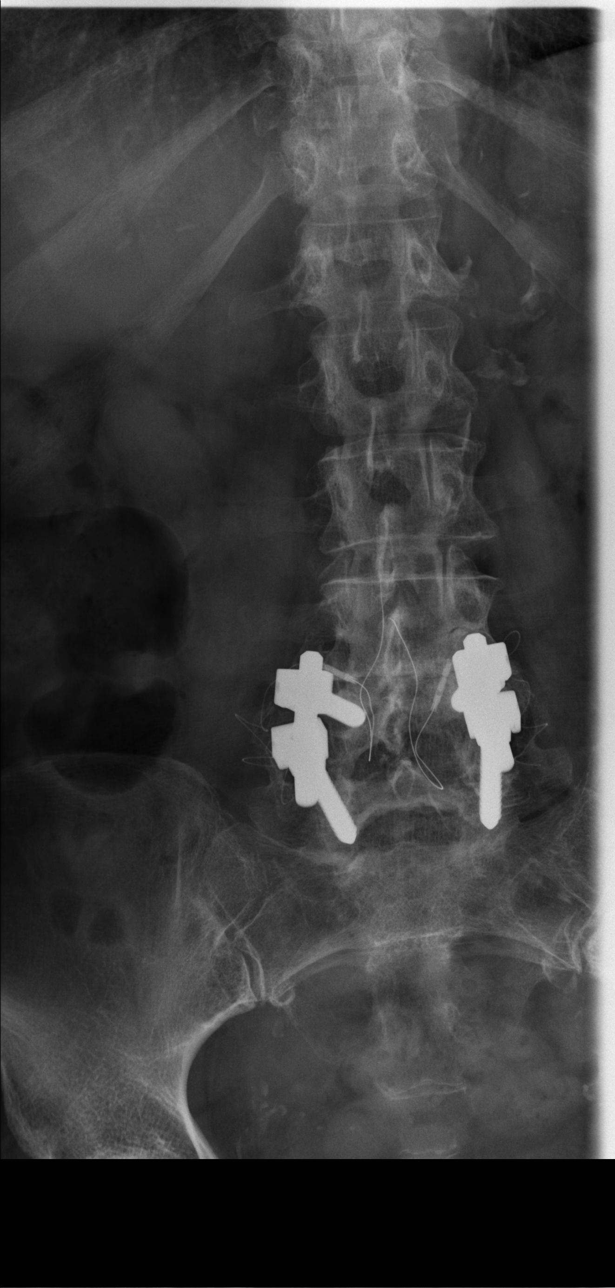

[t lumbar spine obl (1 of 2)]
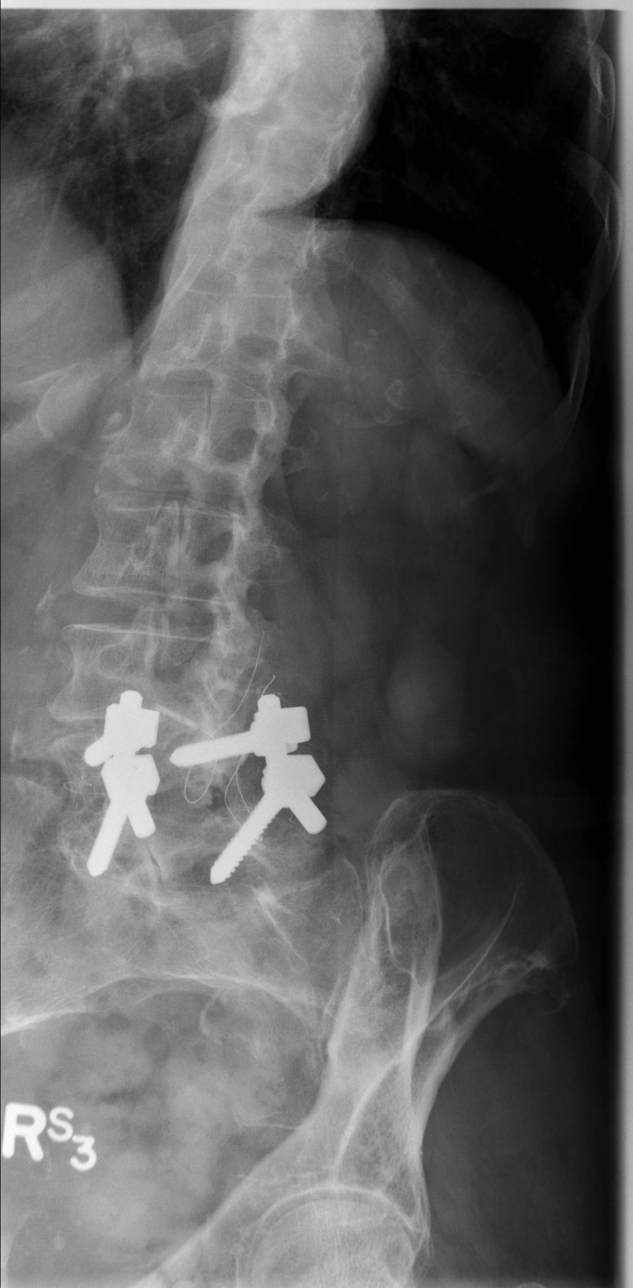

[t lumbar spine obl (2 of 2)]
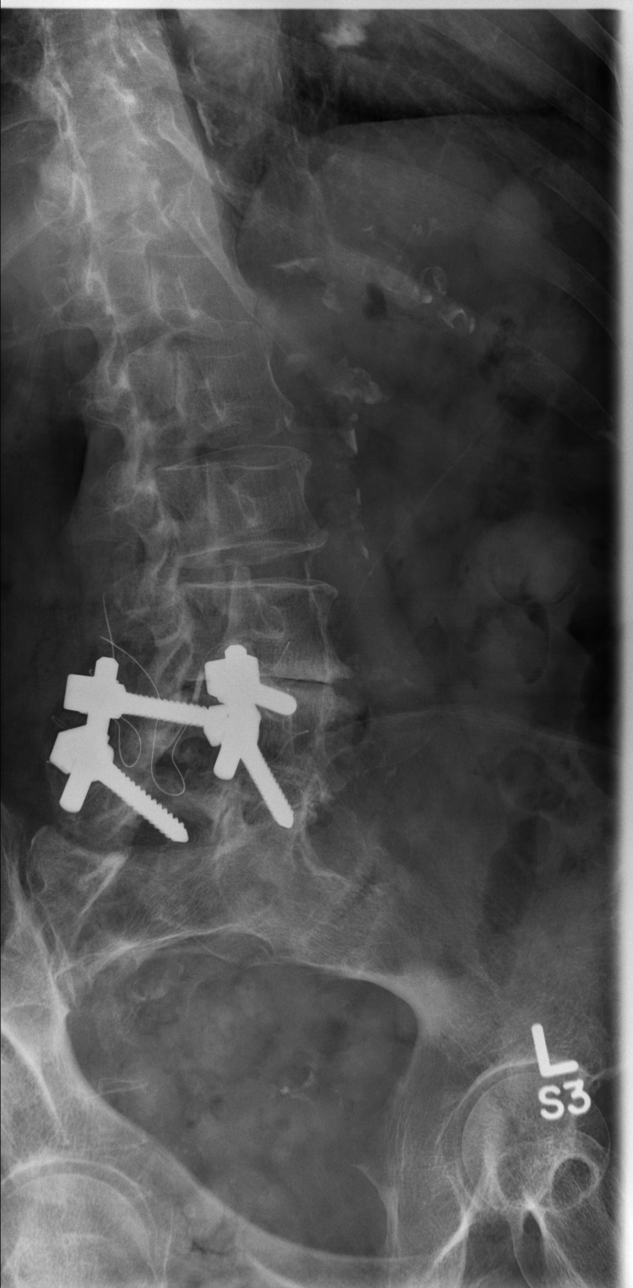

[t lumbar spine lat]
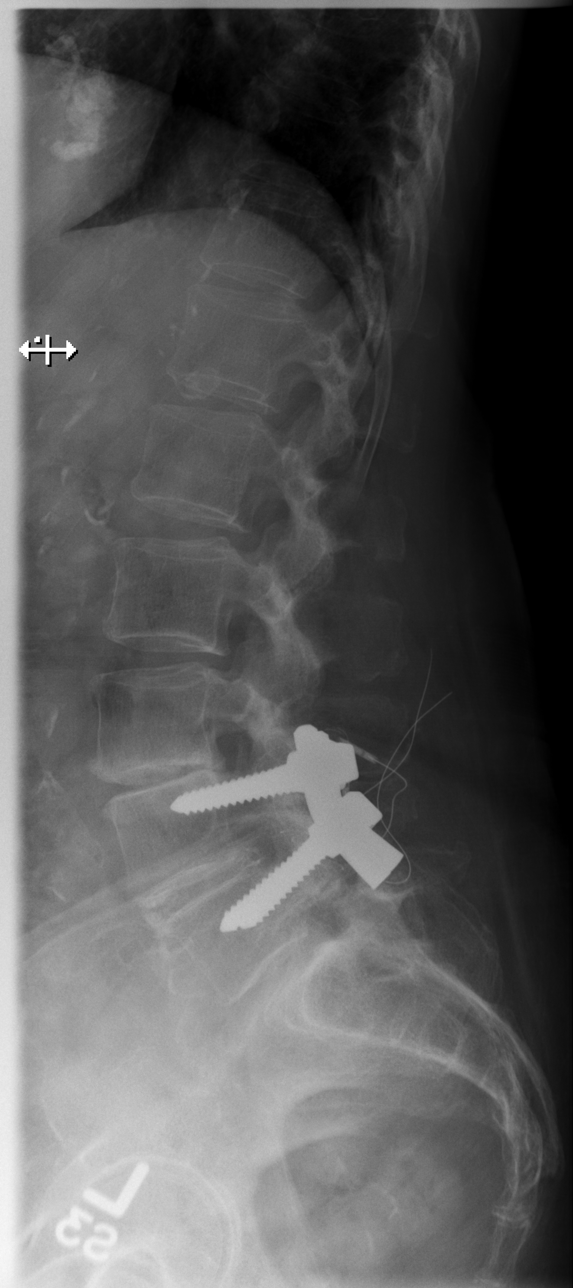

[t lumbar l-5 s-1 spot]
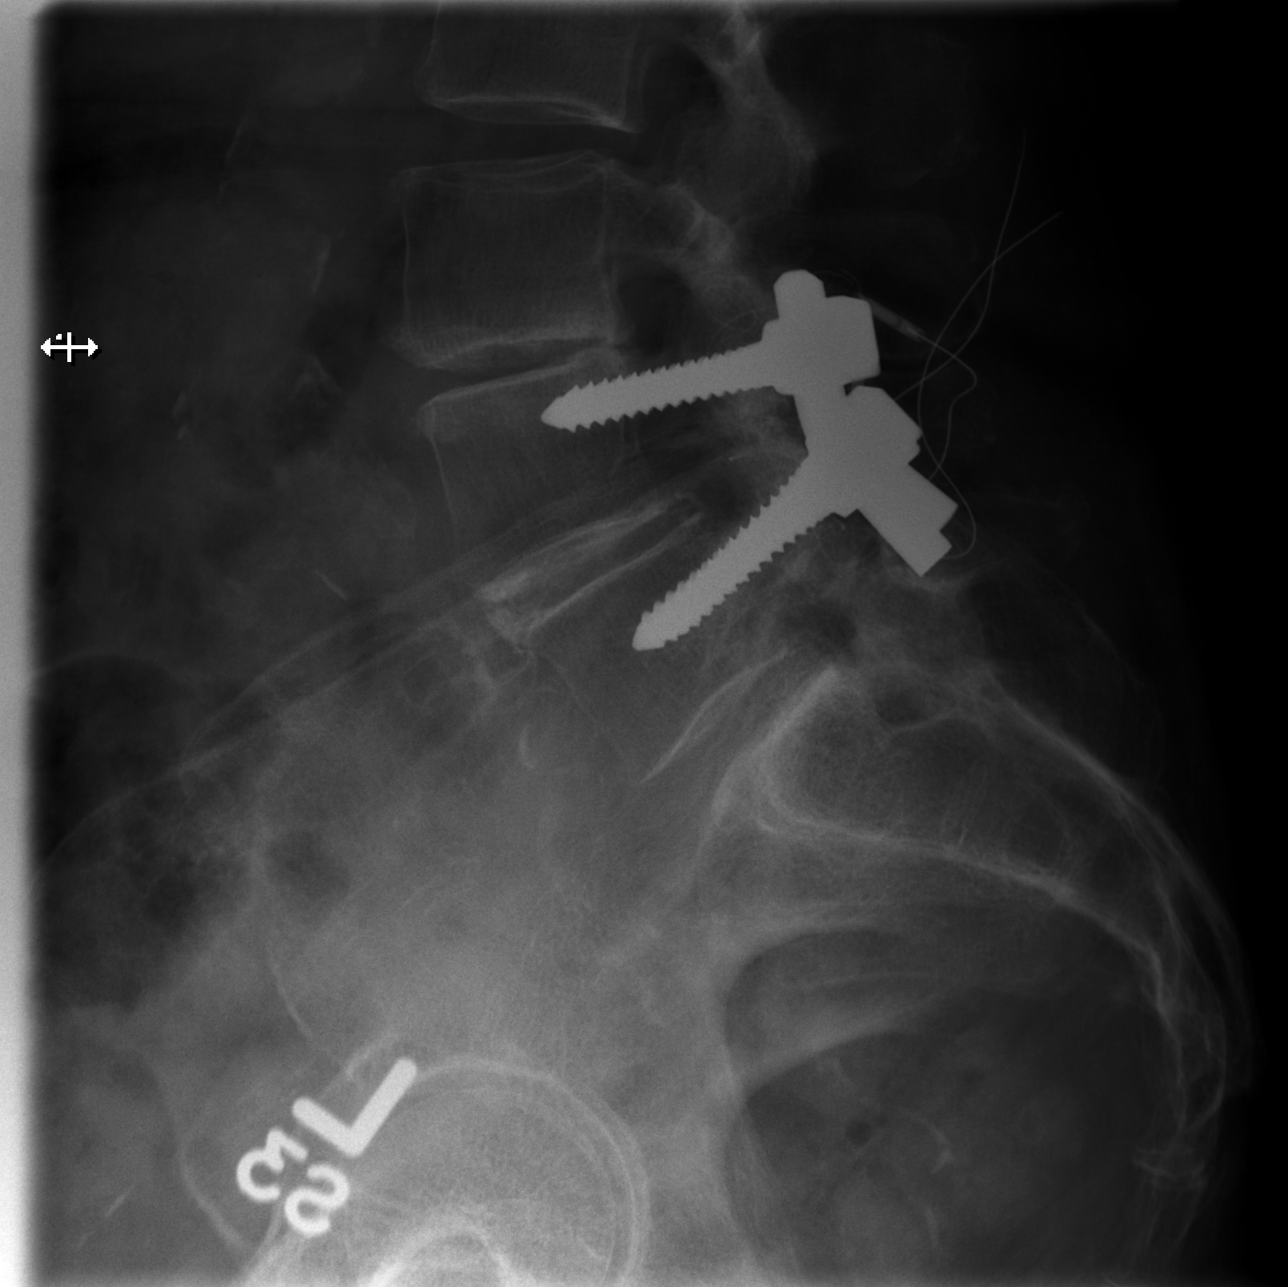

[5 of 5 positions shown; findings below may reference images not displayed]

FINDINGS: L4-L5 posterior lumbar interbody fusion. Radiopaque leads are
present over the fusion, likely for prior bones stimulator. No power
pack is present. There is adjacent segment disease at L3-L4 with
loss of the disc space and endplate sclerosis. Vertebral body height
is preserved.
IMPRESSION: No acute osseous abnormality. L4-L5 fusion appears solid with severe
L3-L4 adjacent segment degenerative disc disease.

## 2016-03-14 ENCOUNTER — Emergency Department (HOSPITAL_COMMUNITY)
Admission: EM | Admit: 2016-03-14 | Discharge: 2016-03-14 | Disposition: A | Discharge status: Home / Self Care | Payer: Medicare Other | Attending: Emergency Medicine | Admitting: Emergency Medicine

## 2016-03-14 ENCOUNTER — Emergency Department (HOSPITAL_COMMUNITY): Admission: EM | Admit: 2016-03-14 | Discharge: 2016-03-14 | Discharge status: Absent without leave | Payer: Self-pay

## 2016-03-14 ENCOUNTER — Emergency Department (HOSPITAL_COMMUNITY): Payer: Medicare Other

## 2016-03-14 ENCOUNTER — Encounter (HOSPITAL_COMMUNITY): Payer: Self-pay | Admitting: *Deleted

## 2016-03-14 DIAGNOSIS — Z79899 Other long term (current) drug therapy: Secondary | ICD-10-CM | POA: Insufficient documentation

## 2016-03-14 DIAGNOSIS — M25572 Pain in left ankle and joints of left foot: Secondary | ICD-10-CM | POA: Diagnosis not present

## 2016-03-14 DIAGNOSIS — S99922A Unspecified injury of left foot, initial encounter: Secondary | ICD-10-CM | POA: Insufficient documentation

## 2016-03-14 DIAGNOSIS — S79912A Unspecified injury of left hip, initial encounter: Secondary | ICD-10-CM | POA: Diagnosis not present

## 2016-03-14 DIAGNOSIS — I10 Essential (primary) hypertension: Secondary | ICD-10-CM | POA: Diagnosis not present

## 2016-03-14 DIAGNOSIS — Z791 Long term (current) use of non-steroidal anti-inflammatories (NSAID): Secondary | ICD-10-CM | POA: Insufficient documentation

## 2016-03-14 DIAGNOSIS — M810 Age-related osteoporosis without current pathological fracture: Secondary | ICD-10-CM | POA: Insufficient documentation

## 2016-03-14 DIAGNOSIS — M79672 Pain in left foot: Secondary | ICD-10-CM | POA: Diagnosis not present

## 2016-03-14 DIAGNOSIS — Y939 Activity, unspecified: Secondary | ICD-10-CM | POA: Diagnosis not present

## 2016-03-14 DIAGNOSIS — W06XXXA Fall from bed, initial encounter: Secondary | ICD-10-CM | POA: Diagnosis not present

## 2016-03-14 DIAGNOSIS — F0390 Unspecified dementia without behavioral disturbance: Secondary | ICD-10-CM | POA: Diagnosis not present

## 2016-03-14 DIAGNOSIS — E785 Hyperlipidemia, unspecified: Secondary | ICD-10-CM | POA: Insufficient documentation

## 2016-03-14 DIAGNOSIS — Y92009 Unspecified place in unspecified non-institutional (private) residence as the place of occurrence of the external cause: Secondary | ICD-10-CM | POA: Insufficient documentation

## 2016-03-14 DIAGNOSIS — Y999 Unspecified external cause status: Secondary | ICD-10-CM | POA: Insufficient documentation

## 2016-03-14 DIAGNOSIS — Z7951 Long term (current) use of inhaled steroids: Secondary | ICD-10-CM | POA: Diagnosis not present

## 2016-03-14 DIAGNOSIS — F329 Major depressive disorder, single episode, unspecified: Secondary | ICD-10-CM | POA: Diagnosis not present

## 2016-03-14 DIAGNOSIS — S99912A Unspecified injury of left ankle, initial encounter: Secondary | ICD-10-CM | POA: Diagnosis not present

## 2016-03-14 HISTORY — DX: Urinary tract infection, site not specified: N39.0

## 2016-03-14 HISTORY — DX: Enterocolitis due to Clostridium difficile, not specified as recurrent: A04.72

## 2016-03-14 MED ORDER — HYDROCODONE-ACETAMINOPHEN 5-325 MG PO TABS
1.0000 | ORAL_TABLET | Freq: Once | ORAL | Status: AC
Start: 2016-03-14 — End: 2016-03-14
  Administered 2016-03-14: 1 via ORAL
  Filled 2016-03-14: qty 1

## 2016-03-14 MED ORDER — HYDROCODONE-ACETAMINOPHEN 5-325 MG PO TABS: ORAL_TABLET | ORAL | Status: DC

## 2016-03-14 NOTE — ED Notes (Signed)
Pts son was assisting pt to bed last night, turned a little pt went down to floor, left hip pain and left ankle pain.

## 2016-03-14 NOTE — Discharge Instructions (Signed)
Dr. Jeneen Rinks has ordered an in home nursing and physical therapy evaluation.  You should receive a phone call tomorrow regarding this.  Minimize weightbearing and use of the left leg until pain and swelling have improved.  Vicodin one half tablet as needed for pain.

## 2016-03-14 NOTE — ED Notes (Signed)
Pt's daughter and son-in-law reviewed and discussed all provided discharge material and expressed an understanding of everything provided.  Assisted with dressing patient, transferring patient to wheelchair, and transferring patient into family's vehicle.

## 2016-03-14 NOTE — ED Notes (Signed)
pts son was assisting pt to bed, pt turned a little and went to the floor, has left ankle and hip pain.

## 2016-03-14 NOTE — ED Notes (Signed)
Patient transported to X-ray 

## 2016-03-14 NOTE — ED Provider Notes (Signed)
CSN: [id number]     Arrival date & time 03/14/16  1424 History   First MD Initiated Contact with Patient 03/14/16 1445     Chief Complaint  Patient presents with  . Fall      HPI  Patient presents for evaluation accompanied by her daughter, and son-in-law. She normally lives at home. Lives with her son who is in his 55s and able to help her transfer. Normally she only transfer from bed to chair or chair to toilet and requires assistance for this. Occasionally walk with a walker short distances but with a cyst. Had a fall and was her foot when attempting to get into bed last night. Has some pain in her foot. Has chronic deformity of her left foot since she was an infant.  Past Medical History  Diagnosis Date  . Hypertension   . Thyroid disease   . Osteoporosis   . Depression   . Hyperlipidemia   . UTI (lower urinary tract infection)   . C. difficile colitis    Past Surgical History  Procedure Laterality Date  . Neck surgery    . Foot surgery     Family History  Problem Relation Age of Onset  . Hypertension Son    Social History  Substance Use Topics  . Smoking status: Never Smoker   . Smokeless tobacco: None  . Alcohol Use: No   OB History    No data available     Review of Systems  Unable to perform ROS: Dementia      Allergies  Fosamax; Hctz; Latex; Neosporin; Norvasc; Bacitracin-neomycin-polymyxin; Lanolin; Naproxen; and Polysporin  Home Medications   Prior to Admission medications   Medication Sig Start Date End Date Taking? Authorizing Provider  acetaminophen (TYLENOL) 500 MG tablet Take 500-1,000 mg by mouth every 6 (six) hours as needed for moderate pain.   Yes Historical Provider, MD  acidophilus (RISAQUAD) CAPS capsule Take 1 capsule by mouth daily.   Yes Historical Provider, MD  Calcium Carb-Cholecalciferol (CALCIUM 600 + D) 600-200 MG-UNIT TABS Take by mouth.   Yes Historical Provider, MD  cholecalciferol (VITAMIN D) 1000 units tablet Take 1,000  Units by mouth daily.   Yes Historical Provider, MD  clopidogrel (PLAVIX) 75 MG tablet Take 75 mg by mouth daily.   Yes Historical Provider, MD  fexofenadine (ALLEGRA) 180 MG tablet Take 180 mg by mouth daily.   Yes Historical Provider, MD  fluticasone (FLONASE) 50 MCG/ACT nasal spray Place 1 spray into both nostrils daily as needed for allergies.    Yes Historical Provider, MD  levothyroxine (SYNTHROID, LEVOTHROID) 75 MCG tablet Take 75 mcg by mouth daily before breakfast.   Yes Historical Provider, MD  Melatonin 10 MG CAPS Take 10 mg by mouth at bedtime.   Yes Historical Provider, MD  montelukast (SINGULAIR) 10 MG tablet Take 10 mg by mouth at bedtime.   Yes Historical Provider, MD  Multiple Vitamins-Minerals (MULTIVITAMIN WITH MINERALS) tablet Take 1 tablet by mouth daily.   Yes Historical Provider, MD  Multiple Vitamins-Minerals (PRESERVISION AREDS 2 PO) Take 2 capsules by mouth 2 (two) times daily.   Yes Historical Provider, MD  Olopatadine HCl (PATADAY) 0.2 % SOLN Place 1 drop into both eyes daily.   Yes Historical Provider, MD  sertraline (ZOLOFT) 25 MG tablet Take 25 mg by mouth daily.   Yes Historical Provider, MD  HYDROcodone-acetaminophen (NORCO/VICODIN) 5-325 MG tablet One half tablet by mouth every 6 hours for pain. He had some sedation effects. 03/14/16  Tanna Furry, MD  LORazepam (ATIVAN) 0.5 MG tablet Take 1 tablet (0.5 mg total) by mouth every 8 (eight) hours as needed for anxiety. Patient not taking: Reported on 03/14/2016 11/05/14   Orson Eva, MD   BP 120/65 mmHg  Pulse 70  Resp 17  SpO2 97% Physical Exam  Constitutional: She is oriented to person, place, and time. She appears well-developed and well-nourished. No distress.  HENT:  Head: Normocephalic.  Eyes: Conjunctivae are normal. Pupils are equal, round, and reactive to light. No scleral icterus.  Neck: Normal range of motion. Neck supple. No thyromegaly present.  Cardiovascular: Normal rate and regular rhythm.  Exam  reveals no gallop and no friction rub.   No murmur heard. Pulmonary/Chest: Effort normal and breath sounds normal. No respiratory distress. She has no wheezes. She has no rales.  Abdominal: Soft. Bowel sounds are normal. She exhibits no distension. There is no tenderness. There is no rebound.  Musculoskeletal: Normal range of motion.  No leg length discrepancy. No pain to flex or internally externally rotate the hip. Nontender directly over the greater trochanter. Full range of motion without abnormalities or tenderness in the knee.  Chronic appearing deformity at the ankle consistent with her previous general abnormality. Some tenderness over the dorsum of the foot. Minimal soft tissue swelling.  Neurological: She is alert and oriented to person, place, and time.  Skin: Skin is warm and dry. No rash noted.  Psychiatric: She has a normal mood and affect. Her behavior is normal.    ED Course  Procedures (including critical care time) Labs Review Labs Reviewed - No data to display  Imaging Review Dg Ankle Complete Left  03/14/2016  CLINICAL DATA:  Fall last evening.  Left foot and ankle pain. EXAM: LEFT ANKLE COMPLETE - 3+ VIEW COMPARISON:  None. FINDINGS: Marked osteopenia is present. This is likely secondary to disuse. There is coli shin of the tarsal bones and flattening of the arch. The ankle joint is intact. Marked degenerative changes are present. Atherosclerotic calcifications are present. IMPRESSION: 1. Marked osteopenia without evidence for acute fracture. 2. Advanced degenerative changes of the ankle. 3. Coalition of the tarsal bones and flattening of the plantar arch. Electronically Signed   By: San Morelle M.D.   On: 03/14/2016 15:31   Dg Foot Complete Left  03/14/2016  CLINICAL DATA:  Fall during transfer last evening. Diffuse left lower extremity pain. Foot pain. EXAM: LEFT FOOT - COMPLETE 3+ VIEW COMPARISON:  None. FINDINGS: Marked osteopenia is present. There is  coalition of the tarsal bones. No acute fracture is evident. No radiopaque foreign body is present. IMPRESSION: 1. Moderate osteopenia. 2. Coalition of tarsal bones. 3. No acute abnormality. Electronically Signed   By: San Morelle M.D.   On: 03/14/2016 15:32   Dg Hip Unilat With Pelvis 2-3 Views Left  03/14/2016  CLINICAL DATA:  Left leg pain after fall last night from chair. EXAM: DG HIP (WITH OR WITHOUT PELVIS) 2-3V LEFT COMPARISON:  Radiographs of October 31, 2014. FINDINGS: There is no evidence of hip fracture or dislocation. There is no evidence of arthropathy or other focal bone abnormality. IMPRESSION: Normal left hip. Electronically Signed   By: Marijo Conception, M.D.   On: 03/14/2016 15:31   I have personally reviewed and evaluated these images and lab results as part of my medical decision-making.   EKG Interpretation None      MDM   Final diagnoses:  Foot injury, left, initial encounter  Patient can only transfer at baseline. Lives with her son who is able to give her some help at home. Her daughter, and son-in-law accompany her here. I have left a message with care manager, written for face-to-face home evaluation with [facility] in physical therapy. She may qualify for some additional services with Medicare. Do not feel she requires admission or immediate rehabilitation hospitalization at this time.  No concern for an occult hip fracture. She has full range of motion of the hip and can range it without difficulty.    Tanna Furry, MD 03/14/16 [phone]

## 2016-03-15 NOTE — Care Management Note (Signed)
Case Management Note  Patient Details  Name: Jacqueline Robles MRN: [id number] Date of Birth: 04/20/1922  Subjective/Objective:  Patient presents to Ed with fall sustaining injury to left foot.                  Action/Plan:  EDCM received phone call from [place] requesting follow up for home health services.  EDCM called Opal Sidles, patient's daughter [phone].  Opal Sidles reports patient lives at home with son Elta [place].  She reports she has private duty services with [employer] from 10-1 M-W-F.  She reports patient has a wheelchair, cane , walker and bedside commode at home.  EDCM offered choice of home health agencies and explained services.  EDCm explained that homehealth agency of her choice will have 24-48 hours to contact her.  Patient's daughter chose [facility] for home health services.  EDCM called referral into [facility].  Home health orders with face to face have already been placed in system.  No further EDCM needs at this time.  Expected Discharge Date:                  Expected Discharge Plan:  [place]  [place] Referral:     Discharge planning Services  CM Consult  Post Acute Care Choice:  Home Health Choice offered to:  Adult Children (Daughter Management consultant)  DME Arranged:   (none required per daughter) DME Agency:     HH Arranged:  RN, PT [place] Agency:  [facility]  Status of Service:  Completed, signed off  If discussed at [place] of Stay Meetings, dates discussed:    Additional CommentsLivia Snellen, RN 03/15/2016, 3:17 PM

## 2016-03-19 DIAGNOSIS — Z7902 Long term (current) use of antithrombotics/antiplatelets: Secondary | ICD-10-CM | POA: Diagnosis not present

## 2016-03-19 DIAGNOSIS — E079 Disorder of thyroid, unspecified: Secondary | ICD-10-CM | POA: Diagnosis not present

## 2016-03-19 DIAGNOSIS — Z9181 History of falling: Secondary | ICD-10-CM | POA: Diagnosis not present

## 2016-03-19 DIAGNOSIS — I10 Essential (primary) hypertension: Secondary | ICD-10-CM | POA: Diagnosis not present

## 2016-03-19 DIAGNOSIS — Z8673 Personal history of transient ischemic attack (TIA), and cerebral infarction without residual deficits: Secondary | ICD-10-CM | POA: Diagnosis not present

## 2016-03-19 DIAGNOSIS — M810 Age-related osteoporosis without current pathological fracture: Secondary | ICD-10-CM | POA: Diagnosis not present

## 2016-03-19 DIAGNOSIS — E785 Hyperlipidemia, unspecified: Secondary | ICD-10-CM | POA: Diagnosis not present

## 2016-03-19 DIAGNOSIS — F329 Major depressive disorder, single episode, unspecified: Secondary | ICD-10-CM | POA: Diagnosis not present

## 2016-03-19 DIAGNOSIS — Z87440 Personal history of urinary (tract) infections: Secondary | ICD-10-CM | POA: Diagnosis not present

## 2016-03-19 DIAGNOSIS — F0390 Unspecified dementia without behavioral disturbance: Secondary | ICD-10-CM | POA: Diagnosis not present

## 2016-03-19 DIAGNOSIS — S93402D Sprain of unspecified ligament of left ankle, subsequent encounter: Secondary | ICD-10-CM | POA: Diagnosis not present

## 2016-03-23 DIAGNOSIS — S93402D Sprain of unspecified ligament of left ankle, subsequent encounter: Secondary | ICD-10-CM | POA: Diagnosis not present

## 2016-03-23 DIAGNOSIS — I10 Essential (primary) hypertension: Secondary | ICD-10-CM | POA: Diagnosis not present

## 2016-03-23 DIAGNOSIS — E079 Disorder of thyroid, unspecified: Secondary | ICD-10-CM | POA: Diagnosis not present

## 2016-03-23 DIAGNOSIS — F329 Major depressive disorder, single episode, unspecified: Secondary | ICD-10-CM | POA: Diagnosis not present

## 2016-03-23 DIAGNOSIS — M810 Age-related osteoporosis without current pathological fracture: Secondary | ICD-10-CM | POA: Diagnosis not present

## 2016-03-23 DIAGNOSIS — F0390 Unspecified dementia without behavioral disturbance: Secondary | ICD-10-CM | POA: Diagnosis not present

## 2016-03-25 DIAGNOSIS — I10 Essential (primary) hypertension: Secondary | ICD-10-CM | POA: Diagnosis not present

## 2016-03-25 DIAGNOSIS — F0390 Unspecified dementia without behavioral disturbance: Secondary | ICD-10-CM | POA: Diagnosis not present

## 2016-03-25 DIAGNOSIS — E079 Disorder of thyroid, unspecified: Secondary | ICD-10-CM | POA: Diagnosis not present

## 2016-03-25 DIAGNOSIS — M810 Age-related osteoporosis without current pathological fracture: Secondary | ICD-10-CM | POA: Diagnosis not present

## 2016-03-25 DIAGNOSIS — S93402D Sprain of unspecified ligament of left ankle, subsequent encounter: Secondary | ICD-10-CM | POA: Diagnosis not present

## 2016-03-25 DIAGNOSIS — F329 Major depressive disorder, single episode, unspecified: Secondary | ICD-10-CM | POA: Diagnosis not present

## 2016-03-30 DIAGNOSIS — I10 Essential (primary) hypertension: Secondary | ICD-10-CM | POA: Diagnosis not present

## 2016-03-30 DIAGNOSIS — M810 Age-related osteoporosis without current pathological fracture: Secondary | ICD-10-CM | POA: Diagnosis not present

## 2016-03-30 DIAGNOSIS — E079 Disorder of thyroid, unspecified: Secondary | ICD-10-CM | POA: Diagnosis not present

## 2016-03-30 DIAGNOSIS — F329 Major depressive disorder, single episode, unspecified: Secondary | ICD-10-CM | POA: Diagnosis not present

## 2016-03-30 DIAGNOSIS — F0390 Unspecified dementia without behavioral disturbance: Secondary | ICD-10-CM | POA: Diagnosis not present

## 2016-03-30 DIAGNOSIS — S93402D Sprain of unspecified ligament of left ankle, subsequent encounter: Secondary | ICD-10-CM | POA: Diagnosis not present

## 2016-04-01 DIAGNOSIS — I10 Essential (primary) hypertension: Secondary | ICD-10-CM | POA: Diagnosis not present

## 2016-04-01 DIAGNOSIS — E079 Disorder of thyroid, unspecified: Secondary | ICD-10-CM | POA: Diagnosis not present

## 2016-04-01 DIAGNOSIS — S93402D Sprain of unspecified ligament of left ankle, subsequent encounter: Secondary | ICD-10-CM | POA: Diagnosis not present

## 2016-04-01 DIAGNOSIS — F0390 Unspecified dementia without behavioral disturbance: Secondary | ICD-10-CM | POA: Diagnosis not present

## 2016-04-01 DIAGNOSIS — M810 Age-related osteoporosis without current pathological fracture: Secondary | ICD-10-CM | POA: Diagnosis not present

## 2016-04-01 DIAGNOSIS — F329 Major depressive disorder, single episode, unspecified: Secondary | ICD-10-CM | POA: Diagnosis not present

## 2016-04-06 DIAGNOSIS — E079 Disorder of thyroid, unspecified: Secondary | ICD-10-CM | POA: Diagnosis not present

## 2016-04-06 DIAGNOSIS — F329 Major depressive disorder, single episode, unspecified: Secondary | ICD-10-CM | POA: Diagnosis not present

## 2016-04-06 DIAGNOSIS — M810 Age-related osteoporosis without current pathological fracture: Secondary | ICD-10-CM | POA: Diagnosis not present

## 2016-04-06 DIAGNOSIS — I10 Essential (primary) hypertension: Secondary | ICD-10-CM | POA: Diagnosis not present

## 2016-04-06 DIAGNOSIS — S93402D Sprain of unspecified ligament of left ankle, subsequent encounter: Secondary | ICD-10-CM | POA: Diagnosis not present

## 2016-04-06 DIAGNOSIS — F0390 Unspecified dementia without behavioral disturbance: Secondary | ICD-10-CM | POA: Diagnosis not present

## 2016-04-08 DIAGNOSIS — F329 Major depressive disorder, single episode, unspecified: Secondary | ICD-10-CM | POA: Diagnosis not present

## 2016-04-08 DIAGNOSIS — S93402D Sprain of unspecified ligament of left ankle, subsequent encounter: Secondary | ICD-10-CM | POA: Diagnosis not present

## 2016-04-08 DIAGNOSIS — E079 Disorder of thyroid, unspecified: Secondary | ICD-10-CM | POA: Diagnosis not present

## 2016-04-08 DIAGNOSIS — M810 Age-related osteoporosis without current pathological fracture: Secondary | ICD-10-CM | POA: Diagnosis not present

## 2016-04-08 DIAGNOSIS — F0390 Unspecified dementia without behavioral disturbance: Secondary | ICD-10-CM | POA: Diagnosis not present

## 2016-04-08 DIAGNOSIS — I10 Essential (primary) hypertension: Secondary | ICD-10-CM | POA: Diagnosis not present

## 2016-04-13 DIAGNOSIS — F329 Major depressive disorder, single episode, unspecified: Secondary | ICD-10-CM | POA: Diagnosis not present

## 2016-04-13 DIAGNOSIS — I10 Essential (primary) hypertension: Secondary | ICD-10-CM | POA: Diagnosis not present

## 2016-04-13 DIAGNOSIS — F0390 Unspecified dementia without behavioral disturbance: Secondary | ICD-10-CM | POA: Diagnosis not present

## 2016-04-13 DIAGNOSIS — M810 Age-related osteoporosis without current pathological fracture: Secondary | ICD-10-CM | POA: Diagnosis not present

## 2016-04-13 DIAGNOSIS — E079 Disorder of thyroid, unspecified: Secondary | ICD-10-CM | POA: Diagnosis not present

## 2016-04-13 DIAGNOSIS — S93402D Sprain of unspecified ligament of left ankle, subsequent encounter: Secondary | ICD-10-CM | POA: Diagnosis not present

## 2016-04-14 DIAGNOSIS — M810 Age-related osteoporosis without current pathological fracture: Secondary | ICD-10-CM | POA: Diagnosis not present

## 2016-04-14 DIAGNOSIS — I10 Essential (primary) hypertension: Secondary | ICD-10-CM | POA: Diagnosis not present

## 2016-04-14 DIAGNOSIS — E079 Disorder of thyroid, unspecified: Secondary | ICD-10-CM | POA: Diagnosis not present

## 2016-04-14 DIAGNOSIS — S93402D Sprain of unspecified ligament of left ankle, subsequent encounter: Secondary | ICD-10-CM | POA: Diagnosis not present

## 2016-04-14 DIAGNOSIS — F329 Major depressive disorder, single episode, unspecified: Secondary | ICD-10-CM | POA: Diagnosis not present

## 2016-04-14 DIAGNOSIS — F0390 Unspecified dementia without behavioral disturbance: Secondary | ICD-10-CM | POA: Diagnosis not present

## 2016-04-15 DIAGNOSIS — S93402D Sprain of unspecified ligament of left ankle, subsequent encounter: Secondary | ICD-10-CM | POA: Diagnosis not present

## 2016-04-15 DIAGNOSIS — F329 Major depressive disorder, single episode, unspecified: Secondary | ICD-10-CM | POA: Diagnosis not present

## 2016-04-15 DIAGNOSIS — F0390 Unspecified dementia without behavioral disturbance: Secondary | ICD-10-CM | POA: Diagnosis not present

## 2016-04-15 DIAGNOSIS — M810 Age-related osteoporosis without current pathological fracture: Secondary | ICD-10-CM | POA: Diagnosis not present

## 2016-04-15 DIAGNOSIS — I10 Essential (primary) hypertension: Secondary | ICD-10-CM | POA: Diagnosis not present

## 2016-04-15 DIAGNOSIS — E079 Disorder of thyroid, unspecified: Secondary | ICD-10-CM | POA: Diagnosis not present

## 2016-04-20 DIAGNOSIS — M810 Age-related osteoporosis without current pathological fracture: Secondary | ICD-10-CM | POA: Diagnosis not present

## 2016-04-20 DIAGNOSIS — F0390 Unspecified dementia without behavioral disturbance: Secondary | ICD-10-CM | POA: Diagnosis not present

## 2016-04-20 DIAGNOSIS — S93402D Sprain of unspecified ligament of left ankle, subsequent encounter: Secondary | ICD-10-CM | POA: Diagnosis not present

## 2016-04-20 DIAGNOSIS — I10 Essential (primary) hypertension: Secondary | ICD-10-CM | POA: Diagnosis not present

## 2016-04-20 DIAGNOSIS — E079 Disorder of thyroid, unspecified: Secondary | ICD-10-CM | POA: Diagnosis not present

## 2016-04-20 DIAGNOSIS — F329 Major depressive disorder, single episode, unspecified: Secondary | ICD-10-CM | POA: Diagnosis not present

## 2016-04-22 DIAGNOSIS — I10 Essential (primary) hypertension: Secondary | ICD-10-CM | POA: Diagnosis not present

## 2016-04-22 DIAGNOSIS — S93402D Sprain of unspecified ligament of left ankle, subsequent encounter: Secondary | ICD-10-CM | POA: Diagnosis not present

## 2016-04-22 DIAGNOSIS — F329 Major depressive disorder, single episode, unspecified: Secondary | ICD-10-CM | POA: Diagnosis not present

## 2016-04-22 DIAGNOSIS — F0390 Unspecified dementia without behavioral disturbance: Secondary | ICD-10-CM | POA: Diagnosis not present

## 2016-04-22 DIAGNOSIS — E079 Disorder of thyroid, unspecified: Secondary | ICD-10-CM | POA: Diagnosis not present

## 2016-04-22 DIAGNOSIS — M810 Age-related osteoporosis without current pathological fracture: Secondary | ICD-10-CM | POA: Diagnosis not present

## 2016-04-27 DIAGNOSIS — E079 Disorder of thyroid, unspecified: Secondary | ICD-10-CM | POA: Diagnosis not present

## 2016-04-27 DIAGNOSIS — S93402D Sprain of unspecified ligament of left ankle, subsequent encounter: Secondary | ICD-10-CM | POA: Diagnosis not present

## 2016-04-27 DIAGNOSIS — F329 Major depressive disorder, single episode, unspecified: Secondary | ICD-10-CM | POA: Diagnosis not present

## 2016-04-27 DIAGNOSIS — M810 Age-related osteoporosis without current pathological fracture: Secondary | ICD-10-CM | POA: Diagnosis not present

## 2016-04-27 DIAGNOSIS — F0390 Unspecified dementia without behavioral disturbance: Secondary | ICD-10-CM | POA: Diagnosis not present

## 2016-04-27 DIAGNOSIS — I10 Essential (primary) hypertension: Secondary | ICD-10-CM | POA: Diagnosis not present

## 2016-04-29 DIAGNOSIS — I10 Essential (primary) hypertension: Secondary | ICD-10-CM | POA: Diagnosis not present

## 2016-04-29 DIAGNOSIS — S93402D Sprain of unspecified ligament of left ankle, subsequent encounter: Secondary | ICD-10-CM | POA: Diagnosis not present

## 2016-04-29 DIAGNOSIS — F329 Major depressive disorder, single episode, unspecified: Secondary | ICD-10-CM | POA: Diagnosis not present

## 2016-04-29 DIAGNOSIS — M810 Age-related osteoporosis without current pathological fracture: Secondary | ICD-10-CM | POA: Diagnosis not present

## 2016-04-29 DIAGNOSIS — E079 Disorder of thyroid, unspecified: Secondary | ICD-10-CM | POA: Diagnosis not present

## 2016-04-29 DIAGNOSIS — F0390 Unspecified dementia without behavioral disturbance: Secondary | ICD-10-CM | POA: Diagnosis not present

## 2016-05-04 DIAGNOSIS — I10 Essential (primary) hypertension: Secondary | ICD-10-CM | POA: Diagnosis not present

## 2016-05-04 DIAGNOSIS — M810 Age-related osteoporosis without current pathological fracture: Secondary | ICD-10-CM | POA: Diagnosis not present

## 2016-05-04 DIAGNOSIS — E079 Disorder of thyroid, unspecified: Secondary | ICD-10-CM | POA: Diagnosis not present

## 2016-05-04 DIAGNOSIS — F329 Major depressive disorder, single episode, unspecified: Secondary | ICD-10-CM | POA: Diagnosis not present

## 2016-05-04 DIAGNOSIS — F0390 Unspecified dementia without behavioral disturbance: Secondary | ICD-10-CM | POA: Diagnosis not present

## 2016-05-04 DIAGNOSIS — S93402D Sprain of unspecified ligament of left ankle, subsequent encounter: Secondary | ICD-10-CM | POA: Diagnosis not present

## 2016-05-06 DIAGNOSIS — E079 Disorder of thyroid, unspecified: Secondary | ICD-10-CM | POA: Diagnosis not present

## 2016-05-06 DIAGNOSIS — F0390 Unspecified dementia without behavioral disturbance: Secondary | ICD-10-CM | POA: Diagnosis not present

## 2016-05-06 DIAGNOSIS — I10 Essential (primary) hypertension: Secondary | ICD-10-CM | POA: Diagnosis not present

## 2016-05-06 DIAGNOSIS — M810 Age-related osteoporosis without current pathological fracture: Secondary | ICD-10-CM | POA: Diagnosis not present

## 2016-05-06 DIAGNOSIS — F329 Major depressive disorder, single episode, unspecified: Secondary | ICD-10-CM | POA: Diagnosis not present

## 2016-05-06 DIAGNOSIS — S93402D Sprain of unspecified ligament of left ankle, subsequent encounter: Secondary | ICD-10-CM | POA: Diagnosis not present

## 2016-05-11 DIAGNOSIS — M810 Age-related osteoporosis without current pathological fracture: Secondary | ICD-10-CM | POA: Diagnosis not present

## 2016-05-11 DIAGNOSIS — I10 Essential (primary) hypertension: Secondary | ICD-10-CM | POA: Diagnosis not present

## 2016-05-11 DIAGNOSIS — E079 Disorder of thyroid, unspecified: Secondary | ICD-10-CM | POA: Diagnosis not present

## 2016-05-11 DIAGNOSIS — F329 Major depressive disorder, single episode, unspecified: Secondary | ICD-10-CM | POA: Diagnosis not present

## 2016-05-11 DIAGNOSIS — S93402D Sprain of unspecified ligament of left ankle, subsequent encounter: Secondary | ICD-10-CM | POA: Diagnosis not present

## 2016-05-11 DIAGNOSIS — F0390 Unspecified dementia without behavioral disturbance: Secondary | ICD-10-CM | POA: Diagnosis not present

## 2016-05-14 DIAGNOSIS — F329 Major depressive disorder, single episode, unspecified: Secondary | ICD-10-CM | POA: Diagnosis not present

## 2016-05-14 DIAGNOSIS — E079 Disorder of thyroid, unspecified: Secondary | ICD-10-CM | POA: Diagnosis not present

## 2016-05-14 DIAGNOSIS — M810 Age-related osteoporosis without current pathological fracture: Secondary | ICD-10-CM | POA: Diagnosis not present

## 2016-05-14 DIAGNOSIS — I10 Essential (primary) hypertension: Secondary | ICD-10-CM | POA: Diagnosis not present

## 2016-05-14 DIAGNOSIS — F0390 Unspecified dementia without behavioral disturbance: Secondary | ICD-10-CM | POA: Diagnosis not present

## 2016-05-14 DIAGNOSIS — S93402D Sprain of unspecified ligament of left ankle, subsequent encounter: Secondary | ICD-10-CM | POA: Diagnosis not present

## 2016-05-18 DIAGNOSIS — Z7902 Long term (current) use of antithrombotics/antiplatelets: Secondary | ICD-10-CM | POA: Diagnosis not present

## 2016-05-18 DIAGNOSIS — Z9181 History of falling: Secondary | ICD-10-CM | POA: Diagnosis not present

## 2016-05-18 DIAGNOSIS — Z87440 Personal history of urinary (tract) infections: Secondary | ICD-10-CM | POA: Diagnosis not present

## 2016-05-18 DIAGNOSIS — E079 Disorder of thyroid, unspecified: Secondary | ICD-10-CM | POA: Diagnosis not present

## 2016-05-18 DIAGNOSIS — F0390 Unspecified dementia without behavioral disturbance: Secondary | ICD-10-CM | POA: Diagnosis not present

## 2016-05-18 DIAGNOSIS — Z8673 Personal history of transient ischemic attack (TIA), and cerebral infarction without residual deficits: Secondary | ICD-10-CM | POA: Diagnosis not present

## 2016-05-18 DIAGNOSIS — S93402D Sprain of unspecified ligament of left ankle, subsequent encounter: Secondary | ICD-10-CM | POA: Diagnosis not present

## 2016-05-18 DIAGNOSIS — E785 Hyperlipidemia, unspecified: Secondary | ICD-10-CM | POA: Diagnosis not present

## 2016-05-18 DIAGNOSIS — F329 Major depressive disorder, single episode, unspecified: Secondary | ICD-10-CM | POA: Diagnosis not present

## 2016-05-18 DIAGNOSIS — I10 Essential (primary) hypertension: Secondary | ICD-10-CM | POA: Diagnosis not present

## 2016-05-18 DIAGNOSIS — M810 Age-related osteoporosis without current pathological fracture: Secondary | ICD-10-CM | POA: Diagnosis not present

## 2016-05-20 DIAGNOSIS — E079 Disorder of thyroid, unspecified: Secondary | ICD-10-CM | POA: Diagnosis not present

## 2016-05-20 DIAGNOSIS — M810 Age-related osteoporosis without current pathological fracture: Secondary | ICD-10-CM | POA: Diagnosis not present

## 2016-05-20 DIAGNOSIS — F0390 Unspecified dementia without behavioral disturbance: Secondary | ICD-10-CM | POA: Diagnosis not present

## 2016-05-20 DIAGNOSIS — F329 Major depressive disorder, single episode, unspecified: Secondary | ICD-10-CM | POA: Diagnosis not present

## 2016-05-20 DIAGNOSIS — I10 Essential (primary) hypertension: Secondary | ICD-10-CM | POA: Diagnosis not present

## 2016-05-20 DIAGNOSIS — S93402D Sprain of unspecified ligament of left ankle, subsequent encounter: Secondary | ICD-10-CM | POA: Diagnosis not present

## 2016-05-25 DIAGNOSIS — I10 Essential (primary) hypertension: Secondary | ICD-10-CM | POA: Diagnosis not present

## 2016-05-25 DIAGNOSIS — M810 Age-related osteoporosis without current pathological fracture: Secondary | ICD-10-CM | POA: Diagnosis not present

## 2016-05-25 DIAGNOSIS — S93402D Sprain of unspecified ligament of left ankle, subsequent encounter: Secondary | ICD-10-CM | POA: Diagnosis not present

## 2016-05-25 DIAGNOSIS — E079 Disorder of thyroid, unspecified: Secondary | ICD-10-CM | POA: Diagnosis not present

## 2016-05-25 DIAGNOSIS — F0390 Unspecified dementia without behavioral disturbance: Secondary | ICD-10-CM | POA: Diagnosis not present

## 2016-05-25 DIAGNOSIS — F329 Major depressive disorder, single episode, unspecified: Secondary | ICD-10-CM | POA: Diagnosis not present

## 2016-05-28 DIAGNOSIS — M810 Age-related osteoporosis without current pathological fracture: Secondary | ICD-10-CM | POA: Diagnosis not present

## 2016-05-28 DIAGNOSIS — S93402D Sprain of unspecified ligament of left ankle, subsequent encounter: Secondary | ICD-10-CM | POA: Diagnosis not present

## 2016-05-28 DIAGNOSIS — F0390 Unspecified dementia without behavioral disturbance: Secondary | ICD-10-CM | POA: Diagnosis not present

## 2016-05-28 DIAGNOSIS — E079 Disorder of thyroid, unspecified: Secondary | ICD-10-CM | POA: Diagnosis not present

## 2016-05-28 DIAGNOSIS — I10 Essential (primary) hypertension: Secondary | ICD-10-CM | POA: Diagnosis not present

## 2016-05-28 DIAGNOSIS — F329 Major depressive disorder, single episode, unspecified: Secondary | ICD-10-CM | POA: Diagnosis not present

## 2016-05-31 DIAGNOSIS — Z23 Encounter for immunization: Secondary | ICD-10-CM | POA: Diagnosis not present

## 2016-06-01 DIAGNOSIS — I10 Essential (primary) hypertension: Secondary | ICD-10-CM | POA: Diagnosis not present

## 2016-06-01 DIAGNOSIS — S93402D Sprain of unspecified ligament of left ankle, subsequent encounter: Secondary | ICD-10-CM | POA: Diagnosis not present

## 2016-06-01 DIAGNOSIS — M810 Age-related osteoporosis without current pathological fracture: Secondary | ICD-10-CM | POA: Diagnosis not present

## 2016-06-01 DIAGNOSIS — F0390 Unspecified dementia without behavioral disturbance: Secondary | ICD-10-CM | POA: Diagnosis not present

## 2016-06-01 DIAGNOSIS — F329 Major depressive disorder, single episode, unspecified: Secondary | ICD-10-CM | POA: Diagnosis not present

## 2016-06-01 DIAGNOSIS — E079 Disorder of thyroid, unspecified: Secondary | ICD-10-CM | POA: Diagnosis not present

## 2016-06-03 DIAGNOSIS — E079 Disorder of thyroid, unspecified: Secondary | ICD-10-CM | POA: Diagnosis not present

## 2016-06-03 DIAGNOSIS — F0390 Unspecified dementia without behavioral disturbance: Secondary | ICD-10-CM | POA: Diagnosis not present

## 2016-06-03 DIAGNOSIS — M810 Age-related osteoporosis without current pathological fracture: Secondary | ICD-10-CM | POA: Diagnosis not present

## 2016-06-03 DIAGNOSIS — S93402D Sprain of unspecified ligament of left ankle, subsequent encounter: Secondary | ICD-10-CM | POA: Diagnosis not present

## 2016-06-03 DIAGNOSIS — F329 Major depressive disorder, single episode, unspecified: Secondary | ICD-10-CM | POA: Diagnosis not present

## 2016-06-03 DIAGNOSIS — I10 Essential (primary) hypertension: Secondary | ICD-10-CM | POA: Diagnosis not present

## 2016-06-08 DIAGNOSIS — F0390 Unspecified dementia without behavioral disturbance: Secondary | ICD-10-CM | POA: Diagnosis not present

## 2016-06-08 DIAGNOSIS — S93402D Sprain of unspecified ligament of left ankle, subsequent encounter: Secondary | ICD-10-CM | POA: Diagnosis not present

## 2016-06-08 DIAGNOSIS — E079 Disorder of thyroid, unspecified: Secondary | ICD-10-CM | POA: Diagnosis not present

## 2016-06-08 DIAGNOSIS — M810 Age-related osteoporosis without current pathological fracture: Secondary | ICD-10-CM | POA: Diagnosis not present

## 2016-06-08 DIAGNOSIS — I10 Essential (primary) hypertension: Secondary | ICD-10-CM | POA: Diagnosis not present

## 2016-06-08 DIAGNOSIS — F329 Major depressive disorder, single episode, unspecified: Secondary | ICD-10-CM | POA: Diagnosis not present

## 2016-06-10 DIAGNOSIS — F0390 Unspecified dementia without behavioral disturbance: Secondary | ICD-10-CM | POA: Diagnosis not present

## 2016-06-10 DIAGNOSIS — I10 Essential (primary) hypertension: Secondary | ICD-10-CM | POA: Diagnosis not present

## 2016-06-10 DIAGNOSIS — S93402D Sprain of unspecified ligament of left ankle, subsequent encounter: Secondary | ICD-10-CM | POA: Diagnosis not present

## 2016-06-10 DIAGNOSIS — E079 Disorder of thyroid, unspecified: Secondary | ICD-10-CM | POA: Diagnosis not present

## 2016-06-10 DIAGNOSIS — F329 Major depressive disorder, single episode, unspecified: Secondary | ICD-10-CM | POA: Diagnosis not present

## 2016-06-10 DIAGNOSIS — M810 Age-related osteoporosis without current pathological fracture: Secondary | ICD-10-CM | POA: Diagnosis not present

## 2016-06-15 DIAGNOSIS — E079 Disorder of thyroid, unspecified: Secondary | ICD-10-CM | POA: Diagnosis not present

## 2016-06-15 DIAGNOSIS — I10 Essential (primary) hypertension: Secondary | ICD-10-CM | POA: Diagnosis not present

## 2016-06-15 DIAGNOSIS — F0390 Unspecified dementia without behavioral disturbance: Secondary | ICD-10-CM | POA: Diagnosis not present

## 2016-06-15 DIAGNOSIS — M810 Age-related osteoporosis without current pathological fracture: Secondary | ICD-10-CM | POA: Diagnosis not present

## 2016-06-15 DIAGNOSIS — F329 Major depressive disorder, single episode, unspecified: Secondary | ICD-10-CM | POA: Diagnosis not present

## 2016-06-15 DIAGNOSIS — S93402D Sprain of unspecified ligament of left ankle, subsequent encounter: Secondary | ICD-10-CM | POA: Diagnosis not present

## 2016-06-17 DIAGNOSIS — F329 Major depressive disorder, single episode, unspecified: Secondary | ICD-10-CM | POA: Diagnosis not present

## 2016-06-17 DIAGNOSIS — M810 Age-related osteoporosis without current pathological fracture: Secondary | ICD-10-CM | POA: Diagnosis not present

## 2016-06-17 DIAGNOSIS — I10 Essential (primary) hypertension: Secondary | ICD-10-CM | POA: Diagnosis not present

## 2016-06-17 DIAGNOSIS — F0390 Unspecified dementia without behavioral disturbance: Secondary | ICD-10-CM | POA: Diagnosis not present

## 2016-06-17 DIAGNOSIS — E079 Disorder of thyroid, unspecified: Secondary | ICD-10-CM | POA: Diagnosis not present

## 2016-06-17 DIAGNOSIS — S93402D Sprain of unspecified ligament of left ankle, subsequent encounter: Secondary | ICD-10-CM | POA: Diagnosis not present

## 2016-07-07 DIAGNOSIS — I129 Hypertensive chronic kidney disease with stage 1 through stage 4 chronic kidney disease, or unspecified chronic kidney disease: Secondary | ICD-10-CM | POA: Diagnosis not present

## 2016-07-07 DIAGNOSIS — I350 Nonrheumatic aortic (valve) stenosis: Secondary | ICD-10-CM | POA: Diagnosis not present

## 2016-07-07 DIAGNOSIS — Z79899 Other long term (current) drug therapy: Secondary | ICD-10-CM | POA: Diagnosis not present

## 2016-07-07 DIAGNOSIS — E039 Hypothyroidism, unspecified: Secondary | ICD-10-CM | POA: Diagnosis not present

## 2016-07-07 DIAGNOSIS — N183 Chronic kidney disease, stage 3 (moderate): Secondary | ICD-10-CM | POA: Diagnosis not present

## 2016-10-26 DIAGNOSIS — N390 Urinary tract infection, site not specified: Secondary | ICD-10-CM | POA: Diagnosis not present

## 2016-12-03 ENCOUNTER — Emergency Department (HOSPITAL_COMMUNITY)
Admission: EM | Admit: 2016-12-03 | Discharge: 2016-12-04 | Disposition: A | Discharge status: Home / Self Care | Payer: Medicare Other | Attending: Emergency Medicine | Admitting: Emergency Medicine

## 2016-12-03 ENCOUNTER — Emergency Department (HOSPITAL_COMMUNITY): Payer: Medicare Other

## 2016-12-03 ENCOUNTER — Encounter (HOSPITAL_COMMUNITY): Payer: Self-pay | Admitting: Emergency Medicine

## 2016-12-03 DIAGNOSIS — J189 Pneumonia, unspecified organism: Secondary | ICD-10-CM | POA: Diagnosis not present

## 2016-12-03 DIAGNOSIS — E039 Hypothyroidism, unspecified: Secondary | ICD-10-CM | POA: Diagnosis not present

## 2016-12-03 DIAGNOSIS — Z7902 Long term (current) use of antithrombotics/antiplatelets: Secondary | ICD-10-CM | POA: Insufficient documentation

## 2016-12-03 DIAGNOSIS — J9811 Atelectasis: Secondary | ICD-10-CM | POA: Diagnosis not present

## 2016-12-03 DIAGNOSIS — Z91040 Latex allergy status: Secondary | ICD-10-CM | POA: Insufficient documentation

## 2016-12-03 DIAGNOSIS — I10 Essential (primary) hypertension: Secondary | ICD-10-CM | POA: Insufficient documentation

## 2016-12-03 DIAGNOSIS — Z79899 Other long term (current) drug therapy: Secondary | ICD-10-CM | POA: Diagnosis not present

## 2016-12-03 DIAGNOSIS — J181 Lobar pneumonia, unspecified organism: Secondary | ICD-10-CM | POA: Diagnosis not present

## 2016-12-03 DIAGNOSIS — R402441 Other coma, without documented Glasgow coma scale score, or with partial score reported, in the field [EMT or ambulance]: Secondary | ICD-10-CM | POA: Diagnosis not present

## 2016-12-03 DIAGNOSIS — R531 Weakness: Secondary | ICD-10-CM | POA: Diagnosis present

## 2016-12-03 LAB — URINALYSIS, ROUTINE W REFLEX MICROSCOPIC
Bilirubin Urine: NEGATIVE
Glucose, UA: NEGATIVE mg/dL
Hgb urine dipstick: NEGATIVE
KETONES UR: NEGATIVE mg/dL
NITRITE: NEGATIVE
PH: 6 (ref 5.0–8.0)
PROTEIN: NEGATIVE mg/dL
Specific Gravity, Urine: 1.014 (ref 1.005–1.030)

## 2016-12-03 LAB — COMPREHENSIVE METABOLIC PANEL
ALK PHOS: 51 U/L (ref 38–126)
ALT: 13 U/L — AB (ref 14–54)
AST: 44 U/L — ABNORMAL HIGH (ref 15–41)
Albumin: 3.1 g/dL — ABNORMAL LOW (ref 3.5–5.0)
Anion gap: 6 (ref 5–15)
BILIRUBIN TOTAL: 2 mg/dL — AB (ref 0.3–1.2)
BUN: 12 mg/dL (ref 6–20)
CALCIUM: 8.6 mg/dL — AB (ref 8.9–10.3)
CO2: 23 mmol/L (ref 22–32)
CREATININE: 1.19 mg/dL — AB (ref 0.44–1.00)
Chloride: 102 mmol/L (ref 101–111)
GFR calc non Af Amer: 38 mL/min — ABNORMAL LOW (ref >60)
GFR, EST AFRICAN AMERICAN: 45 mL/min — AB (ref >60)
Glucose, Bld: 149 mg/dL — ABNORMAL HIGH (ref 65–99)
Potassium: 4.8 mmol/L (ref 3.5–5.1)
SODIUM: 131 mmol/L — AB (ref 135–145)
Total Protein: 7 g/dL (ref 6.5–8.1)

## 2016-12-03 LAB — CBC
HEMATOCRIT: 33.5 % — AB (ref 36.0–46.0)
HEMOGLOBIN: 11.5 g/dL — AB (ref 12.0–15.0)
MCH: 31.5 pg (ref 26.0–34.0)
MCHC: 34.3 g/dL (ref 30.0–36.0)
MCV: 91.8 fL (ref 78.0–100.0)
Platelets: 202 10*3/uL (ref 150–400)
RBC: 3.65 MIL/uL — AB (ref 3.87–5.11)
RDW: 14.1 % (ref 11.5–15.5)
WBC: 10.4 10*3/uL (ref 4.0–10.5)

## 2016-12-03 MED ORDER — SODIUM CHLORIDE 0.9 % IV BOLUS (SEPSIS)
500.0000 mL | Freq: Once | INTRAVENOUS | Status: AC
  Administered 2016-12-03: 500 mL via INTRAVENOUS

## 2016-12-03 MED ORDER — CEFDINIR 300 MG PO CAPS: 300.0000 mg | ORAL_CAPSULE | Freq: Two times a day (BID) | ORAL | 0 refills | Status: DC

## 2016-12-03 MED ORDER — SODIUM CHLORIDE 0.9 % IV SOLN
INTRAVENOUS | Status: DC
  Administered 2016-12-03: 20 mL/h via INTRAVENOUS

## 2016-12-03 MED ORDER — DEXTROSE 5 % IV SOLN
1.0000 g | Freq: Once | INTRAVENOUS | Status: AC
  Administered 2016-12-03: 1 g via INTRAVENOUS
  Filled 2016-12-03: qty 10

## 2016-12-03 MED ORDER — DEXTROSE 5 % IV SOLN
500.0000 mg | Freq: Once | INTRAVENOUS | Status: AC
  Administered 2016-12-03: 500 mg via INTRAVENOUS
  Filled 2016-12-03: qty 500

## 2016-12-03 NOTE — ED Notes (Signed)
Called [facility] to check arrival pickup per family request

## 2016-12-03 NOTE — ED Notes (Signed)
Patient transported to X-ray 

## 2016-12-03 NOTE — ED Notes (Signed)
Family requesting approximate time for transport. RN contacted [facility] states they cannot give an estimated time. Family made aware.

## 2016-12-03 NOTE — ED Notes (Signed)
ED Provider at bedside. 

## 2016-12-03 NOTE — ED Triage Notes (Signed)
Per EMS, family of pt called out d/t patient being "more altered than normal". Pt is from home and has a hx of dementia and hypertension.    Family also states pt had a UTI recently but that she took the full course of antibiotics prescribed.  Pt has a 20g IV in left AC placed by EMS.

## 2016-12-03 NOTE — Discharge Instructions (Signed)
It was our pleasure to provide your ER care today - we hope that you feel better.  Take antibiotic as prescribed.  Drink plenty of fluids.  Follow up with primary care doctor in the next few days.  Return to ER if worse, trouble breathing, other concern.

## 2016-12-03 NOTE — ED Provider Notes (Signed)
[facility] DEPT Provider Note   CSN: [id number] Arrival date & time: 12/03/16  1317     History   Chief Complaint Chief Complaint  Patient presents with  . Recent UTI    HPI Jacqueline Robles is a 83 y.o. female.  Patient with hx dementia. c/o in past week, generalized weakness, trouble walking w walker, increased confusion, similar symptoms to prior uti. Symptoms moderate, persistent, worse today. No trauma or fall. No syncope. Hx utis, not on abx now. T 99 at home today. Eating a bit less than normal. No vomiting or diarrhea.  No change in meds. Denies headache. No cp or sob. No abd pain.    The history is provided by the patient and a relative. The history is limited by the condition of the patient.    Past Medical History:  Diagnosis Date  . C. difficile colitis   . Depression   . Hyperlipidemia   . Hypertension   . Osteoporosis   . Thyroid disease   . UTI (lower urinary tract infection)     Patient Active Problem List   Diagnosis Date Noted  . Encounter for family conference with patient present 12/04/2014  . Candidal diaper dermatitis 12/04/2014  . Agitation 11/10/2014  . C. difficile colitis 11/04/2014  . Fever 11/03/2014  . Aortic stenosis 11/01/2014  . Hypothyroidism 11/01/2014  . Hyperlipidemia 11/01/2014  . UTI (lower urinary tract infection) 11/01/2014  . Sacral insufficiency fracture 11/01/2014  . Sacral fracture, closed ([place]) 10/31/2014    Past Surgical History:  Procedure Laterality Date  . FOOT SURGERY    . neck surgery      OB History    No data available       Home Medications    Prior to Admission medications   Medication Sig Start Date End Date Taking? Authorizing Provider  acetaminophen (TYLENOL) 500 MG tablet Take 500-1,000 mg by mouth every 6 (six) hours as needed for moderate pain.    Historical Provider, MD  acidophilus (RISAQUAD) CAPS capsule Take 1 capsule by mouth daily.    Historical Provider, MD  Calcium  Carb-Cholecalciferol (CALCIUM 600 + D) 600-200 MG-UNIT TABS Take by mouth.    Historical Provider, MD  cholecalciferol (VITAMIN D) 1000 units tablet Take 1,000 Units by mouth daily.    Historical Provider, MD  clopidogrel (PLAVIX) 75 MG tablet Take 75 mg by mouth daily.    Historical Provider, MD  fexofenadine (ALLEGRA) 180 MG tablet Take 180 mg by mouth daily.    Historical Provider, MD  fluticasone (FLONASE) 50 MCG/ACT nasal spray Place 1 spray into both nostrils daily as needed for allergies.     Historical Provider, MD  HYDROcodone-acetaminophen (NORCO/VICODIN) 5-325 MG tablet One half tablet by mouth every 6 hours for pain. He had some sedation effects. 03/14/16   Tanna Furry, MD  HYDROcodone-acetaminophen (NORCO/VICODIN) 5-325 MG tablet One half tablet every 6 hours as needed for pain 03/14/16   Tanna Furry, MD  levothyroxine (SYNTHROID, LEVOTHROID) 75 MCG tablet Take 75 mcg by mouth daily before breakfast.    Historical Provider, MD  LORazepam (ATIVAN) 0.5 MG tablet Take 1 tablet (0.5 mg total) by mouth every 8 (eight) hours as needed for anxiety. Patient not taking: Reported on 03/14/2016 11/05/14   Orson Eva, MD  Melatonin 10 MG CAPS Take 10 mg by mouth at bedtime.    Historical Provider, MD  montelukast (SINGULAIR) 10 MG tablet Take 10 mg by mouth at bedtime.    Historical Provider, MD  Multiple Vitamins-Minerals (MULTIVITAMIN WITH MINERALS) tablet Take 1 tablet by mouth daily.    Historical Provider, MD  Multiple Vitamins-Minerals (PRESERVISION AREDS 2 PO) Take 2 capsules by mouth 2 (two) times daily.    Historical Provider, MD  Olopatadine HCl (PATADAY) 0.2 % SOLN Place 1 drop into both eyes daily.    Historical Provider, MD  sertraline (ZOLOFT) 25 MG tablet Take 25 mg by mouth daily.    Historical Provider, MD    Family History Family History  Problem Relation Age of Onset  . Hypertension Son     Social History Social History  Substance Use Topics  . Smoking status: Never Smoker  .  Smokeless tobacco: Not on file  . Alcohol use No     Allergies   Fosamax [alendronate sodium]; Hctz [hydrochlorothiazide]; Latex; Neosporin [neomycin-bacitracin zn-polymyx]; Norvasc [amlodipine besylate]; Bacitracin-neomycin-polymyxin; Lanolin; Naproxen; and Polysporin [bacitracin-polymyxin b]   Review of Systems Review of Systems  Constitutional: Negative for fever.  HENT: Negative for sore throat.   Eyes: Negative for redness.  Respiratory: Negative for cough and shortness of breath.   Cardiovascular: Negative for chest pain.  Gastrointestinal: Negative for abdominal pain.  Genitourinary: Negative for flank pain.  Musculoskeletal: Negative for back pain.  Skin: Negative for rash.  Neurological: Negative for headaches.  Hematological: Does not bruise/bleed easily.  Psychiatric/Behavioral: Positive for confusion.     Physical Exam Updated Vital Signs BP 102/76 (BP Location: Right Arm)   Pulse (!) 59   Temp 99.3 F (37.4 C) (Oral)   Resp 18   Ht 5\' 4"  (1.626 m)   Wt 59 kg   SpO2 99%   BMI 22.31 kg/m   Physical Exam  Constitutional: She appears well-developed and well-nourished. No distress.  HENT:  Head: Atraumatic.  Nose: Nose normal.  Eyes: Conjunctivae are normal. Pupils are equal, round, and reactive to light. No scleral icterus.  Neck: Neck supple. No tracheal deviation present. No thyromegaly present.  No stiffness or rigidity.   Cardiovascular: Normal rate, regular rhythm, normal heart sounds and intact distal pulses.   Pulmonary/Chest: Effort normal and breath sounds normal. No respiratory distress.  Abdominal: Soft. Normal appearance and bowel sounds are normal. She exhibits no distension. There is no tenderness.  Genitourinary:  Genitourinary Comments: No cva tenderness  Musculoskeletal: She exhibits no edema.  Neurological: She is alert.  Awake and alert. Motor intact bil, stre 5/5. sens grossly intact.   Skin: Skin is warm and dry. No rash noted. She  is not diaphoretic.  Psychiatric: She has a normal mood and affect.  Nursing note and vitals reviewed.    ED Treatments / Results  Labs (all labs ordered are listed, but only abnormal results are displayed) Results for orders placed or performed during the hospital encounter of 12/03/16  CBC  Result Value Ref Range   WBC 10.4 4.0 - 10.5 K/uL   RBC 3.65 (L) 3.87 - 5.11 MIL/uL   Hemoglobin 11.5 (L) 12.0 - 15.0 g/dL   HCT 33.5 (L) 36.0 - 46.0 %   MCV 91.8 78.0 - 100.0 fL   MCH 31.5 26.0 - 34.0 pg   MCHC 34.3 30.0 - 36.0 g/dL   RDW 14.1 11.5 - 15.5 %   Platelets 202 150 - 400 K/uL  Comprehensive metabolic panel  Result Value Ref Range   Sodium 131 (L) 135 - 145 mmol/L   Potassium 4.8 3.5 - 5.1 mmol/L   Chloride 102 101 - 111 mmol/L   CO2 23 22 - 32  mmol/L   Glucose, Bld 149 (H) 65 - 99 mg/dL   BUN 12 6 - 20 mg/dL   Creatinine, Ser 1.19 (H) 0.44 - 1.00 mg/dL   Calcium 8.6 (L) 8.9 - 10.3 mg/dL   Total Protein 7.0 6.5 - 8.1 g/dL   Albumin 3.1 (L) 3.5 - 5.0 g/dL   AST 44 (H) 15 - 41 U/L   ALT 13 (L) 14 - 54 U/L   Alkaline Phosphatase 51 38 - 126 U/L   Total Bilirubin 2.0 (H) 0.3 - 1.2 mg/dL   GFR calc non Af Amer 38 (L) >60 mL/min   GFR calc Af Amer 45 (L) >60 mL/min   Anion gap 6 5 - 15  Urinalysis, Routine w reflex microscopic  Result Value Ref Range   Color, Urine AMBER (A) YELLOW   APPearance HAZY (A) CLEAR   Specific Gravity, Urine 1.014 1.005 - 1.030   pH 6.0 5.0 - 8.0   Glucose, UA NEGATIVE NEGATIVE mg/dL   Hgb urine dipstick NEGATIVE NEGATIVE   Bilirubin Urine NEGATIVE NEGATIVE   Ketones, ur NEGATIVE NEGATIVE mg/dL   Protein, ur NEGATIVE NEGATIVE mg/dL   Nitrite NEGATIVE NEGATIVE   Leukocytes, UA SMALL (A) NEGATIVE   RBC / HPF 6-30 0 - 5 RBC/hpf   WBC, UA 6-30 0 - 5 WBC/hpf   Bacteria, UA FEW (A) NONE SEEN   Squamous Epithelial / LPF 0-5 (A) NONE SEEN   Mucous PRESENT    Hyaline Casts, UA PRESENT    Amorphous Crystal PRESENT    Dg Chest 2 View  Result  Date: 12/03/2016 CLINICAL DATA:  Altered mental status. EXAM: CHEST  2 VIEW COMPARISON:  Radiograph of November 03, 2014. FINDINGS: Stable cardiomediastinal silhouette. No pneumothorax is noted. Probable cardiac valvular calcifications are noted. Mild left basilar atelectasis or infiltrate is noted with minimal associated pleural effusion. Right lung is clear. Bony thorax is unremarkable. IMPRESSION: Mild left basilar atelectasis or infiltrate with minimal associated pleural effusion. Electronically Signed   By: Marijo Conception, M.D.   On: 12/03/2016 14:53    EKG  EKG Interpretation None       Radiology Dg Chest 2 View  Result Date: 12/03/2016 CLINICAL DATA:  Altered mental status. EXAM: CHEST  2 VIEW COMPARISON:  Radiograph of November 03, 2014. FINDINGS: Stable cardiomediastinal silhouette. No pneumothorax is noted. Probable cardiac valvular calcifications are noted. Mild left basilar atelectasis or infiltrate is noted with minimal associated pleural effusion. Right lung is clear. Bony thorax is unremarkable. IMPRESSION: Mild left basilar atelectasis or infiltrate with minimal associated pleural effusion. Electronically Signed   By: Marijo Conception, M.D.   On: 12/03/2016 14:53    Procedures Procedures (including critical care time)  Medications Ordered in ED Medications - No data to display   Initial Impression / Assessment and Plan / ED Course  I have reviewed the triage vital signs and the nursing notes.  Pertinent labs & imaging results that were available during my care of the patient were reviewed by me and considered in my medical decision making (see chart for details).  Labs.  Reviewed nursing notes and prior charts for additional history.   Hx from family.    Ns bolus.   Possible pna on cxr.  Rocephin and zithromax po.  rx for home.    Discussed xrays w pt/son - he lives w pt.  No increased wob. Pt appears comfortable.   Patient currently appears stable for d/c.      Final  Clinical Impressions(s) / ED Diagnoses   Final diagnoses:  None    New Prescriptions New Prescriptions   No medications on file     Lajean Saver, MD 12/03/16 [id number]

## 2016-12-03 NOTE — ED Notes (Signed)
Bed: WA02 Expected date:  Expected time:  Means of arrival:  Comments: EMS elderly AMS

## 2016-12-04 DIAGNOSIS — J189 Pneumonia, unspecified organism: Secondary | ICD-10-CM | POA: Diagnosis not present

## 2016-12-04 DIAGNOSIS — R4182 Altered mental status, unspecified: Secondary | ICD-10-CM | POA: Diagnosis not present

## 2016-12-08 ENCOUNTER — Other Ambulatory Visit: Payer: Self-pay | Admitting: Geriatric Medicine

## 2016-12-08 ENCOUNTER — Ambulatory Visit
Admission: RE | Admit: 2016-12-08 | Discharge: 2016-12-08 | Disposition: A | Discharge status: Home / Self Care | Payer: Medicare Other | Source: Ambulatory Visit | Attending: Geriatric Medicine | Admitting: Geriatric Medicine

## 2016-12-08 DIAGNOSIS — J189 Pneumonia, unspecified organism: Secondary | ICD-10-CM

## 2016-12-08 DIAGNOSIS — J181 Lobar pneumonia, unspecified organism: Secondary | ICD-10-CM | POA: Diagnosis not present

## 2016-12-08 DIAGNOSIS — Z789 Other specified health status: Secondary | ICD-10-CM | POA: Diagnosis not present

## 2016-12-08 DIAGNOSIS — R0602 Shortness of breath: Secondary | ICD-10-CM | POA: Diagnosis not present

## 2016-12-08 DIAGNOSIS — E871 Hypo-osmolality and hyponatremia: Secondary | ICD-10-CM | POA: Diagnosis not present

## 2017-01-26 DIAGNOSIS — E222 Syndrome of inappropriate secretion of antidiuretic hormone: Secondary | ICD-10-CM | POA: Diagnosis not present

## 2017-01-26 DIAGNOSIS — I129 Hypertensive chronic kidney disease with stage 1 through stage 4 chronic kidney disease, or unspecified chronic kidney disease: Secondary | ICD-10-CM | POA: Diagnosis not present

## 2017-01-26 DIAGNOSIS — N183 Chronic kidney disease, stage 3 (moderate): Secondary | ICD-10-CM | POA: Diagnosis not present

## 2017-01-26 DIAGNOSIS — Z0000 Encounter for general adult medical examination without abnormal findings: Secondary | ICD-10-CM | POA: Diagnosis not present

## 2017-01-26 DIAGNOSIS — I350 Nonrheumatic aortic (valve) stenosis: Secondary | ICD-10-CM | POA: Diagnosis not present

## 2017-01-26 DIAGNOSIS — F325 Major depressive disorder, single episode, in full remission: Secondary | ICD-10-CM | POA: Diagnosis not present

## 2017-01-26 DIAGNOSIS — R410 Disorientation, unspecified: Secondary | ICD-10-CM | POA: Diagnosis not present

## 2017-01-26 DIAGNOSIS — M533 Sacrococcygeal disorders, not elsewhere classified: Secondary | ICD-10-CM | POA: Diagnosis not present

## 2017-01-26 DIAGNOSIS — Z1389 Encounter for screening for other disorder: Secondary | ICD-10-CM | POA: Diagnosis not present

## 2017-01-26 DIAGNOSIS — N390 Urinary tract infection, site not specified: Secondary | ICD-10-CM | POA: Diagnosis not present

## 2017-04-18 DIAGNOSIS — N390 Urinary tract infection, site not specified: Secondary | ICD-10-CM | POA: Diagnosis not present

## 2017-04-29 ENCOUNTER — Encounter (HOSPITAL_COMMUNITY): Payer: Self-pay

## 2017-04-29 ENCOUNTER — Emergency Department (HOSPITAL_COMMUNITY)
Admission: EM | Admit: 2017-04-29 | Discharge: 2017-04-29 | Disposition: A | Discharge status: Home / Self Care | Payer: Medicare Other | Attending: Emergency Medicine | Admitting: Emergency Medicine

## 2017-04-29 ENCOUNTER — Emergency Department (HOSPITAL_COMMUNITY): Payer: Medicare Other

## 2017-04-29 DIAGNOSIS — R229 Localized swelling, mass and lump, unspecified: Secondary | ICD-10-CM | POA: Diagnosis not present

## 2017-04-29 DIAGNOSIS — L03113 Cellulitis of right upper limb: Secondary | ICD-10-CM | POA: Diagnosis not present

## 2017-04-29 DIAGNOSIS — Z7902 Long term (current) use of antithrombotics/antiplatelets: Secondary | ICD-10-CM | POA: Insufficient documentation

## 2017-04-29 DIAGNOSIS — S63501A Unspecified sprain of right wrist, initial encounter: Secondary | ICD-10-CM | POA: Diagnosis not present

## 2017-04-29 DIAGNOSIS — Y999 Unspecified external cause status: Secondary | ICD-10-CM | POA: Diagnosis not present

## 2017-04-29 DIAGNOSIS — L03115 Cellulitis of right lower limb: Secondary | ICD-10-CM | POA: Diagnosis not present

## 2017-04-29 DIAGNOSIS — R4182 Altered mental status, unspecified: Secondary | ICD-10-CM | POA: Diagnosis not present

## 2017-04-29 DIAGNOSIS — Z91040 Latex allergy status: Secondary | ICD-10-CM | POA: Diagnosis not present

## 2017-04-29 DIAGNOSIS — M7989 Other specified soft tissue disorders: Secondary | ICD-10-CM | POA: Diagnosis not present

## 2017-04-29 DIAGNOSIS — Y33XXXA Other specified events, undetermined intent, initial encounter: Secondary | ICD-10-CM | POA: Insufficient documentation

## 2017-04-29 DIAGNOSIS — Z79899 Other long term (current) drug therapy: Secondary | ICD-10-CM | POA: Diagnosis not present

## 2017-04-29 DIAGNOSIS — I10 Essential (primary) hypertension: Secondary | ICD-10-CM | POA: Diagnosis not present

## 2017-04-29 DIAGNOSIS — S6991XA Unspecified injury of right wrist, hand and finger(s), initial encounter: Secondary | ICD-10-CM | POA: Diagnosis present

## 2017-04-29 DIAGNOSIS — Y929 Unspecified place or not applicable: Secondary | ICD-10-CM | POA: Diagnosis not present

## 2017-04-29 DIAGNOSIS — R509 Fever, unspecified: Secondary | ICD-10-CM | POA: Diagnosis not present

## 2017-04-29 DIAGNOSIS — Y939 Activity, unspecified: Secondary | ICD-10-CM | POA: Insufficient documentation

## 2017-04-29 DIAGNOSIS — L0390 Cellulitis, unspecified: Secondary | ICD-10-CM | POA: Diagnosis not present

## 2017-04-29 DIAGNOSIS — E039 Hypothyroidism, unspecified: Secondary | ICD-10-CM | POA: Diagnosis not present

## 2017-04-29 MED ORDER — CEPHALEXIN 500 MG PO CAPS: 500.0000 mg | ORAL_CAPSULE | Freq: Four times a day (QID) | ORAL | 0 refills | Status: DC

## 2017-04-29 MED ORDER — FENTANYL CITRATE (PF) 100 MCG/2ML IJ SOLN
50.0000 ug | Freq: Once | INTRAMUSCULAR | Status: DC
  Filled 2017-04-29: qty 2

## 2017-04-29 MED ORDER — CEPHALEXIN 500 MG PO CAPS
500.0000 mg | ORAL_CAPSULE | Freq: Once | ORAL | Status: AC
  Administered 2017-04-29: 500 mg via ORAL
  Filled 2017-04-29: qty 1

## 2017-04-29 MED ORDER — FENTANYL CITRATE (PF) 100 MCG/2ML IJ SOLN
50.0000 ug | Freq: Once | INTRAMUSCULAR | Status: AC
  Administered 2017-04-29: 50 ug via INTRAMUSCULAR

## 2017-04-29 NOTE — ED Notes (Signed)
Patient transported to X-ray 

## 2017-04-29 NOTE — ED Notes (Signed)
[facility] called for transport.  

## 2017-04-29 NOTE — ED Provider Notes (Signed)
[facility] DEPT Provider Note   CSN: [id number] Arrival date & time: 04/29/17  4696     History   Chief Complaint Chief Complaint  Patient presents with  . Hand Swelling    R    HPI Jacqueline Robles is a 83 y.o. female who presents with right hand pain and swelling that began yesterday. Symptoms have progressively worsened, prompting ED visit. Patient reports that pain is worsened with movement of the hand or fingers but states that she still is able to minimally move them. Son reports that he has noticed some redness/bruising to the hand. Denies any warmth. Patient's son is her primary caretaker denies any recent falls or injury but does state that he is not with her throat the entire day. Patient does use a walker to ambulate. Patient has taken Tylenol with minimal improvement in symptoms, her last dose was at 5:30 PM. Son reports that patient had a mild fever of 100.4 home but none since. Patient does not have a history of gout. Patient denies any recent injuries,  numbness/weakness, shoulder, elbow pain.   The history is provided by the patient and a relative.    Past Medical History:  Diagnosis Date  . C. difficile colitis   . Depression   . Hyperlipidemia   . Hypertension   . Osteoporosis   . Thyroid disease   . UTI (lower urinary tract infection)     Patient Active Problem List   Diagnosis Date Noted  . Encounter for family conference with patient present 12/04/2014  . Candidal diaper dermatitis 12/04/2014  . Agitation 11/10/2014  . C. difficile colitis 11/04/2014  . Fever 11/03/2014  . Aortic stenosis 11/01/2014  . Hypothyroidism 11/01/2014  . Hyperlipidemia 11/01/2014  . UTI (lower urinary tract infection) 11/01/2014  . Sacral insufficiency fracture 11/01/2014  . Sacral fracture, closed ([place]) 10/31/2014    Past Surgical History:  Procedure Laterality Date  . FOOT SURGERY    . neck surgery      OB History    No data available       Home  Medications    Prior to Admission medications   Medication Sig Start Date End Date Taking? Authorizing Provider  acetaminophen (TYLENOL) 500 MG tablet Take 500-1,000 mg by mouth every 6 (six) hours as needed for moderate pain.   Yes [provider]  Calcium Carb-Cholecalciferol (CALCIUM 600 + D) 600-200 MG-UNIT TABS Take by mouth.   Yes [provider]  cholecalciferol (VITAMIN D) 1000 units tablet Take 1,000 Units by mouth daily.   Yes [provider]  clopidogrel (PLAVIX) 75 MG tablet Take 75 mg by mouth daily.   Yes [provider]  fexofenadine (ALLEGRA) 180 MG tablet Take 180 mg by mouth daily.   Yes [provider]  levothyroxine (SYNTHROID, LEVOTHROID) 75 MCG tablet Take 75 mcg by mouth daily before breakfast.   Yes [provider]  Melatonin 10 MG CAPS Take 10 mg by mouth at bedtime.   Yes [provider]  montelukast (SINGULAIR) 10 MG tablet Take 10 mg by mouth at bedtime.   Yes [provider]  Multiple Vitamins-Minerals (MULTIVITAMIN WITH MINERALS) tablet Take 1 tablet by mouth daily.   Yes [provider]  Multiple Vitamins-Minerals (PRESERVISION AREDS 2 PO) Take 2 capsules by mouth 2 (two) times daily.   Yes [provider]  Olopatadine HCl (PATADAY) 0.2 % SOLN Place 1 drop into both eyes daily.   Yes [provider]  sertraline (ZOLOFT)  25 MG tablet Take 25 mg by mouth daily.   Yes [provider]  cefdinir (OMNICEF) 300 MG capsule Take 1 capsule (300 mg total) by mouth 2 (two) times daily. Patient not taking: Reported on 04/29/2017 12/03/16   Lajean Saver, MD  cephALEXin (KEFLEX) 500 MG capsule Take 1 capsule (500 mg total) by mouth 4 (four) times daily. 04/29/17   Volanda Napoleon, PA-C  HYDROcodone-acetaminophen (NORCO/VICODIN) 5-325 MG tablet One half tablet by mouth every 6 hours for pain. He had some sedation effects. Patient not taking: Reported on 04/29/2017 03/14/16    Tanna Furry, MD  LORazepam (ATIVAN) 0.5 MG tablet Take 1 tablet (0.5 mg total) by mouth every 8 (eight) hours as needed for anxiety. Patient not taking: Reported on 03/14/2016 11/05/14   Orson Eva, MD    Family History Family History  Problem Relation Age of Onset  . Hypertension Son     Social History Social History  Substance Use Topics  . Smoking status: Never Smoker  . Smokeless tobacco: Not on file  . Alcohol use No     Allergies   Fosamax [alendronate sodium]; Hctz [hydrochlorothiazide]; Latex; Neosporin [neomycin-bacitracin zn-polymyx]; Norvasc [amlodipine besylate]; Bacitracin-neomycin-polymyxin; Lanolin; Naproxen; and Polysporin [bacitracin-polymyxin b]   Review of Systems Review of Systems  Constitutional: Positive for fever.  Musculoskeletal:       Right hand pain and swelling  Skin: Positive for color change.  Neurological: Negative for weakness and numbness.     Physical Exam Updated Vital Signs BP 134/66   Pulse 60   Temp 99.8 F (37.7 C) (Oral)   Resp 18   SpO2 92%   Physical Exam  Constitutional: She appears well-developed and well-nourished.  Elderly and frail-appearing  HENT:  Head: Normocephalic and atraumatic.  Eyes: Conjunctivae and EOM are normal. Right eye exhibits no discharge. Left eye exhibits no discharge. No scleral icterus.  Cardiovascular:  Pulses:      Radial pulses are 2+ on the right side, and 2+ on the left side.  Pulmonary/Chest: Effort normal.  Musculoskeletal:  Tenderness palpation to the right wrist and dorsal hand with mild overlying soft tissue swelling and redness. Ecchymosis that extends over the 2-3 carpal. Notable arthritic deformities on bilateral hands that appear symmetric. Flexion/extension of digits 1-5 are intact. Patient is able to make a fist with all digits, though she has more difficulty moving her right thumb.Mild tenderness to palpation to the right forearm. No tenderness to the bilateral elbows, shoulders,  clavicles. Flexion/extension of shoulders intact bilaterally.   Neurological: She is alert.  Sensation intact along major nerve distributions of the hands  Skin: Skin is warm and dry. Capillary refill takes less than 2 seconds.  Mild warmth, ecchymosis and erythema overlying the dorsal aspect of the right hand.   Psychiatric: She has a normal mood and affect. Her speech is normal and behavior is normal.  Nursing note and vitals reviewed.    ED Treatments / Results  Labs (all labs ordered are listed, but only abnormal results are displayed) Labs Reviewed - No data to display  EKG  EKG Interpretation None       Radiology Dg Forearm Right  Result Date: 04/29/2017 CLINICAL DATA:  Soft tissue swelling of the hand. Wrist pain. Pain with movement of the digits. Symptoms began yesterday with progression. EXAM: RIGHT FOREARM - 2 VIEW COMPARISON:  Right wrist radiographs of the same day. FINDINGS: Marked soft tissue swelling is present of the wrist and proximal hand. No acute osseous  abnormality is present. Moderate osteopenia is present. Vascular calcifications are noted. The elbow is unremarkable. IMPRESSION: 1. Marked soft tissue swelling at the wrist and proximal hand without acute osseous abnormality. This may related to cellulitis or soft tissue injury. 2. Moderate osteopenia. 3. Atherosclerosis. Electronically Signed   By: San Morelle M.D.   On: 04/29/2017 19:57   Dg Wrist Complete Right  Result Date: 04/29/2017 CLINICAL DATA:  Swelling of the hand and wrist began yesterday with progression. Low-grade fever. Pain with movement of the digits. EXAM: RIGHT WRIST - COMPLETE 3+ VIEW COMPARISON:  None. FINDINGS: Moderate osteopenia is present. There is marked soft tissue swelling about the wrist min extending into the proximal hand. Marked subluxation is present at the first [facility] joint compatible with chronic degeneration. Chondrocalcinosis is present in the TFCC. No acute osseous  abnormality is present. IMPRESSION: 1. Marked soft tissue swelling about the wrist without underlying fracture. This may be related cellulitis or soft tissue injury. 2. Marked osteopenia. 3. Advanced degenerative changes at the first [facility] joint. 4. No acute osseous abnormality. Electronically Signed   By: San Morelle M.D.   On: 04/29/2017 19:56    Procedures Procedures (including critical care time)  Medications Ordered in ED Medications  fentaNYL (SUBLIMAZE) injection 50 mcg (50 mcg Intramuscular Given 04/29/17 2009)  cephALEXin (KEFLEX) capsule 500 mg (500 mg Oral Given 04/29/17 2106)     Initial Impression / Assessment and Plan / ED Course  I have reviewed the triage vital signs and the nursing notes.  Pertinent labs & imaging results that were available during my care of the patient were reviewed by me and considered in my medical decision making (see chart for details).     And 83 year old female who presents with 1 day of right hand pain and swelling. Mild fever this morning but none since. Patient is afebrile, non-toxic appearing, sitting comfortably on examination table. Vital signs reviewed and stable. Consider fracture versus dislocation vs cellulitis. Also consider ligament sprain, such as gamekeeper's thumb. Patient does use a walker to ambulate where the grips the handle. If patient tripped and grabbed onto the walker, could have potentially sustained a ligament sprain. History/physical exam are not consistent with septic arthritis. Doubt gout as patient has no history of it and review of medications show no increased risk. Will obtain x-ray imaging of the wrist and hand and forearm for further evaluation.  XR reviewed. There is mention of age related degenerative changes. Negative for any acute fracture or dislocation. Given warmth and erythema, will plan to treat as early cellulitis. Will also treat as sprain. Discussed patient with Dr. Ashok Cordia. Agrees with plan. Discussed  results with patient and her son. Will plan to start patient on antibiotics for cellulitis coverage. Splint provided for support and stabilization. Instructed patient's son to follow-up with referred orthopedic doctor if symptoms do not improve. Conservative therapies discussed with son. Instructed patient to follow-up with PCP in 2 days. Strict return precautions discussed. Patient expresses understanding and agreement to plan.    Final Clinical Impressions(s) / ED Diagnoses   Final diagnoses:  Wrist sprain, right, initial encounter  Cellulitis of right upper extremity    New Prescriptions Discharge Medication List as of 04/29/2017  9:27 PM    START taking these medications   Details  cephALEXin (KEFLEX) 500 MG capsule Take 1 capsule (500 mg total) by mouth 4 (four) times daily., Starting Fri 04/29/2017, Print         Volanda Napoleon, PA-C  04/30/17 1100    Lajean Saver, MD 04/30/17 1505

## 2017-04-29 NOTE — ED Triage Notes (Addendum)
Pt BIB [facility] from home c/o R hand swelling that started yesterday and has continued. She reports a low grade fever at home. She is able to move her fingers, but reports pain when she does. Cap refill <2 secs, pulses noted. A&Ox4.

## 2017-04-29 NOTE — Discharge Instructions (Signed)
Take antibiotics as directed. Please take all of your antibiotics until finished.  Take tylenol as needed for pain.   As we discussed, apply ice to the affected area.   Wear the splint for support and stabilization.  There is on any improvement in symptoms in the next week, follow-up with referred orthopedic doctor for further evaluation.  Return the emergency Department for any worsening pain, worsening redness/swelling that extends up the hand, fever, numbness/weakness of the hand or any other worsening or concerning symptoms.

## 2017-05-26 ENCOUNTER — Non-Acute Institutional Stay (SKILLED_NURSING_FACILITY): Payer: Medicare Other | Admitting: Internal Medicine

## 2017-05-26 ENCOUNTER — Encounter: Payer: Self-pay | Admitting: Internal Medicine

## 2017-05-26 DIAGNOSIS — F0150 Vascular dementia without behavioral disturbance: Secondary | ICD-10-CM

## 2017-05-26 DIAGNOSIS — R296 Repeated falls: Secondary | ICD-10-CM | POA: Diagnosis not present

## 2017-05-26 DIAGNOSIS — E785 Hyperlipidemia, unspecified: Secondary | ICD-10-CM | POA: Diagnosis not present

## 2017-05-26 DIAGNOSIS — E034 Atrophy of thyroid (acquired): Secondary | ICD-10-CM

## 2017-05-26 DIAGNOSIS — R488 Other symbolic dysfunctions: Secondary | ICD-10-CM | POA: Diagnosis not present

## 2017-05-26 DIAGNOSIS — I129 Hypertensive chronic kidney disease with stage 1 through stage 4 chronic kidney disease, or unspecified chronic kidney disease: Secondary | ICD-10-CM | POA: Diagnosis not present

## 2017-05-26 DIAGNOSIS — R1311 Dysphagia, oral phase: Secondary | ICD-10-CM | POA: Diagnosis not present

## 2017-05-26 DIAGNOSIS — R627 Adult failure to thrive: Secondary | ICD-10-CM

## 2017-05-26 DIAGNOSIS — M6281 Muscle weakness (generalized): Secondary | ICD-10-CM | POA: Diagnosis not present

## 2017-05-26 NOTE — Progress Notes (Signed)
NURSING HOME LOCATION:  [facility] ROOM NUMBER:  [id number]  CODE STATUS:  DNR  PCP:  Lajean Manes, MD  [address] [place] [address]   This is a comprehensive admission note to [facility] performed on this date less than 30 days from date of admission. Included are preadmission medical/surgical history;reconciled medication list; family history; social history and comprehensive review of systems.  Corrections and additions to the records were documented . Comprehensive physical exam was also performed. Additionally a clinical summary was entered for each active diagnosis pertinent to this admission in the Problem List to enhance continuity of care.  HPI: The only records provided was an FL-2 form listing diagnoses as hypertension, stage III kidney disease, aortic stenosis, hypercholesterolemia, hypothyroidism, and SIADH. That note indicates the patient is constantly disoriented but no inappropriate or dysfunctional behaviors listed. Total care was felt to be needed. The patient is described as incontinent of bladder bladder and bowel.   The patient was seen in the emergency room 04/29/17 for pain in the right hand with associated swelling;and pain with any motion. No injury or predisposition had been noted. The morning of the visit the patient had low-grade fever. Imaging revealed marked soft tissue swelling of the wrist and proximal hand. Moderate osteopenia was present without other osseous changes. The patient was felt to have a wrist sprain with possible cellulitis. Oral Keflex was prescribed.  The most recent labs in [place] were 12/03/16. Sodium was 131, glucose 149, creatinine 1.19, calcium 8.6, albumin 3.1, AST 44, ALT 13, total bilirubin 2.0, GFR 38, hemoglobin 11.5/hematocrit 33.5 with normochromic, normocytic indices. Last TSH on record was in 2016. She is on 75 g of L-thyroxine daily.  Past medical and surgical history also includes osteoporosis,  depression, and C. difficile colitis. Surgeries include neck and foot surgery.  Social history: Never smoked, never drank.  Family history: Limited data reviewed.  Review of systems: Thankfully her son-in-law was present and provided additional history. The patient repeats each question when asked. The patient was unable to identify him by name. At first she could not tell me who he was. Later she identified him as her son- in- law but still could not name him. Also could not  provide  date or the name of the president.  She's become more nonambulatory over the last 3 months without specific cause. She was being attended at home by [facility] 3 days a week. Otherwise she was essentially in bed as her caregiver was her son who is also debilitated.Previously she had been ambulating with a walker and one-person support using a support waist belt. She has had recurrent urinary tract infections. He states that she does have some vascular dementia in the context of history of mini strokes and now has 24/ 7 care needs. He states she has been able to feed herself and brush teeth but could conduct no other activities of daily living. She's been unable to stand or go to the bathroom by herself and has had recurrent falls.  Constitutional: No fever,significant weight change  Cardiovascular: No chest pain, palpitations,paroxysmal nocturnal dyspnea, claudication, edema  Respiratory: No cough, sputum production,hemoptysis, DOE , significant snoring,apnea  Gastrointestinal: No heartburn,dysphagia,abdominal pain, nausea / vomiting,rectal bleeding, melena,change in bowels Genitourinary: No dysuria,hematuria, pyuria,  incontinence, nocturia Dermatologic: No rash, pruritus, change in appearance of skin Neurologic: No dizziness,headache,syncope, seizures, numbness , tingling Psychiatric: No significant anxiety , depression, insomnia (on Melatonin), anorexia Endocrine: No change in hair/skin/ nails,  excessive  thirst, excessive hunger, excessive urination  Hematologic/lymphatic: No significant bruising, lymphadenopathy,abnormal bleeding Allergy/immunology: No itchy/ watery eyes, significant sneezing, urticaria, angioedema  Physical exam:  Pertinent or positive findings: She appears frail and her stated age. Initially she was sleeping. She was somewhat difficult to arouse. She is hard of hearing. Eyebrows are absent. Lower lids are puffy. There is a faint bruise over the left lateral malar area. She has a grade 2 systolic murmur with radiation into the carotids. Breath sounds are decreased. She has slight scattered rales. She has 1+ edema at the sock line. She is generally symmetrically weak to opposition.  Fusiform change in the right wrist present.  "Rocker bottom" character to the left foot. She has marked interosseous wasting. There is a minor flexion contracture of the fifth right digit. This is accentuation of the thoracic curvature  General appearance:Adequately nourished; no acute distress , increased work of breathing is present.   Lymphatic: No lymphadenopathy about the head, neck, axilla . Eyes: No conjunctival inflammation or lid edema is present. There is no scleral icterus. Ears:  External ear exam shows no significant lesions or deformities.   Nose:  External nasal examination shows no deformity or inflammation. Nasal mucosa are pink and moist without lesions ,exudates Oral exam: lips and gums are healthy appearing.There is no oropharyngeal erythema or exudate . Neck:  No thyromegaly, masses, tenderness noted.    Heart:  Normal rate and regular rhythm. S1 and S2 normal without gallop,click, rub .  Abdomen:Bowel sounds are normal. Abdomen is soft and nontender with no organomegaly, hernias,masses. GU: deferred  Extremities:  No cyanosis, clubbing  Neurologic exam : Balance,Rhomberg,finger to nose testing could not be completed due to clinical state Skin: Warm & dry w/o tenting. No  significant lesions or rash.  See clinical summary under each active problem in the Problem List with associated updated therapeutic plan

## 2017-05-26 NOTE — Assessment & Plan Note (Signed)
Son-in-law states that statin has been discontinued because of age

## 2017-05-26 NOTE — Assessment & Plan Note (Signed)
Check TSH 

## 2017-05-26 NOTE — Assessment & Plan Note (Signed)
PT OT assessment 

## 2017-05-26 NOTE — Assessment & Plan Note (Signed)
Assess response to PT/OT  nutrition consult

## 2017-05-27 ENCOUNTER — Other Ambulatory Visit: Payer: Self-pay | Admitting: *Deleted

## 2017-05-27 DIAGNOSIS — F0150 Vascular dementia without behavioral disturbance: Secondary | ICD-10-CM | POA: Insufficient documentation

## 2017-05-27 DIAGNOSIS — R1311 Dysphagia, oral phase: Secondary | ICD-10-CM | POA: Diagnosis not present

## 2017-05-27 DIAGNOSIS — M6281 Muscle weakness (generalized): Secondary | ICD-10-CM | POA: Diagnosis not present

## 2017-05-27 DIAGNOSIS — R488 Other symbolic dysfunctions: Secondary | ICD-10-CM | POA: Diagnosis not present

## 2017-05-27 DIAGNOSIS — I129 Hypertensive chronic kidney disease with stage 1 through stage 4 chronic kidney disease, or unspecified chronic kidney disease: Secondary | ICD-10-CM | POA: Diagnosis not present

## 2017-05-27 NOTE — Patient Instructions (Signed)
See assessment and plan under each diagnosis in the problem list and acutely for this visit 

## 2017-05-27 NOTE — Assessment & Plan Note (Signed)
BP controlled; no indication for antihypertensive medications  

## 2017-05-27 NOTE — Patient Outreach (Signed)
   [facility]) Care Management Post-Acute Care Coordination  05/27/2017  Jacqueline Robles 11/06/1921 [id number]   Met with Jacqueline Robles, SW for [place]. Reviewed patient case. She confirms that patient is a [place] resident of facility and there are no plans to discharge at this time.  RNCM will sign off case.  Royetta Crochet. Laymond Purser, RN, BSN, [facility] [place] Post-Acute Care Coordinator [phone]

## 2017-05-30 DIAGNOSIS — M6281 Muscle weakness (generalized): Secondary | ICD-10-CM | POA: Diagnosis not present

## 2017-05-30 DIAGNOSIS — M25531 Pain in right wrist: Secondary | ICD-10-CM | POA: Diagnosis not present

## 2017-05-30 DIAGNOSIS — N183 Chronic kidney disease, stage 3 (moderate): Secondary | ICD-10-CM | POA: Diagnosis not present

## 2017-05-31 ENCOUNTER — Non-Acute Institutional Stay (SKILLED_NURSING_FACILITY): Payer: Medicare Other | Admitting: Internal Medicine

## 2017-05-31 ENCOUNTER — Encounter: Payer: Self-pay | Admitting: Internal Medicine

## 2017-05-31 DIAGNOSIS — M25531 Pain in right wrist: Secondary | ICD-10-CM

## 2017-05-31 DIAGNOSIS — M6281 Muscle weakness (generalized): Secondary | ICD-10-CM | POA: Diagnosis not present

## 2017-05-31 DIAGNOSIS — N183 Chronic kidney disease, stage 3 (moderate): Secondary | ICD-10-CM | POA: Diagnosis not present

## 2017-05-31 DIAGNOSIS — F0150 Vascular dementia without behavioral disturbance: Secondary | ICD-10-CM | POA: Diagnosis not present

## 2017-05-31 NOTE — Progress Notes (Signed)
    NURSING HOME LOCATION:  [facility] ROOM NUMBER:  [id number]  CODE STATUS:  DNR  PCP:  Lajean Manes, MD  [address] [place] [address]  This is a nursing facility follow up for specific acute issue of swelling of the right hand  Interim medical record and care since last [place] visit was updated with review of diagnostic studies and change in clinical status since last visit were documented.  HPI: As previously documented the patient was treated @ the ED 04/29/17  for presumed cellulitis of the right wrist with oral antibiotics. Her son is presently concerned about possible recurrent pain @ the same site. He feels she will forget to ask for pain medication. He asks about maintenance Tylenol every 6 hours a regular basis.  Review of systems: Dementia invalidated responses. The patient identified her son  but not by name. She cannot give me his date of birth. No fever or redness @ wrist reported by staff.  Physical exam:  Pertinent or positive findings:  Initially the wrist was in a brace which was removed for exam. There is fusiform change of the right wrist without suggestion clinically of any effusion. There is no change in temperature of the wrist. There is no change in color. She answered variably as to whether she had pain with range of motion. There was decreased extension of the wrist.  At times she validated pain with  range of motion in any direction but would then deny pain.  The same response was elicited in the left wrist with ROM testing. She does have a ganglion cyst over the dorsum of the right wrist.  General appearance:Adequately nourished; no acute distress , increased work of breathing is present.  Alert but disoriented. HOH. No cyanosis, clubbing,edema . Interosseous hand wasting. Skin: Warm & dry w/o tenting. No significant lesions or rash.  See summary under each acute or active diagnosis with associated updated therapeutic  plan

## 2017-05-31 NOTE — Patient Instructions (Signed)
See assessment and plan under each diagnosis in the problem list and acutely for this visit 

## 2017-05-31 NOTE — Assessment & Plan Note (Addendum)
See 05/31/17 her responses are variable and unreliable as to pain in the right wrist with ROM tseting Same finding on L also

## 2017-06-01 ENCOUNTER — Encounter: Payer: Self-pay | Admitting: Internal Medicine

## 2017-06-01 DIAGNOSIS — M6281 Muscle weakness (generalized): Secondary | ICD-10-CM | POA: Diagnosis not present

## 2017-06-01 DIAGNOSIS — M25531 Pain in right wrist: Secondary | ICD-10-CM | POA: Diagnosis not present

## 2017-06-01 DIAGNOSIS — N183 Chronic kidney disease, stage 3 (moderate): Secondary | ICD-10-CM | POA: Diagnosis not present

## 2017-06-02 DIAGNOSIS — N183 Chronic kidney disease, stage 3 (moderate): Secondary | ICD-10-CM | POA: Diagnosis not present

## 2017-06-02 DIAGNOSIS — M25531 Pain in right wrist: Secondary | ICD-10-CM | POA: Diagnosis not present

## 2017-06-02 DIAGNOSIS — M6281 Muscle weakness (generalized): Secondary | ICD-10-CM | POA: Diagnosis not present

## 2017-06-03 DIAGNOSIS — M6281 Muscle weakness (generalized): Secondary | ICD-10-CM | POA: Diagnosis not present

## 2017-06-03 DIAGNOSIS — M25531 Pain in right wrist: Secondary | ICD-10-CM | POA: Diagnosis not present

## 2017-06-03 DIAGNOSIS — N183 Chronic kidney disease, stage 3 (moderate): Secondary | ICD-10-CM | POA: Diagnosis not present

## 2017-06-06 DIAGNOSIS — M25531 Pain in right wrist: Secondary | ICD-10-CM | POA: Diagnosis not present

## 2017-06-06 DIAGNOSIS — M6281 Muscle weakness (generalized): Secondary | ICD-10-CM | POA: Diagnosis not present

## 2017-06-06 DIAGNOSIS — N183 Chronic kidney disease, stage 3 (moderate): Secondary | ICD-10-CM | POA: Diagnosis not present

## 2017-06-07 DIAGNOSIS — M6281 Muscle weakness (generalized): Secondary | ICD-10-CM | POA: Diagnosis not present

## 2017-06-07 DIAGNOSIS — M25531 Pain in right wrist: Secondary | ICD-10-CM | POA: Diagnosis not present

## 2017-06-07 DIAGNOSIS — N183 Chronic kidney disease, stage 3 (moderate): Secondary | ICD-10-CM | POA: Diagnosis not present

## 2017-06-08 DIAGNOSIS — M6281 Muscle weakness (generalized): Secondary | ICD-10-CM | POA: Diagnosis not present

## 2017-06-08 DIAGNOSIS — M25531 Pain in right wrist: Secondary | ICD-10-CM | POA: Diagnosis not present

## 2017-06-08 DIAGNOSIS — N183 Chronic kidney disease, stage 3 (moderate): Secondary | ICD-10-CM | POA: Diagnosis not present

## 2017-06-09 DIAGNOSIS — N183 Chronic kidney disease, stage 3 (moderate): Secondary | ICD-10-CM | POA: Diagnosis not present

## 2017-06-09 DIAGNOSIS — M6281 Muscle weakness (generalized): Secondary | ICD-10-CM | POA: Diagnosis not present

## 2017-06-09 DIAGNOSIS — M25531 Pain in right wrist: Secondary | ICD-10-CM | POA: Diagnosis not present

## 2017-06-10 DIAGNOSIS — M25531 Pain in right wrist: Secondary | ICD-10-CM | POA: Diagnosis not present

## 2017-06-10 DIAGNOSIS — M6281 Muscle weakness (generalized): Secondary | ICD-10-CM | POA: Diagnosis not present

## 2017-06-10 DIAGNOSIS — N183 Chronic kidney disease, stage 3 (moderate): Secondary | ICD-10-CM | POA: Diagnosis not present

## 2017-06-13 DIAGNOSIS — N183 Chronic kidney disease, stage 3 (moderate): Secondary | ICD-10-CM | POA: Diagnosis not present

## 2017-06-13 DIAGNOSIS — M6281 Muscle weakness (generalized): Secondary | ICD-10-CM | POA: Diagnosis not present

## 2017-06-13 DIAGNOSIS — M25531 Pain in right wrist: Secondary | ICD-10-CM | POA: Diagnosis not present

## 2017-06-14 DIAGNOSIS — N183 Chronic kidney disease, stage 3 (moderate): Secondary | ICD-10-CM | POA: Diagnosis not present

## 2017-06-14 DIAGNOSIS — M25531 Pain in right wrist: Secondary | ICD-10-CM | POA: Diagnosis not present

## 2017-06-14 DIAGNOSIS — M6281 Muscle weakness (generalized): Secondary | ICD-10-CM | POA: Diagnosis not present

## 2017-06-15 DIAGNOSIS — M25531 Pain in right wrist: Secondary | ICD-10-CM | POA: Diagnosis not present

## 2017-06-15 DIAGNOSIS — N183 Chronic kidney disease, stage 3 (moderate): Secondary | ICD-10-CM | POA: Diagnosis not present

## 2017-06-15 DIAGNOSIS — M6281 Muscle weakness (generalized): Secondary | ICD-10-CM | POA: Diagnosis not present

## 2017-06-16 DIAGNOSIS — M6281 Muscle weakness (generalized): Secondary | ICD-10-CM | POA: Diagnosis not present

## 2017-06-16 DIAGNOSIS — M25531 Pain in right wrist: Secondary | ICD-10-CM | POA: Diagnosis not present

## 2017-06-16 DIAGNOSIS — N183 Chronic kidney disease, stage 3 (moderate): Secondary | ICD-10-CM | POA: Diagnosis not present

## 2017-06-21 ENCOUNTER — Encounter: Payer: Self-pay | Admitting: Adult Health

## 2017-06-21 ENCOUNTER — Non-Acute Institutional Stay (SKILLED_NURSING_FACILITY): Payer: Medicare Other | Admitting: Adult Health

## 2017-06-21 DIAGNOSIS — H1013 Acute atopic conjunctivitis, bilateral: Secondary | ICD-10-CM | POA: Diagnosis not present

## 2017-06-21 NOTE — Progress Notes (Signed)
DATE:  06/21/2017   MRN:  [id number]  BIRTHDAY: [birth year]  Facility:  Nursing Home Location:  [facility] Room Number: 118-B  LEVEL OF CARE:  SNF (31)  Contact Information    Name Relation Home Work Mobile   [place] Daughter [phone]  [phone]   Jinnifer, Montejano [phone]  [phone]   Stoney Bang    [phone]       Code Status History    Date Active Date Inactive Code Status Order ID Comments User Context   12/04/2014  9:04 PM 12/03/2016  1:18 PM DNR [id number]  Hennie Duos, MD Outpatient   11/01/2014 12:40 AM 11/05/2014  6:42 PM DNR [id number]  Rise Patience, MD Inpatient    Questions for Most Recent Historical Code Status (Order [id number])    Question Answer Comment   In the event of cardiac or respiratory ARREST Do not call a "code blue"    In the event of cardiac or respiratory ARREST Do not perform Intubation, CPR, defibrillation or ACLS    In the event of cardiac or respiratory ARREST Use medication by any route, position, wound care, and other measures to relive pain and suffering. May use oxygen, suction and manual treatment of airway obstruction as needed for comfort.         Advance Directive Documentation     Most Recent Value  Type of Advance Directive  Out of facility DNR (pink MOST or yellow form)  Pre-existing out of facility DNR order (yellow form or pink MOST form)  -  "MOST" Form in Place?  -       Chief Complaint  Patient presents with  . Acute Visit    Red, itchy eyes    HISTORY OF PRESENT ILLNESS:  This is a 39-YO female seen for an acute visit due to red, itchy eyes.  She is a long-term care resident of [facility].  She has a PMH of depression, HLD, HTN, OP, thyroid disease, and C. difficile colitis. She was seen in her room today and noted right eye with slight yellowish crusting on the right eye. No complaints of eye pain. Noted patient has been scratching both  eyes which causes both eyelids to have redness. Talked to son who was visiting yesterday. He is concerned that she might be having allergy. Son-in-law, who is a Software engineer,  called and inquired if the patient can be put on eye drops.      PAST MEDICAL HISTORY:  Past Medical History:  Diagnosis Date  . C. difficile colitis   . Depression   . Hyperlipidemia   . Hypertension   . Osteoporosis   . Thyroid disease   . UTI (lower urinary tract infection)      CURRENT MEDICATIONS: Reviewed  Patient's Medications  New Prescriptions   No medications on file  Previous Medications   ACETAMINOPHEN (TYLENOL) 500 MG TABLET    Take 500 mg by mouth every 6 (six) hours as needed for moderate pain.    CALCIUM CARB-CHOLECALCIFEROL (CALCIUM 600 + D) 600-200 MG-UNIT TABS    Take by mouth.   CHOLECALCIFEROL (VITAMIN D) 1000 UNITS TABLET    Take 1,000 Units by mouth daily.   CLOPIDOGREL (PLAVIX) 75 MG TABLET    Take 75 mg by mouth daily.   FEXOFENADINE (ALLEGRA) 180 MG TABLET    Take 180 mg by mouth daily.   FLUTICASONE (FLONASE) 50 MCG/ACT NASAL SPRAY    Place 2 sprays into both  nostrils daily.   LEVOTHYROXINE (SYNTHROID, LEVOTHROID) 75 MCG TABLET    Take 75 mcg by mouth daily before breakfast.   MELATONIN 10 MG CAPS    Take 10 mg by mouth at bedtime.   MULTIPLE VITAMINS-MINERALS (MULTIVITAMIN WITH MINERALS) TABLET    Take 1 tablet by mouth daily.   MULTIPLE VITAMINS-MINERALS (PRESERVISION AREDS 2 PO)    Take 2 capsules by mouth 2 (two) times daily.    NUTRITIONAL SUPPLEMENT LIQD    Take 120 mLs by mouth 2 (two) times daily. MedPass   SERTRALINE (ZOLOFT) 25 MG TABLET    Take 25 mg by mouth daily.  Modified Medications   No medications on file  Discontinued Medications   No medications on file     Allergies  Allergen Reactions  . Fosamax [Alendronate Sodium] Other (See Comments)    Unknown, family is stating there is no allergic/adverse reaction to drug it was just discontinued.  [name] Hctz  [Hydrochlorothiazide] Other (See Comments)    unknownUnknown, family is stating there is no allergic/adverse reaction to drug it was just discontinued.  . Latex     Unknown, family is stating there is no allergic/adverse reaction to drug it was just discontinued.  . Neosporin [Neomycin-Bacitracin Zn-Polymyx]   . Norvasc [Amlodipine Besylate]     Unknown, family is stating there is no allergic/adverse reaction to drug it was just discontinued.  . Bacitracin-Neomycin-Polymyxin Rash  . Lanolin Rash  . Naproxen Rash  . Polysporin [Bacitracin-Polymyxin B] Rash    rash     REVIEW OF SYSTEMS:  Unable to obtain due to dementia    PHYSICAL EXAMINATION  GENERAL APPEARANCE: Well nourished. In no acute distress. Normal body habitus SKIN:  Skin is warm and dry.  EYES: bilateral upper eyelids are reddish, right eye eye noted to have mild yellowish crusting MOUTH and THROAT: Lips are without lesions. Oral mucosa is moist and without lesions. RESPIRATORY: breathing is even & unlabored, BS CTAB CARDIAC: RRR, + murmur,no extra heart sounds, no edema GI: abdomen soft, normal BS, no masses, no tenderness, no hepatomegaly, no splenomegaly EXTREMITIES: Able to move X 4 extremities PSYCHIATRIC: Alert to self, disoriented. Aff to time and placeect and behavior are appropriate   LABS/RADIOLOGY: Labs reviewed: Basic Metabolic Panel:  Recent Labs  12/03/16 1418  NA 131*  K 4.8  CL 102  CO2 23  GLUCOSE 149*  BUN 12  CREATININE 1.19*  CALCIUM 8.6*   Liver Function Tests:  Recent Labs  12/03/16 1418  AST 44*  ALT 13*  ALKPHOS 51  BILITOT 2.0*  PROT 7.0  ALBUMIN 3.1*   CBC:  Recent Labs  12/03/16 1418  WBC 10.4  HGB 11.5*  HCT 33.5*  MCV 91.8  PLT 202    ASSESSMENT/PLAN:  1. Allergic conjunctivitis of both eyes - will start Patanol 0.1% opthalmic solution 1 gtt to each eye daily X 2 weeks, keeps eyes clean     Hildur Bayer C. [place] - NP    [employer] [phone]

## 2017-07-01 ENCOUNTER — Non-Acute Institutional Stay (SKILLED_NURSING_FACILITY): Payer: Medicare Other | Admitting: Adult Health

## 2017-07-01 ENCOUNTER — Encounter: Payer: Self-pay | Admitting: Adult Health

## 2017-07-01 DIAGNOSIS — J309 Allergic rhinitis, unspecified: Secondary | ICD-10-CM | POA: Diagnosis not present

## 2017-07-01 DIAGNOSIS — I350 Nonrheumatic aortic (valve) stenosis: Secondary | ICD-10-CM

## 2017-07-01 DIAGNOSIS — F0150 Vascular dementia without behavioral disturbance: Secondary | ICD-10-CM | POA: Diagnosis not present

## 2017-07-01 DIAGNOSIS — F339 Major depressive disorder, recurrent, unspecified: Secondary | ICD-10-CM

## 2017-07-01 DIAGNOSIS — G4700 Insomnia, unspecified: Secondary | ICD-10-CM

## 2017-07-01 DIAGNOSIS — E034 Atrophy of thyroid (acquired): Secondary | ICD-10-CM | POA: Diagnosis not present

## 2017-07-01 DIAGNOSIS — E785 Hyperlipidemia, unspecified: Secondary | ICD-10-CM | POA: Diagnosis not present

## 2017-07-01 DIAGNOSIS — H1013 Acute atopic conjunctivitis, bilateral: Secondary | ICD-10-CM

## 2017-07-01 NOTE — Progress Notes (Signed)
DATE:  07/01/2017   MRN:  [id number]  BIRTHDAY: [birth year]  Facility:  Nursing Home Location:  [facility] Room Number: 118-B  LEVEL OF CARE:  SNF (31)  Contact Information    Name Relation Home Work Mobile   [place] Daughter [phone]  [phone]   Ilyana, Manuele [phone]  [phone]   Stoney Bang    [phone]       Code Status History    Date Active Date Inactive Code Status Order ID Comments User Context   12/04/2014  9:04 PM 12/03/2016  1:18 PM DNR [id number]  Hennie Duos, MD Outpatient   11/01/2014 12:40 AM 11/05/2014  6:42 PM DNR [id number]  Rise Patience, MD Inpatient    Questions for Most Recent Historical Code Status (Order [id number])    Question Answer Comment   In the event of cardiac or respiratory ARREST Do not call a "code blue"    In the event of cardiac or respiratory ARREST Do not perform Intubation, CPR, defibrillation or ACLS    In the event of cardiac or respiratory ARREST Use medication by any route, position, wound care, and other measures to relive pain and suffering. May use oxygen, suction and manual treatment of airway obstruction as needed for comfort.        Chief Complaint  Patient presents with  . Medical Management of Chronic Issues    Routine [facility] visit    HISTORY OF PRESENT ILLNESS:  This is a 19-YO female seen for a routine visit.  She is a long-term care resident of [facility].  She has a PMH of depression, HLD, hypertension, osteoporosis, thyroid disease, and C. difficile colitis. She was seen in the room today. No redness on both eyes. She was recently treated with Patanol eye drops for allergic conjunctivitis.    PAST MEDICAL HISTORY:  Past Medical History:  Diagnosis Date  . C. difficile colitis   . Depression   . Hyperlipidemia   . Hypertension   . Osteoporosis   . Thyroid disease   . UTI (lower urinary tract infection)      CURRENT  MEDICATIONS: Reviewed  Patient's Medications  New Prescriptions   No medications on file  Previous Medications   ACETAMINOPHEN (TYLENOL) 500 MG TABLET    Take 1,000 mg by mouth 2 (two) times daily as needed for moderate pain.    CALCIUM CARB-CHOLECALCIFEROL (CALCIUM 600 + D) 600-200 MG-UNIT TABS    Take by mouth.   CHOLECALCIFEROL (VITAMIN D) 1000 UNITS TABLET    Take 1,000 Units by mouth daily.   CLOPIDOGREL (PLAVIX) 75 MG TABLET    Take 75 mg by mouth daily.   FEXOFENADINE (ALLEGRA) 180 MG TABLET    Take 180 mg by mouth daily.   FLUTICASONE (FLONASE) 50 MCG/ACT NASAL SPRAY    Place 2 sprays into both nostrils daily.   LEVOTHYROXINE (SYNTHROID, LEVOTHROID) 75 MCG TABLET    Take 75 mcg by mouth daily before breakfast.   MELATONIN 10 MG CAPS    Take 10 mg by mouth at bedtime.   MONTELUKAST (SINGULAIR) 10 MG TABLET    Take 10 mg by mouth daily.   MULTIPLE VITAMINS-MINERALS (MULTIVITAMIN WITH MINERALS) TABLET    Take 1 tablet by mouth daily.   MULTIPLE VITAMINS-MINERALS (PRESERVISION AREDS 2 PO)    Take 2 capsules by mouth 2 (two) times daily.    NUTRITIONAL SUPPLEMENT LIQD    Take 120 mLs by mouth 2 (two)  times daily. MedPass   OLOPATADINE (PATANOL) 0.1 % OPHTHALMIC SOLUTION    Place 1 drop into both eyes daily.   SERTRALINE (ZOLOFT) 25 MG TABLET    Take 25 mg by mouth daily.  Modified Medications   No medications on file  Discontinued Medications   No medications on file     Allergies  Allergen Reactions  . Fosamax [Alendronate Sodium] Other (See Comments)    Unknown, family is stating there is no allergic/adverse reaction to drug it was just discontinued.  [name] Hctz [Hydrochlorothiazide] Other (See Comments)    unknownUnknown, family is stating there is no allergic/adverse reaction to drug it was just discontinued.  . Latex     Unknown, family is stating there is no allergic/adverse reaction to drug it was just discontinued.  . Neosporin [Neomycin-Bacitracin Zn-Polymyx]   . Norvasc  [Amlodipine Besylate]     Unknown, family is stating there is no allergic/adverse reaction to drug it was just discontinued.  . Bacitracin-Neomycin-Polymyxin Rash  . Lanolin Rash  . Naproxen Rash  . Polysporin [Bacitracin-Polymyxin B] Rash    rash     REVIEW OF SYSTEMS:  Unable to obtain due to dementia    PHYSICAL EXAMINATION  GENERAL APPEARANCE: Well nourished. In no acute distress. Normal body habitus SKIN:  Skin is warm and dry.  MOUTH and THROAT: Lips are without lesions. Oral mucosa is moist and without lesions. RESPIRATORY: breathing is even & unlabored, BS CTAB CARDIAC: RRR, no murmur,no extra heart sounds, no edema GI: abdomen soft, normal BS, no masses, no tenderness, no hepatomegaly, no splenomegaly EXTREMITIES:  Able to move X 4 extremities PSYCHIATRIC: Alert to self, disoriented to time and place. Affect and behavior are appropriate   LABS/RADIOLOGY: Labs reviewed: Basic Metabolic Panel:  Recent Labs  12/03/16 1418  NA 131*  K 4.8  CL 102  CO2 23  GLUCOSE 149*  BUN 12  CREATININE 1.19*  CALCIUM 8.6*   Liver Function Tests:  Recent Labs  12/03/16 1418  AST 44*  ALT 13*  ALKPHOS 51  BILITOT 2.0*  PROT 7.0  ALBUMIN 3.1*   CBC:  Recent Labs  12/03/16 1418  WBC 10.4  HGB 11.5*  HCT 33.5*  MCV 91.8  PLT 202    ASSESSMENT/PLAN:  1. Aortic valve stenosis, etiology of cardiac valve disease unspecified - stable, continue Plavix 75 mg 1 tab daily   2. Allergic conjunctivitis of both eyes - olopatadine 0.1% 1 drop into both eyes daily 2 weeks   3. Hypothyroidism due to acquired atrophy of thyroid - continue levothyroxine 75 g 1 tab daily, check TSH Lab Results  Component Value Date   TSH 3.048 11/03/2014    4. Hyperlipidemia, unspecified hyperlipidemia type - will check lipid panel   5. Vascular dementia without behavioral disturbance - stable, continue supportive care, fall precautions, check CBC   6. Insomnia, unspecified type  - stable, continue melatonin 5 mg 1 tab daily at bedtime   7. Allergic rhinitis, unspecified seasonality, unspecified trigger - continue fexofenadine 180 mg 1 tab daily, fluticasone 50 g spray instilled 2 sprays in each nostril daily, and montelukast 10 mg 1 tab daily   8. Depression, recurrent ([place]) - continue sertraline 25 mg 1 tab daily - mood is stable, continue sertraline 25 mg 1 tab daily, check CMP    Goals of care:  Long-term care    Karrin Eisenmenger C. Fulton - NP    [employer] [phone]

## 2017-07-04 LAB — HEPATIC FUNCTION PANEL
ALT: 15 (ref 7–35)
AST: 20 (ref 13–35)
Alkaline Phosphatase: 66 (ref 25–125)
BILIRUBIN, TOTAL: 0.2

## 2017-07-04 LAB — LIPID PANEL
Cholesterol: 144 (ref 0–200)
HDL: 32 — AB (ref 35–70)
LDL Cholesterol: 88
LDl/HDL Ratio: 4.5

## 2017-07-04 LAB — CBC AND DIFFERENTIAL
HEMATOCRIT: 28 — AB (ref 36–46)
Hemoglobin: 9.3 — AB (ref 12.0–16.0)
Neutrophils Absolute: 5
Platelets: 307 (ref 150–399)
WBC: 6.9

## 2017-07-04 LAB — BASIC METABOLIC PANEL
BUN: 11 (ref 4–21)
Creatinine: 0.9 (ref 0.5–1.1)
GLUCOSE: 85
POTASSIUM: 4 (ref 3.4–5.3)
SODIUM: 140 (ref 137–147)

## 2017-07-04 LAB — TSH: TSH: 2.29 (ref 0.41–5.90)

## 2017-07-07 DIAGNOSIS — R1110 Vomiting, unspecified: Secondary | ICD-10-CM | POA: Diagnosis not present

## 2017-07-14 ENCOUNTER — Encounter: Payer: Self-pay | Admitting: Internal Medicine

## 2017-07-14 ENCOUNTER — Non-Acute Institutional Stay (SKILLED_NURSING_FACILITY): Payer: Medicare Other | Admitting: Internal Medicine

## 2017-07-14 DIAGNOSIS — H10503 Unspecified blepharoconjunctivitis, bilateral: Secondary | ICD-10-CM

## 2017-07-14 NOTE — Patient Instructions (Addendum)
See Current Assessment & Plan under acute Diagnosis

## 2017-07-14 NOTE — Progress Notes (Signed)
    NURSING HOME LOCATION:  [facility] ROOM NUMBER:  118-B  CODE STATUS:  DNR  PCP:  Lajean Manes, MD  [address] [place] [address]  This is a nursing facility follow up for specific acute issue of periorbital erythema with family concern for bacterial conjunctivitis.  Interim medical record and care since last [place] visit was updated with review of diagnostic studies and change in clinical status since last visit were documented.  HPI: The patient was seen 06/21/17 for clinical allergic conjunctivitis bilaterally manifested as red, itchy eyes. At that time there was slight yellowish crusting of the right eye. Her son-in-law who is a Software engineer requested eyedrops.Patanol 0.1% ophthalmic solution 1 drop in each eye daily for 2 weeks was prescribed with apparent clinical response initially. Staff reports she was wiping her mouth with a washcloth and then applying it to her eyes. Her daughter reported that the patient was cleansing her nose with a cloth and subsequently wiping her eyes. She is allergic to Neosporin and Polysporin.  Review of systems: Dementia invalidated responses. Unable to provide date.She does state "eyes hurt".   Physical exam:  Pertinent or positive findings: She appears frail and chronically ill. Profoundly hard of hearing. Minimal verbal communication. Intermittently wailing. Marked difficulty following commands. Extraocular motion could not be evaluated. Mild bilateral periorbital erythema and drying of the skin. Some crusting of the lower lids. No definite purulence present.Intermittently rubbing eyes. Grade 1.5 systolic murmur present. Breath sounds are decreased. Minimal rales were noted.  General appearance: no acute distress , increased work of breathing is present.   Lymphatic: No lymphadenopathy about the head, neck, axilla . Eyes: There is no scleral icterus. Ears:  External ear exam shows no significant lesions or  deformities.   Nose:  External nasal examination shows no deformity or inflammation. Nasal mucosa are pink and moist without lesions ,exudates Oral exam: lips and gums are healthy appearing.There is no oropharyngeal erythema or exudate . Neck:  No thyromegaly, masses, tenderness noted.    Heart:  Normal rate and regular rhythm. S1 and S2 normal without gallop, click, rub .  Lungs: without wheezes, rhonchi, rubs. Skin: Warm & dry w/o tenting. No significant lesions or rash.  See summary under acute problem with associated updated therapeutic plan

## 2017-08-01 ENCOUNTER — Encounter: Payer: Self-pay | Admitting: Adult Health

## 2017-08-01 ENCOUNTER — Non-Acute Institutional Stay (SKILLED_NURSING_FACILITY): Payer: Medicare Other | Admitting: Adult Health

## 2017-08-01 DIAGNOSIS — E034 Atrophy of thyroid (acquired): Secondary | ICD-10-CM | POA: Diagnosis not present

## 2017-08-01 DIAGNOSIS — F339 Major depressive disorder, recurrent, unspecified: Secondary | ICD-10-CM

## 2017-08-01 DIAGNOSIS — I350 Nonrheumatic aortic (valve) stenosis: Secondary | ICD-10-CM | POA: Diagnosis not present

## 2017-08-01 DIAGNOSIS — J309 Allergic rhinitis, unspecified: Secondary | ICD-10-CM | POA: Diagnosis not present

## 2017-08-01 DIAGNOSIS — G4700 Insomnia, unspecified: Secondary | ICD-10-CM

## 2017-08-01 DIAGNOSIS — H10503 Unspecified blepharoconjunctivitis, bilateral: Secondary | ICD-10-CM | POA: Diagnosis not present

## 2017-08-01 DIAGNOSIS — F0150 Vascular dementia without behavioral disturbance: Secondary | ICD-10-CM | POA: Diagnosis not present

## 2017-08-01 NOTE — Progress Notes (Signed)
Location:  [facility] Room Number: 118-A Place of Service:  SNF (31) Provider:  Durenda Age, NP  Patient Care Team: Lajean Manes, MD as PCP - General (Internal Medicine)  Extended Emergency Contact Information Primary Emergency Contact: Burton,[name] Address: [address]          [place] [address] [name] of [place] Phone: [phone] Mobile Phone: [phone] Relation: Daughter Secondary Emergency Contact: Kyung Rudd [place] of [place] Phone: [phone] Mobile Phone: [phone] Relation: Son  Code Status:  DNR Goals of care: Advanced Directive information Advanced Directives 07/14/2017  Does Patient Have a Medical Advance Directive? Yes  Type of Advance Directive Out of facility DNR (pink MOST or yellow form)  Does patient want to make changes to medical advance directive? No - Patient declined  Would patient like information on creating a medical advance directive? -     Chief Complaint  Patient presents with  . Medical Management of Chronic Issues    Routine [facility] visit    HPI:  Pt is a 83 y.o. female seen today for medical management of chronic diseases.  She is a long-term care resident of [facility].  She has a PMH of HLD, depression, HTN, osteoporosis, thyroid disease, and C. difficile colitis. She was seen in the room today. Noted right eye shut with yellowish crusting and erythematous conjunctiva. Discussed resident's condition with son who was visiting.   Past Medical History:  Diagnosis Date  . C. difficile colitis   . Depression   . Hyperlipidemia   . Hypertension   . Osteoporosis   . Thyroid disease   . UTI (lower urinary tract infection)    Past Surgical History:  Procedure Laterality Date  . FOOT SURGERY    . neck surgery      Allergies  Allergen Reactions  . Fosamax [Alendronate Sodium] Other (See Comments)    Unknown, family is stating there is no  allergic/adverse reaction to drug it was just discontinued.  [name] Hctz [Hydrochlorothiazide] Other (See Comments)    unknownUnknown, family is stating there is no allergic/adverse reaction to drug it was just discontinued.  . Latex     Unknown, family is stating there is no allergic/adverse reaction to drug it was just discontinued.  . Neosporin [Neomycin-Bacitracin Zn-Polymyx]   . Norvasc [Amlodipine Besylate]     Unknown, family is stating there is no allergic/adverse reaction to drug it was just discontinued.  . Bacitracin-Neomycin-Polymyxin Rash  . Lanolin Rash  . Naproxen Rash  . Polysporin [Bacitracin-Polymyxin B] Rash    rash    Outpatient Encounter Medications as of 08/01/2017  Medication Sig  . acetaminophen (TYLENOL) 500 MG tablet Take 1,000 mg by mouth 2 (two) times daily as needed for moderate pain.   . Calcium Carbonate-Vitamin D3 (CALCIUM 600-D) 600-400 MG-UNIT TABS Take 1 each by mouth daily.  . cholecalciferol (VITAMIN D) 1000 units tablet Take 1,000 Units daily by mouth.   . clopidogrel (PLAVIX) 75 MG tablet Take 75 mg by mouth daily.  [name] erythromycin ophthalmic ointment Place 1 application into the right eye 4 (four) times daily.  . fexofenadine (ALLEGRA) 180 MG tablet Take 180 mg daily by mouth.   . fluticasone (FLONASE) 50 MCG/ACT nasal spray Place 2 sprays daily into both nostrils.   [name] levothyroxine (SYNTHROID, LEVOTHROID) 75 MCG tablet Take 75 mcg by mouth daily before breakfast.  . Melatonin 10 MG CAPS Take 10 mg by mouth at bedtime.  . montelukast (SINGULAIR)  10 MG tablet Take 10 mg by mouth daily.  . Multiple Vitamins-Minerals (MULTIVITAMIN WITH MINERALS) tablet Take 1 tablet by mouth daily.  . Multiple Vitamins-Minerals (PRESERVISION AREDS 2 PO) Take 2 capsules by mouth 2 (two) times daily.   [name] NUTRITIONAL SUPPLEMENT LIQD Take 120 mLs by mouth 2 (two) times daily. MedPass  . sertraline (ZOLOFT) 25 MG tablet Take 25 mg by mouth daily.  . [DISCONTINUED] Calcium  Carb-Cholecalciferol (CALCIUM 600 + D) 600-200 MG-UNIT TABS Take 1 tablet daily by mouth.   . [DISCONTINUED] olopatadine (PATANOL) 0.1 % ophthalmic solution Place 1 drop daily into both eyes.   No facility-administered encounter medications on file as of 08/01/2017.     Review of Systems  Unable to obtain due to dementia    Immunization History  Administered Date(s) Administered  . Influenza Split 06/25/2013  . Influenza, High Dose Seasonal PF 07/12/2014  . Influenza-Unspecified 06/12/2017  . Pneumococcal Conjugate-13 07/12/2014  . Pneumococcal Polysaccharide-23 06/13/2001  . Pneumococcal-Unspecified 07/13/1989  . Tdap 03/19/2003   Pertinent  Health Maintenance Due  Topic Date Due  . DEXA SCAN  08/30/2018 (Originally 07/13/1989)  . INFLUENZA VACCINE  Completed  . PNA vac Low Risk Adult  Completed     Vitals:   08/01/17 1605  BP: 120/68  Pulse: 72  Resp: 20  Temp: 98.3 F (36.8 C)  TempSrc: Oral  SpO2: 97%  Weight: 124 lb 5.4 oz (56.4 kg)  Height: 5\' 4"  (1.626 m)   Body mass index is 21.34 kg/m.   Physical Exam  GENERAL APPEARANCE: Well nourished. In no acute distress. Normal body habitus SKIN:  Skin is warm and dry.  EYES:  Right eyes with yellowish crusting, right eye with erythematous conjunctiva MOUTH and THROAT: Lips are without lesions. Oral mucosa is moist and without lesions.  RESPIRATORY: Breathing is even & unlabored, BS CTAB CARDIAC: RRR, + murmur,no extra heart sounds, no edema GI: Abdomen soft, normal BS, no masses, no tenderness EXTREMITIES:  Able to move X 4 extremities PSYCHIATRIC: Alert to self, disoriented to time and place. Affect and behavior are appropriate   Labs reviewed: Recent Labs    12/03/16 1418 07/04/17  NA 131* 140  K 4.8 4.0  CL 102  --   CO2 23  --   GLUCOSE 149*  --   BUN 12 11  CREATININE 1.19* 0.9  CALCIUM 8.6*  --    Recent Labs    12/03/16 1418 07/04/17  AST 44* 20  ALT 13* 15  ALKPHOS 51 66  BILITOT 2.0*  --    PROT 7.0  --   ALBUMIN 3.1*  --    Recent Labs    12/03/16 1418 07/04/17  WBC 10.4 6.9  NEUTROABS  --  5  HGB 11.5* 9.3*  HCT 33.5* 28*  MCV 91.8  --   PLT 202 307   Lab Results  Component Value Date   TSH 2.29 07/04/2017   Lab Results  Component Value Date   CHOL 144 07/04/2017   HDL 32 (A) 07/04/2017   LDLCALC 88 07/04/2017     Assessment/Plan  1. Blepharoconjunctivitis of both eyes, unspecified blepharoconjunctivitis type - new, start erythromycin ophthalmic ointment instill 1 cm ribbon into right eye 4 times a day 1 week  2. Depression, recurrent ([place]) - mood is stable, continue sertraline 25 mg 1 tab daily   3. Vascular dementia without behavioral disturbance - continue supportive care, fall precautions   4. Allergic rhinitis, unspecified seasonality, unspecified trigger -  continue fexofenadine 180 mg 1 tab daily, montelukast 10 mg 1 tab daily, and fluticasone 50 g spray instill 2 sprays in each nostril daily   5. Aortic valve stenosis, etiology of cardiac valve disease unspecified - no complaints of chest pain, continue Plavix 75 mg 1 tab daily   6. Hypothyroidism due to acquired atrophy of thyroid - continue levothyroxine 75 g 1 tab daily Lab Results  Component Value Date   TSH 2.29 07/04/2017    7. Insomnia, unspecified type - continue melatonin 5 mg give 2 tabs = 10 mg daily at bedtime     Family/ staff Communication:  Discussed plan of care with resident, son and charge nurse.  Labs/tests ordered:  None  Goals of care:  Long-term care    Sigrid Schwebach C. Dailey - NP [employer] [phone]

## 2017-08-31 ENCOUNTER — Non-Acute Institutional Stay (SKILLED_NURSING_FACILITY): Payer: Medicare Other | Admitting: Adult Health

## 2017-08-31 ENCOUNTER — Encounter: Payer: Self-pay | Admitting: Adult Health

## 2017-08-31 DIAGNOSIS — G4700 Insomnia, unspecified: Secondary | ICD-10-CM | POA: Diagnosis not present

## 2017-08-31 DIAGNOSIS — I350 Nonrheumatic aortic (valve) stenosis: Secondary | ICD-10-CM | POA: Diagnosis not present

## 2017-08-31 DIAGNOSIS — J309 Allergic rhinitis, unspecified: Secondary | ICD-10-CM | POA: Diagnosis not present

## 2017-08-31 DIAGNOSIS — H1013 Acute atopic conjunctivitis, bilateral: Secondary | ICD-10-CM | POA: Diagnosis not present

## 2017-08-31 DIAGNOSIS — F0150 Vascular dementia without behavioral disturbance: Secondary | ICD-10-CM

## 2017-08-31 DIAGNOSIS — E034 Atrophy of thyroid (acquired): Secondary | ICD-10-CM

## 2017-08-31 DIAGNOSIS — F339 Major depressive disorder, recurrent, unspecified: Secondary | ICD-10-CM

## 2017-08-31 DIAGNOSIS — E559 Vitamin D deficiency, unspecified: Secondary | ICD-10-CM

## 2017-08-31 NOTE — Progress Notes (Signed)
Location:  [facility] Room Number: 118-B Place of Service:  SNF (31) Provider:  Durenda Age, NP  Patient Care Team: Lajean Manes, MD as PCP - General (Internal Medicine)  Extended Emergency Contact Information Primary Emergency Contact: Burton,[name] Address: [address]          [place] [address] [place] Phone: [phone] Mobile Phone: [phone] Relation: Daughter Secondary Emergency Contact: Kyung Rudd [place] of [place] Phone: [phone] Mobile Phone: [phone] Relation: Son  Code Status:  DNR  Goals of care: Advanced Directive information Advanced Directives 08/01/2017  Does Patient Have a Medical Advance Directive? Yes  Type of Advance Directive Out of facility DNR (pink MOST or yellow form)  Does patient want to make changes to medical advance directive? No - Patient declined  Would patient like information on creating a medical advance directive? -     Chief Complaint  Patient presents with  . Medical Management of Chronic Issues    Routine [facility] visit    HPI:  Pt is a 84 y.o. female seen today for medical management of chronic diseases.  She is a long-term care resident of [facility].  She has a PMH of depression, HLD, hypertension, osteoporosis, thyroid disease, and C. Difficile colitis. She was seen in the room today. Noted to have erythematous lower conjunctiva. Resident verbalized itching of her right eye. She was noted to scratch right eye with her hands.   Past Medical History:  Diagnosis Date  . C. difficile colitis   . Depression   . Hyperlipidemia   . Hypertension   . Osteoporosis   . Thyroid disease   . UTI (lower urinary tract infection)    Past Surgical History:  Procedure Laterality Date  . FOOT SURGERY    . neck surgery      Allergies  Allergen Reactions  . Fosamax [Alendronate Sodium] Other (See Comments)    Unknown, family is  stating there is no allergic/adverse reaction to drug it was just discontinued.  [name] Hctz [Hydrochlorothiazide] Other (See Comments)    unknownUnknown, family is stating there is no allergic/adverse reaction to drug it was just discontinued.  . Latex     Unknown, family is stating there is no allergic/adverse reaction to drug it was just discontinued.  . Neosporin [Neomycin-Bacitracin Zn-Polymyx]   . Norvasc [Amlodipine Besylate]     Unknown, family is stating there is no allergic/adverse reaction to drug it was just discontinued.  . Bacitracin-Neomycin-Polymyxin Rash  . Lanolin Rash  . Naproxen Rash  . Polysporin [Bacitracin-Polymyxin B] Rash    rash    Outpatient Encounter Medications as of 08/31/2017  Medication Sig  . acetaminophen (TYLENOL) 500 MG tablet Take 1,000 mg by mouth 2 (two) times daily as needed for moderate pain.   . Calcium Carbonate-Vitamin D3 (CALCIUM 600-D) 600-400 MG-UNIT TABS Take 1 each by mouth daily.  . cholecalciferol (VITAMIN D) 1000 units tablet Take 1,000 Units daily by mouth.   . clopidogrel (PLAVIX) 75 MG tablet Take 75 mg by mouth daily.  . fexofenadine (ALLEGRA) 180 MG tablet Take 180 mg daily by mouth.   . fluticasone (FLONASE) 50 MCG/ACT nasal spray Place 2 sprays daily into both nostrils.   [name] levothyroxine (SYNTHROID, LEVOTHROID) 75 MCG tablet Take 75 mcg by mouth daily before breakfast.  . Melatonin 10 MG CAPS Take 10 mg by mouth at bedtime.  . montelukast (SINGULAIR) 10 MG tablet Take 10 mg by mouth daily.  [name]  Multiple Vitamins-Minerals (MULTIVITAMIN WITH MINERALS) tablet Take 1 tablet by mouth daily.  . Multiple Vitamins-Minerals (PRESERVISION AREDS 2 PO) Take 2 capsules by mouth 2 (two) times daily.   [name] NUTRITIONAL SUPPLEMENT LIQD Take 120 mLs by mouth 2 (two) times daily. MedPass  . sertraline (ZOLOFT) 25 MG tablet Take 25 mg by mouth daily.    No facility-administered encounter medications on file as of 08/31/2017.     Review of Systems  Unable to  obtain due to dementia     Immunization History  Administered Date(s) Administered  . Influenza Split 06/25/2013  . Influenza, High Dose Seasonal PF 07/12/2014  . Influenza-Unspecified 06/12/2017  . Pneumococcal Conjugate-13 07/12/2014  . Pneumococcal Polysaccharide-23 06/13/2001  . Pneumococcal-Unspecified 07/13/1989  . Tdap 03/19/2003   Pertinent  Health Maintenance Due  Topic Date Due  . DEXA SCAN  08/30/2018 (Originally 07/13/1989)  . INFLUENZA VACCINE  Completed  . PNA vac Low Risk Adult  Completed      Vitals:   08/31/17 1114  BP: 116/64  Pulse: 69  Resp: 18  Temp: (!) 97.1 F (36.2 C)  TempSrc: Oral  SpO2: 97%  Weight: 124 lb 5.4 oz (56.4 kg)  Height: 5\' 4"  (1.626 m)   Body mass index is 21.34 kg/m.  Physical Exam  GENERAL APPEARANCE: Well nourished. In no acute distress. Normal body habitus SKIN:  Skin is warm and dry.  EYES: Lids open and close normally. Right lower conjunctiva noted to be erythematous MOUTH and THROAT: Lips are without lesions. Oral mucosa is moist and without lesions.  RESPIRATORY: Breathing is even & unlabored, BS CTAB CARDIAC: RRR, + murmur,no extra heart sounds, no edema GI: Abdomen soft, normal BS, no masses, no tenderness EXTREMITIES: Able to move X 4 extremities  PSYCHIATRIC: Alert to self, disoriented to time and place. Affect and behavior are appropriate   Labs reviewed: Recent Labs    12/03/16 1418 07/04/17  NA 131* 140  K 4.8 4.0  CL 102  --   CO2 23  --   GLUCOSE 149*  --   BUN 12 11  CREATININE 1.19* 0.9  CALCIUM 8.6*  --    Recent Labs    12/03/16 1418 07/04/17  AST 44* 20  ALT 13* 15  ALKPHOS 51 66  BILITOT 2.0*  --   PROT 7.0  --   ALBUMIN 3.1*  --    Recent Labs    12/03/16 1418 07/04/17  WBC 10.4 6.9  NEUTROABS  --  5  HGB 11.5* 9.3*  HCT 33.5* 28*  MCV 91.8  --   PLT 202 307   Lab Results  Component Value Date   TSH 2.29 07/04/2017   No results found for: HGBA1C Lab Results    Component Value Date   CHOL 144 07/04/2017   HDL 32 (A) 07/04/2017   LDLCALC 88 07/04/2017    Assessment/Plan  1. Allergic conjunctivitis of both eyes - start Pataday 0.1% 1 drop to right eye daily, eye care daily, monitor for drainage   2. Hypothyroidism due to acquired atrophy of thyroid - continue levothyroxine 75 g 1 tab daily Lab Results  Component Value Date   TSH 2.29 07/04/2017     3. Vascular dementia without behavioral disturbance - continue supportive care, fall precautions   4. Allergic rhinitis, unspecified seasonality, unspecified trigger - stable, continue fexofenadine 180 mg 1 tab daily, montelukast 10 mg 1 tab daily and fluticasone 50 g instill 2 sprays in each nostril daily  5. Depression, recurrent (HCC) - mood is stable, continue sertraline 25 mg 1 tab daily   6. Aortic valve stenosis, etiology of cardiac valve disease unspecified - no complaints of chest pain, continue Plavix 75 mg 1 tab daily   7. Insomnia, unspecified type - continue melatonin 5 mg give 2 tabs = 10 mg 1 tab daily at bedtime  8. Vitamin D deficiency -  Continue Vitamin D 3 1000 units 1 tab daily     Family/ staff Communication: Discussed plan of care with charge nurse and resident.  Labs/tests ordered:  Vitamin D level  Goals of care:   Long-term care   Durenda Age, NP [facility] [phone] (Monday-Friday 8:00 a.m. - 5:00 p.m.) [phone] (after hours)

## 2017-09-01 DIAGNOSIS — E559 Vitamin D deficiency, unspecified: Secondary | ICD-10-CM | POA: Diagnosis not present

## 2017-09-01 DIAGNOSIS — Z79899 Other long term (current) drug therapy: Secondary | ICD-10-CM | POA: Diagnosis not present

## 2017-09-01 LAB — VITAMIN D 25 HYDROXY (VIT D DEFICIENCY, FRACTURES): VIT D 25 HYDROXY: 60

## 2017-10-13 ENCOUNTER — Non-Acute Institutional Stay (SKILLED_NURSING_FACILITY): Payer: Medicare Other | Admitting: Internal Medicine

## 2017-10-13 ENCOUNTER — Encounter: Payer: Self-pay | Admitting: Internal Medicine

## 2017-10-13 DIAGNOSIS — R627 Adult failure to thrive: Secondary | ICD-10-CM | POA: Diagnosis not present

## 2017-10-13 DIAGNOSIS — E034 Atrophy of thyroid (acquired): Secondary | ICD-10-CM | POA: Diagnosis not present

## 2017-10-13 DIAGNOSIS — F0150 Vascular dementia without behavioral disturbance: Secondary | ICD-10-CM

## 2017-10-13 DIAGNOSIS — Z8639 Personal history of other endocrine, nutritional and metabolic disease: Secondary | ICD-10-CM

## 2017-10-13 DIAGNOSIS — Z8669 Personal history of other diseases of the nervous system and sense organs: Secondary | ICD-10-CM | POA: Diagnosis not present

## 2017-10-13 NOTE — Progress Notes (Signed)
    NURSING HOME LOCATION:  [facility] ROOM NUMBER:  118-B  CODE STATUS:  DNR  PCP:  Hendricks Limes, MD  [place] [address]  This is a nursing facility follow up of chronic medical diagnoses  Interim medical record and care since last [place] visit was updated with review of diagnostic studies and change in clinical status since last visit were documented.  HPI: The patient was admitted to the SNF for adult failure to thrive, coming from home. Medical diagnoses include hypertension, stage III kidney disease, aortic stenosis, dyslipidemia, hypothyroidism, and SIADH. The patient has dementia without behavioral disorder. She also has been incontinent of bladder and bowel.  She has had recurrent erythema of the conjunctiva in the context of her repeatedly rubbing her eyes; hand hygiene is poor. She is on allergic eyedrops & also on Flonase, Allegra & Singulair. There is no recent differential to assess eosinophilia . The last value in March 2016 was 4%t.  Labs are not current. Her most recent sodium was normal 140 on 07/04/17. Hepatorenal function was normal. She did have a mild anemia with hemoglobin 9.3 and hematocrit 28. TSH was therapeutic.  Review of systems: Dementia invalidated responses. She was unable to give the date. She followed commands poorly.  She validates some "eye problems" but is unable to delineate.  Physical exam:  Pertinent or positive findings: The patient is pleasantly demented. She will intermittently rub her eyes. When advised to avoid this she states "I try to remind myself". Before the next repetition of eye rubbing, she inserted her index finger into her right nostril. Ectropion is suggested of the right lower lid with slight erythema. Extraocular motion difficult for her to comprehend but was grossly normal. There is no definite conjunctivitis. The left nasolabial fold is decreased. The eyebrows are decreased in volume. She exhibits  a honking murmur grade 1-1.5. Breath sounds are decreased. She is generally weak. She has interosseous wasting of the hands.  Pulses are decreased General appearance: Adequately nourished; no acute distress, increased work of breathing is present.   Lymphatic: No lymphadenopathy about the head, neck, axilla. Eyes:  There is no scleral icterus. Ears:  External ear exam shows no significant lesions or deformities.   Nose:  External nasal examination shows no deformity or inflammation. Nasal mucosa are pink and moist without lesions, exudates Oral exam:  Lips and gums are healthy appearing. There is no oropharyngeal erythema or exudate. Neck:  No thyromegaly, masses, tenderness noted.    Heart:  Normal rate and regular rhythm. S1 and S2 normal without gallop, click, rub .  Lungs: without wheezes, rhonchi,rales , rubs. Abdomen:Bowel sounds are normal. Abdomen is soft and nontender with no organomegaly, hernias,masses. GU: deferred  Extremities:  No cyanosis, clubbing,edema  Neurologic exam : Balance,Rhomberg,finger to nose testing could not be completed due to clinical state Skin: Warm & dry w/o tenting. No significant lesions or rash.  See summary under each active problem in the Problem List with associated updated therapeutic plan

## 2017-10-13 NOTE — Assessment & Plan Note (Signed)
Namenda and Aricept would not be indicated for vascular dementia. Focus should be on controlling glucose, lipids, and blood pressure.

## 2017-10-13 NOTE — Assessment & Plan Note (Addendum)
Dementia prevents her being compliant with hand hygiene She's @ high risk for conjunctivitis Continue allergy eyedrops Singulair is unlikely to be of benefit in absence of asthma with associated eosinophilia She is on the nonsedating histamine fexofenadine as well as Flonase Hand cleanser will be In the room to clean her hands Ophthalmology appointment is pending as per her daughter

## 2017-10-13 NOTE — Assessment & Plan Note (Signed)
CMET to assess present sodium level

## 2017-10-13 NOTE — Assessment & Plan Note (Signed)
Current TSH is therapeutic

## 2017-10-13 NOTE — Assessment & Plan Note (Addendum)
Clinically stable without significant progression Check CMET to assess albumin and total protein stores

## 2017-10-13 NOTE — Patient Instructions (Addendum)
See assessment and plan under each diagnosis in the problem list and acutely for this visit 

## 2017-10-15 DIAGNOSIS — Z79899 Other long term (current) drug therapy: Secondary | ICD-10-CM | POA: Diagnosis not present

## 2017-10-15 DIAGNOSIS — D649 Anemia, unspecified: Secondary | ICD-10-CM | POA: Diagnosis not present

## 2017-10-15 LAB — BASIC METABOLIC PANEL
BUN: 13 (ref 4–21)
CREATININE: 0.8 (ref 0.5–1.1)
GLUCOSE: 82
POTASSIUM: 4.6 (ref 3.4–5.3)
SODIUM: 135 — AB (ref 137–147)

## 2017-10-15 LAB — CBC AND DIFFERENTIAL
HCT: 32 — AB (ref 36–46)
HEMOGLOBIN: 11.4 — AB (ref 12.0–16.0)
Neutrophils Absolute: 3
Platelets: 174 (ref 150–399)
WBC: 4.7

## 2017-10-15 LAB — HEPATIC FUNCTION PANEL
ALT: 10 (ref 7–35)
AST: 18 (ref 13–35)
Alkaline Phosphatase: 75 (ref 25–125)
Bilirubin, Total: 0.3

## 2017-10-17 DIAGNOSIS — D649 Anemia, unspecified: Secondary | ICD-10-CM | POA: Diagnosis not present

## 2017-10-17 DIAGNOSIS — I10 Essential (primary) hypertension: Secondary | ICD-10-CM | POA: Diagnosis not present

## 2017-10-25 DIAGNOSIS — M6281 Muscle weakness (generalized): Secondary | ICD-10-CM | POA: Diagnosis not present

## 2017-10-25 DIAGNOSIS — N183 Chronic kidney disease, stage 3 (moderate): Secondary | ICD-10-CM | POA: Diagnosis not present

## 2017-10-26 DIAGNOSIS — N183 Chronic kidney disease, stage 3 (moderate): Secondary | ICD-10-CM | POA: Diagnosis not present

## 2017-10-26 DIAGNOSIS — H10403 Unspecified chronic conjunctivitis, bilateral: Secondary | ICD-10-CM | POA: Diagnosis not present

## 2017-10-26 DIAGNOSIS — M6281 Muscle weakness (generalized): Secondary | ICD-10-CM | POA: Diagnosis not present

## 2017-10-27 DIAGNOSIS — N183 Chronic kidney disease, stage 3 (moderate): Secondary | ICD-10-CM | POA: Diagnosis not present

## 2017-10-27 DIAGNOSIS — M6281 Muscle weakness (generalized): Secondary | ICD-10-CM | POA: Diagnosis not present

## 2017-10-28 DIAGNOSIS — N183 Chronic kidney disease, stage 3 (moderate): Secondary | ICD-10-CM | POA: Diagnosis not present

## 2017-10-28 DIAGNOSIS — M6281 Muscle weakness (generalized): Secondary | ICD-10-CM | POA: Diagnosis not present

## 2017-10-29 DIAGNOSIS — N183 Chronic kidney disease, stage 3 (moderate): Secondary | ICD-10-CM | POA: Diagnosis not present

## 2017-10-29 DIAGNOSIS — M6281 Muscle weakness (generalized): Secondary | ICD-10-CM | POA: Diagnosis not present

## 2017-11-01 DIAGNOSIS — N183 Chronic kidney disease, stage 3 (moderate): Secondary | ICD-10-CM | POA: Diagnosis not present

## 2017-11-01 DIAGNOSIS — M6281 Muscle weakness (generalized): Secondary | ICD-10-CM | POA: Diagnosis not present

## 2017-11-02 DIAGNOSIS — M6281 Muscle weakness (generalized): Secondary | ICD-10-CM | POA: Diagnosis not present

## 2017-11-02 DIAGNOSIS — N183 Chronic kidney disease, stage 3 (moderate): Secondary | ICD-10-CM | POA: Diagnosis not present

## 2017-11-03 DIAGNOSIS — M6281 Muscle weakness (generalized): Secondary | ICD-10-CM | POA: Diagnosis not present

## 2017-11-03 DIAGNOSIS — N183 Chronic kidney disease, stage 3 (moderate): Secondary | ICD-10-CM | POA: Diagnosis not present

## 2017-11-04 DIAGNOSIS — N183 Chronic kidney disease, stage 3 (moderate): Secondary | ICD-10-CM | POA: Diagnosis not present

## 2017-11-04 DIAGNOSIS — M6281 Muscle weakness (generalized): Secondary | ICD-10-CM | POA: Diagnosis not present

## 2017-11-07 ENCOUNTER — Non-Acute Institutional Stay (SKILLED_NURSING_FACILITY): Payer: Medicare Other | Admitting: Adult Health

## 2017-11-07 ENCOUNTER — Encounter: Payer: Self-pay | Admitting: Adult Health

## 2017-11-07 DIAGNOSIS — G4700 Insomnia, unspecified: Secondary | ICD-10-CM

## 2017-11-07 DIAGNOSIS — I350 Nonrheumatic aortic (valve) stenosis: Secondary | ICD-10-CM | POA: Diagnosis not present

## 2017-11-07 DIAGNOSIS — N183 Chronic kidney disease, stage 3 (moderate): Secondary | ICD-10-CM | POA: Diagnosis not present

## 2017-11-07 DIAGNOSIS — R400 Somnolence: Secondary | ICD-10-CM

## 2017-11-07 DIAGNOSIS — M6281 Muscle weakness (generalized): Secondary | ICD-10-CM | POA: Diagnosis not present

## 2017-11-07 DIAGNOSIS — J309 Allergic rhinitis, unspecified: Secondary | ICD-10-CM | POA: Diagnosis not present

## 2017-11-07 DIAGNOSIS — F329 Major depressive disorder, single episode, unspecified: Secondary | ICD-10-CM | POA: Diagnosis not present

## 2017-11-07 DIAGNOSIS — E034 Atrophy of thyroid (acquired): Secondary | ICD-10-CM

## 2017-11-07 DIAGNOSIS — F32A Depression, unspecified: Secondary | ICD-10-CM

## 2017-11-07 NOTE — Progress Notes (Signed)
Location:  [facility] Room Number: 118-B Place of Service:  SNF (31) Provider:  Durenda Age, NP  Patient Care Team: Hendricks Limes, MD as PCP - General (Internal Medicine) Medina-Vargas, [name], NP as Nurse Practitioner (Internal Medicine)  Extended Emergency Contact Information Primary Emergency Contact: Burton,[name] Address: [address]          [place] [address] [name] Phone: [phone] Mobile Phone: [phone] Relation: Daughter Secondary Emergency Contact: Kyung Rudd States of [place] Phone: [phone] Mobile Phone: [phone] Relation: Son  Code Status:  DNR  Goals of care: Advanced Directive information Advanced Directives 11/07/2017  Does Patient Have a Medical Advance Directive? Yes  Type of Advance Directive Out of facility DNR (pink MOST or yellow form)  Does patient want to make changes to medical advance directive? No - Patient declined  Would patient like information on creating a medical advance directive? -     Chief Complaint  Patient presents with  . Medical Management of Chronic Issues    Routine [facility] visit    HPI:  Pt is a 84 y.o. female seen today for medical management of chronic diseases. She is a long-term care resident of [facility].  She has a PMH of depression, HLD, HTN, osteoporosis, thyroid disease, and C. difficile colitis. She was seen in her room today with son at bedside. She was sleeping, minimally verbally responsive to verbal stimulation. Son reported that resident has been sleepy lately.     Past Medical History:  Diagnosis Date  . C. difficile colitis   . Depression   . Hyperlipidemia   . Hypertension   . Osteoporosis   . Thyroid disease   . UTI (lower urinary tract infection)    Past Surgical History:  Procedure Laterality Date  . FOOT SURGERY    . neck surgery      Allergies  Allergen Reactions  . Fosamax  [Alendronate Sodium] Other (See Comments)    Unknown, family is stating there is no allergic/adverse reaction to drug it was just discontinued.  [name] Hctz [Hydrochlorothiazide] Other (See Comments)    unknownUnknown, family is stating there is no allergic/adverse reaction to drug it was just discontinued.  . Latex     Unknown, family is stating there is no allergic/adverse reaction to drug it was just discontinued.  . Neosporin [Neomycin-Bacitracin Zn-Polymyx]   . Norvasc [Amlodipine Besylate]     Unknown, family is stating there is no allergic/adverse reaction to drug it was just discontinued.  . Bacitracin-Neomycin-Polymyxin Rash  . Lanolin Rash  . Naproxen Rash  . Polysporin [Bacitracin-Polymyxin B] Rash    rash    Outpatient Encounter Medications as of 11/07/2017  Medication Sig  . acetaminophen (TYLENOL) 500 MG tablet Take 1,000 mg by mouth 2 (two) times daily as needed for moderate pain.   . Calcium Carbonate-Vitamin D3 (CALCIUM 600-D) 600-400 MG-UNIT TABS Take 1 each by mouth daily.  . cholecalciferol (VITAMIN D) 1000 units tablet Take 1,000 Units daily by mouth.   . clopidogrel (PLAVIX) 75 MG tablet Take 75 mg by mouth daily.  [name] Dextromethorphan-Guaifenesin (CHILDRENS MUCUS RELIEF COUGH PO) Take 5 mLs by mouth 3 (three) times daily.  . fexofenadine (ALLEGRA) 180 MG tablet Take 180 mg daily by mouth.   . fluticasone (FLONASE) 50 MCG/ACT nasal spray Place 2 sprays daily into both nostrils.   [name] levothyroxine (SYNTHROID, LEVOTHROID) 75 MCG tablet Take 75 mcg by mouth daily before breakfast.  .  Melatonin 10 MG CAPS Take 10 mg by mouth at bedtime.  . Multiple Vitamins-Minerals (MULTIVITAMIN WITH MINERALS) tablet Take 1 tablet by mouth daily.  . Multiple Vitamins-Minerals (PRESERVISION AREDS 2 PO) Take 1 capsule by mouth 2 (two) times daily.   [name] NUTRITIONAL SUPPLEMENT LIQD Take 120 mLs by mouth 2 (two) times daily. MedPass  . sertraline (ZOLOFT) 25 MG tablet Take 25 mg by mouth daily.   .  [DISCONTINUED] montelukast (SINGULAIR) 10 MG tablet Take 10 mg by mouth daily.  . [DISCONTINUED] Olopatadine HCl (PATADAY OP) Apply 1 drop to eye daily. Right eye   No facility-administered encounter medications on file as of 11/07/2017.     Review of Systems  Unable to obtain due to dementia    Immunization History  Administered Date(s) Administered  . Influenza Split 06/25/2013  . Influenza, High Dose Seasonal PF 07/12/2014  . Influenza-Unspecified 06/12/2017  . Pneumococcal Conjugate-13 07/12/2014  . Pneumococcal Polysaccharide-23 06/13/2001  . Pneumococcal-Unspecified 07/13/1989  . Tdap 03/19/2003   Pertinent  Health Maintenance Due  Topic Date Due  . DEXA SCAN  08/30/2018 (Originally 07/13/1989)  . INFLUENZA VACCINE  Completed  . PNA vac Low Risk Adult  Completed      Vitals:   11/07/17 1147 11/07/17 1217  BP: (!) 168/78 (!) 146/64  Pulse: (!) 56 (!) 50  Resp: 18   Temp: 98.5 F (36.9 C)   TempSrc: Oral   SpO2: 97%   Weight: 106 lb 9.6 oz (48.4 kg)   Height: 5\' 4"  (1.626 m)    Body mass index is 18.3 kg/m.  Physical Exam  GENERAL APPEARANCE:  In no acute distress.  SKIN:  Skin is warm and dry.  MOUTH and THROAT: Lips are without lesions.  RESPIRATORY: Breathing is even & unlabored, BS CTAB CARDIAC: RRR, + murmur,no extra heart sounds, no edema GI: Abdomen soft, normal BS, no masses, no tenderness EXTREMITIES:  Able to move X 4 extremities PSYCHIATRIC:  Affect and behavior are appropriate   Labs reviewed: Recent Labs    12/03/16 1418 07/04/17 10/15/17  NA 131* 140 135*  K 4.8 4.0 4.6  CL 102  --   --   CO2 23  --   --   GLUCOSE 149*  --   --   BUN 12 11 13   CREATININE 1.19* 0.9 0.8  CALCIUM 8.6*  --   --    Recent Labs    12/03/16 1418 07/04/17 10/15/17  AST 44* 20 18  ALT 13* 15 10  ALKPHOS 51 66 75  BILITOT 2.0*  --   --   PROT 7.0  --   --   ALBUMIN 3.1*  --   --    Recent Labs    12/03/16 1418 07/04/17 10/15/17  WBC 10.4 6.9 4.7    NEUTROABS  --  5 3  HGB 11.5* 9.3* 11.4*  HCT 33.5* 28* 32*  MCV 91.8  --   --   PLT 202 307 174   Lab Results  Component Value Date   TSH 2.29 07/04/2017   No results found for: HGBA1C Lab Results  Component Value Date   CHOL 144 07/04/2017   HDL 32 (A) 07/04/2017   LDLCALC 88 07/04/2017    Assessment/Plan  1. Somnolence - according to son, resident has done this in the past and bounced back, will check CBC and BMP, urinalysis with culture and sensitivity to check for any infection and hydration status   2. Hypothyroidism due to acquired  atrophy of thyroid - continue levothyroxine 75 g 1 tab daily Lab Results  Component Value Date   TSH 2.29 07/04/2017     3. Aortic valve stenosis, etiology of cardiac valve disease unspecified - continue Plavix 75 mg 1 tab daily   4. Allergic rhinitis, unspecified seasonality, unspecified trigger - continue fexofenadine 180 mg 1 tab daily and fluticasone 50 g spray instill 2 sprays in each nostril daily   5. Insomnia, unspecified type - with continue melatonin 10 mg daily at bedtime and start melatonin 5 mg 1 tab daily at bedtime when necessary   6. Chronic depression - continue sertraline 25 mg 1 tab daily, psych consult     Family/ staff Communication:  Discussed plan of care with son and charge nurse.  Labs/tests ordered:  CBC and BMP  Goals of care:   Long-term care   Durenda Age, NP [facility] [phone] (Monday-Friday 8:00 a.m. - 5:00 p.m.) [phone] (after hours)

## 2017-11-08 DIAGNOSIS — D649 Anemia, unspecified: Secondary | ICD-10-CM | POA: Diagnosis not present

## 2017-11-08 DIAGNOSIS — I10 Essential (primary) hypertension: Secondary | ICD-10-CM | POA: Diagnosis not present

## 2017-11-08 DIAGNOSIS — M6281 Muscle weakness (generalized): Secondary | ICD-10-CM | POA: Diagnosis not present

## 2017-11-08 DIAGNOSIS — N183 Chronic kidney disease, stage 3 (moderate): Secondary | ICD-10-CM | POA: Diagnosis not present

## 2017-11-08 LAB — CBC AND DIFFERENTIAL
HCT: 31 — AB (ref 36–46)
Hemoglobin: 10.7 — AB (ref 12.0–16.0)
NEUTROS ABS: 3
Platelets: 177 (ref 150–399)
WBC: 5

## 2017-11-08 LAB — BASIC METABOLIC PANEL
BUN: 87 — AB (ref 4–21)
Creatinine: 0.6 (ref 0.5–1.1)
Glucose: 82
POTASSIUM: 4 (ref 3.4–5.3)
SODIUM: 142 (ref 137–147)

## 2017-11-09 DIAGNOSIS — I10 Essential (primary) hypertension: Secondary | ICD-10-CM | POA: Diagnosis not present

## 2017-11-09 DIAGNOSIS — N183 Chronic kidney disease, stage 3 (moderate): Secondary | ICD-10-CM | POA: Diagnosis not present

## 2017-11-09 DIAGNOSIS — N390 Urinary tract infection, site not specified: Secondary | ICD-10-CM | POA: Diagnosis not present

## 2017-11-09 DIAGNOSIS — M6281 Muscle weakness (generalized): Secondary | ICD-10-CM | POA: Diagnosis not present

## 2017-11-09 DIAGNOSIS — Z79899 Other long term (current) drug therapy: Secondary | ICD-10-CM | POA: Diagnosis not present

## 2017-11-09 DIAGNOSIS — R319 Hematuria, unspecified: Secondary | ICD-10-CM | POA: Diagnosis not present

## 2017-11-10 DIAGNOSIS — N183 Chronic kidney disease, stage 3 (moderate): Secondary | ICD-10-CM | POA: Diagnosis not present

## 2017-11-10 DIAGNOSIS — M6281 Muscle weakness (generalized): Secondary | ICD-10-CM | POA: Diagnosis not present

## 2017-11-11 DIAGNOSIS — M6281 Muscle weakness (generalized): Secondary | ICD-10-CM | POA: Diagnosis not present

## 2017-11-11 DIAGNOSIS — N183 Chronic kidney disease, stage 3 (moderate): Secondary | ICD-10-CM | POA: Diagnosis not present

## 2017-11-14 DIAGNOSIS — N183 Chronic kidney disease, stage 3 (moderate): Secondary | ICD-10-CM | POA: Diagnosis not present

## 2017-11-14 DIAGNOSIS — M6281 Muscle weakness (generalized): Secondary | ICD-10-CM | POA: Diagnosis not present

## 2017-11-16 ENCOUNTER — Non-Acute Institutional Stay (SKILLED_NURSING_FACILITY): Payer: Medicare Other | Admitting: Adult Health

## 2017-11-16 ENCOUNTER — Encounter: Payer: Self-pay | Admitting: Adult Health

## 2017-11-16 DIAGNOSIS — R627 Adult failure to thrive: Secondary | ICD-10-CM

## 2017-11-16 DIAGNOSIS — E034 Atrophy of thyroid (acquired): Secondary | ICD-10-CM | POA: Diagnosis not present

## 2017-11-16 DIAGNOSIS — F339 Major depressive disorder, recurrent, unspecified: Secondary | ICD-10-CM

## 2017-11-16 DIAGNOSIS — I350 Nonrheumatic aortic (valve) stenosis: Secondary | ICD-10-CM

## 2017-11-16 DIAGNOSIS — J309 Allergic rhinitis, unspecified: Secondary | ICD-10-CM

## 2017-11-16 NOTE — Progress Notes (Signed)
Location:  [facility] Room Number: 118-B Place of Service:  SNF (31) Provider:  Durenda Age, NP  Patient Care Team: Hendricks Limes, MD as PCP - General (Internal Medicine) Medina-Vargas, [name], NP as Nurse Practitioner (Internal Medicine)  Extended Emergency Contact Information Primary Emergency Contact: Burton,[name] Address: [address]          [place] [address] [name] of [place] Phone: [phone] Mobile Phone: [phone] Relation: Daughter Secondary Emergency Contact: Kyung Rudd States of [place] Phone: [phone] Mobile Phone: [phone] Relation: Son  Code Status:  DNR  Goals of care: Advanced Directive information Advanced Directives 11/07/2017  Does Patient Have a Medical Advance Directive? Yes  Type of Advance Directive Out of facility DNR (pink MOST or yellow form)  Does patient want to make changes to medical advance directive? No - Patient declined  Would patient like information on creating a medical advance directive? -     Chief Complaint  Patient presents with  . Advanced Directive    Care plan meeting    HPI:  Pt is a 84 y.o. female seen today for a care plan meeting.  She is a long-term care resident of [facility].  Sh toe has a PMH of depression, HLD, HTN, osteoporosis, thyroid disease and C. difficile colitis. She was seen in her room today. She was asleep and and woke up to verbal stimulation. Care plan meeting done today with daughter, son-in-law, Social worker and NP. It was verified that resident is still a DNR. Resident is currently on a feeding restorative program. Son had refused for patient to eat longer on the dining table. Daughter said that she has talked to her brother and he would no longer interfere with the feeding program. Resident's latest weight is 106. 6 lbs Body mass index is 18.3 kg/m.  Daughter was requesting for patient to be showered even  though resident refuses. It was explained that resident cannot be forced to take a shower when she doesn't like to. Daughter will be notified when resident refuses. Care plan meeting lasted x 30 minutes.   Past Medical History:  Diagnosis Date  . C. difficile colitis   . Depression   . Hyperlipidemia   . Hypertension   . Osteoporosis   . Thyroid disease   . UTI (lower urinary tract infection)    Past Surgical History:  Procedure Laterality Date  . FOOT SURGERY    . neck surgery      Allergies  Allergen Reactions  . Fosamax [Alendronate Sodium] Other (See Comments)    Unknown, family is stating there is no allergic/adverse reaction to drug it was just discontinued.  [name] Hctz [Hydrochlorothiazide] Other (See Comments)    unknownUnknown, family is stating there is no allergic/adverse reaction to drug it was just discontinued.  . Latex     Unknown, family is stating there is no allergic/adverse reaction to drug it was just discontinued.  . Neosporin [Neomycin-Bacitracin Zn-Polymyx]   . Norvasc [Amlodipine Besylate]     Unknown, family is stating there is no allergic/adverse reaction to drug it was just discontinued.  . Bacitracin-Neomycin-Polymyxin Rash  . Lanolin Rash  . Naproxen Rash  . Polysporin [Bacitracin-Polymyxin B] Rash    rash    Outpatient Encounter Medications as of 11/16/2017  Medication Sig  . acetaminophen (TYLENOL) 500 MG tablet Take 1,000 mg by mouth 2 (two) times daily as needed for moderate pain.   . Calcium Carbonate-Vitamin D3 (CALCIUM 600-D)  600-400 MG-UNIT TABS Take 1 each by mouth daily.  . cholecalciferol (VITAMIN D) 1000 units tablet Take 1,000 Units daily by mouth.   . clopidogrel (PLAVIX) 75 MG tablet Take 75 mg by mouth daily.  . fexofenadine (ALLEGRA) 180 MG tablet Take 180 mg daily by mouth.   . fluticasone (FLONASE) 50 MCG/ACT nasal spray Place 2 sprays daily into both nostrils.   [name] levothyroxine (SYNTHROID, LEVOTHROID) 75 MCG tablet Take 75 mcg  by mouth daily before breakfast.  . Melatonin 10 MG CAPS Take 10 mg by mouth at bedtime.  . Multiple Vitamins-Minerals (MULTIVITAMIN WITH MINERALS) tablet Take 1 tablet by mouth daily.  . Multiple Vitamins-Minerals (PRESERVISION AREDS 2 PO) Take 1 capsule by mouth 2 (two) times daily.   [name] NUTRITIONAL SUPPLEMENT LIQD Take 120 mLs by mouth 2 (two) times daily. MedPass  . Propylene Glycol (SYSTANE BALANCE) 0.6 % SOLN Apply 1 drop to eye 3 (three) times daily as needed (Irritation).  . sertraline (ZOLOFT) 25 MG tablet Take 25 mg by mouth daily. Take 1/2 tablet to = 12.5 mg qd   No facility-administered encounter medications on file as of 11/16/2017.     Review of Systems  Unable to obtain due to dementia    Immunization History  Administered Date(s) Administered  . Influenza Split 06/25/2013  . Influenza, High Dose Seasonal PF 07/12/2014  . Influenza-Unspecified 06/12/2017  . Pneumococcal Conjugate-13 07/12/2014  . Pneumococcal Polysaccharide-23 06/13/2001  . Pneumococcal-Unspecified 07/13/1989  . Tdap 03/19/2003   Pertinent  Health Maintenance Due  Topic Date Due  . DEXA SCAN  08/30/2018 (Originally 07/13/1989)  . INFLUENZA VACCINE  Completed  . PNA vac Low Risk Adult  Completed     Vitals:   11/16/17 1505  BP: 118/75  Pulse: 77  Resp: 19  Temp: 98 F (36.7 C)  TempSrc: Oral  SpO2: 97%  Weight: 106 lb 9.6 oz (48.4 kg)  Height: 5\' 4"  (1.626 m)   Body mass index is 18.3 kg/m.  Physical Exam  GENERAL APPEARANCE:  In no acute distress.  SKIN:  Skin is warm and dry.  MOUTH and THROAT: Lips are without lesions. Oral mucosa is moist and without lesions. Tongue is normal in shape, size, and color and without lesions RESPIRATORY: Breathing is even & unlabored, BS CTAB CARDIAC: RRR, no murmur,no extra heart sounds, no edema GI: Abdomen soft, normal BS, no masses, no tenderness EXTREMITIES:  Able to move X 4 extremities PSYCHIATRIC: Alert to self, disoriented to time and  place. Affect and behavior are appropriate   Labs reviewed: Recent Labs    12/03/16 1418 07/04/17 10/15/17 11/08/17  NA 131* 140 135* 142  K 4.8 4.0 4.6 4.0  CL 102  --   --   --   CO2 23  --   --   --   GLUCOSE 149*  --   --   --   BUN 12 11 13  87*  CREATININE 1.19* 0.9 0.8 0.6  CALCIUM 8.6*  --   --   --    Recent Labs    12/03/16 1418 07/04/17 10/15/17  AST 44* 20 18  ALT 13* 15 10  ALKPHOS 51 66 75  BILITOT 2.0*  --   --   PROT 7.0  --   --   ALBUMIN 3.1*  --   --    Recent Labs    12/03/16 1418 07/04/17 10/15/17 11/08/17  WBC 10.4 6.9 4.7 5.0  NEUTROABS  --  5 3 3  HGB 11.5* 9.3* 11.4* 10.7*  HCT 33.5* 28* 32* 31*  MCV 91.8  --   --   --   PLT 202 307 174 177   Lab Results  Component Value Date   TSH 2.29 07/04/2017    Lab Results  Component Value Date   CHOL 144 07/04/2017   HDL 32 (A) 07/04/2017   LDLCALC 88 07/04/2017    Assessment/Plan 1. Hypothyroidism due to acquired atrophy of thyroid -  Continue Levothyroxine 180 mg 1 tab daily  Lab Results  Component Value Date   TSH 2.29 07/04/2017    2. Adult failure to thrive - Body mass index is 18.3 kg/m. currently on restorative feeding, continue Medpass 120 ml BID   3. Aortic valve stenosis, etiology of cardiac valve disease unspecified - stable, continue Plavix 75 mg daily   4. Depression, recurrent ([place]) -  Daughter requested for her Sertraline from 12.5 mg to 25 mg daily   5. Allergic rhinitis, unspecified seasonality, unspecified trigger - continue Fexofenadine 180 mg daily and Fluticasone 50 mcg 2 sprays in each nostril daily     Family/ staff Communication :  Discussed plan of care with daughter, son-in-  Labs/tests ordered:  None  Goals of care:   Long-term care   Durenda Age, NP [facility] [phone] (Monday-Friday 8:00 a.m. - 5:00 p.m.) [phone] (after hours)

## 2017-11-24 ENCOUNTER — Non-Acute Institutional Stay (SKILLED_NURSING_FACILITY): Payer: Medicare Other | Admitting: Adult Health

## 2017-11-24 ENCOUNTER — Encounter: Payer: Self-pay | Admitting: Adult Health

## 2017-11-24 DIAGNOSIS — D649 Anemia, unspecified: Secondary | ICD-10-CM | POA: Diagnosis not present

## 2017-11-24 DIAGNOSIS — R630 Anorexia: Secondary | ICD-10-CM | POA: Diagnosis not present

## 2017-11-24 DIAGNOSIS — I10 Essential (primary) hypertension: Secondary | ICD-10-CM | POA: Diagnosis not present

## 2017-11-24 NOTE — Progress Notes (Signed)
Location:  [facility] Room Number: 118-B Place of Service:  SNF (31) Provider:  Durenda Age, NP  Patient Care Team: Hendricks Limes, MD as PCP - General (Internal Medicine) Medina-Vargas, [name], NP as Nurse Practitioner (Internal Medicine)  Extended Emergency Contact Information Primary Emergency Contact: Burton,[name] Address: [address]          [place] [address] [name] of [place] Phone: [phone] Mobile Phone: [phone] Relation: Daughter Secondary Emergency Contact: Kyung Rudd States of [place] Phone: [phone] Mobile Phone: [phone] Relation: Son  Code Status:  DNR  Goals of care: Advanced Directive information Advanced Directives 11/24/2017  Does Patient Have a Medical Advance Directive? -  Type of Advance Directive Out of facility DNR (pink MOST or yellow form)  Does patient want to make changes to medical advance directive? Yes (Inpatient - patient requests chaplain consult to change a medical advance directive)  Would patient like information on creating a medical advance directive? -     Chief Complaint  Patient presents with  . Acute Visit    Poor appetite, dehydration    HPI:  Pt is a 84 y.o. female seen today for an acute visit secondary to poor appetite, dehydration.  She is a long-term care resident of [facility].  She has a PMH of depression, HLD, HTN, osteoporosis, thyroid disease, and C. difficile colitis. She was reported to have poor appetite X 2 days. Son has not come to see her X 3 days and has come today. Resident talked to son and ate 80% of her lunch. Stat lab done  showed - glucose 116, BUN 14.1, Na 140, K 3.3, eGFR 77.37, wbc 9.4, hgb 11.4, hct 33.0, platelet 171. She had no fever nor cough. Son came and fed her, ate 80% of her lunch.    Past Medical History:  Diagnosis Date  . C. difficile colitis   . Depression   . Hyperlipidemia   .  Hypertension   . Osteoporosis   . Thyroid disease   . UTI (lower urinary tract infection)    Past Surgical History:  Procedure Laterality Date  . FOOT SURGERY    . neck surgery      Allergies  Allergen Reactions  . Fosamax [Alendronate Sodium] Other (See Comments)    Unknown, family is stating there is no allergic/adverse reaction to drug it was just discontinued.  [name] Hctz [Hydrochlorothiazide] Other (See Comments)    unknownUnknown, family is stating there is no allergic/adverse reaction to drug it was just discontinued.  . Latex     Unknown, family is stating there is no allergic/adverse reaction to drug it was just discontinued.  . Neosporin [Neomycin-Bacitracin Zn-Polymyx]   . Norvasc [Amlodipine Besylate]     Unknown, family is stating there is no allergic/adverse reaction to drug it was just discontinued.  . Bacitracin-Neomycin-Polymyxin Rash  . Lanolin Rash  . Naproxen Rash  . Polysporin [Bacitracin-Polymyxin B] Rash    rash    Outpatient Encounter Medications as of 11/24/2017  Medication Sig  . acetaminophen (TYLENOL) 500 MG tablet Take 1,000 mg by mouth 2 (two) times daily as needed for moderate pain.   . Calcium Carbonate-Vitamin D3 (CALCIUM 600-D) 600-400 MG-UNIT TABS Take 1 each by mouth daily.  . cholecalciferol (VITAMIN D) 1000 units tablet Take 1,000 Units daily by mouth.   . clopidogrel (PLAVIX) 75 MG tablet Take 75 mg by mouth daily.  . fexofenadine (ALLEGRA) 180 MG tablet Take 180 mg  daily by mouth.   . fluticasone (FLONASE) 50 MCG/ACT nasal spray Place 2 sprays daily into both nostrils.   [name] levothyroxine (SYNTHROID, LEVOTHROID) 75 MCG tablet Take 75 mcg by mouth daily before breakfast.  . Melatonin 10 MG CAPS Take 10 mg by mouth at bedtime.  . Multiple Vitamins-Minerals (MULTIVITAMIN WITH MINERALS) tablet Take 1 tablet by mouth daily.  . Multiple Vitamins-Minerals (PRESERVISION AREDS 2 PO) Take 1 capsule by mouth 2 (two) times daily.   [name] NUTRITIONAL  SUPPLEMENT LIQD Take 120 mLs by mouth 2 (two) times daily. MedPass  . Propylene Glycol (SYSTANE BALANCE) 0.6 % SOLN Apply 1 drop to eye 3 (three) times daily as needed (Irritation).  . sertraline (ZOLOFT) 25 MG tablet Take 25 mg by mouth daily.    No facility-administered encounter medications on file as of 11/24/2017.     Review of Systems  Unable to obtain due to dementia    Immunization History  Administered Date(s) Administered  . Influenza Split 06/25/2013  . Influenza, High Dose Seasonal PF 07/12/2014  . Influenza-Unspecified 06/12/2017  . Pneumococcal Conjugate-13 07/12/2014  . Pneumococcal Polysaccharide-23 06/13/2001  . Pneumococcal-Unspecified 07/13/1989  . Tdap 03/19/2003   Pertinent  Health Maintenance Due  Topic Date Due  . DEXA SCAN  08/30/2018 (Originally 07/13/1989)  . INFLUENZA VACCINE  Completed  . PNA vac Low Risk Adult  Completed      Vitals:   11/24/17 1158  BP: 129/74  Pulse: 98  Resp: 18  Temp: 98.1 F (36.7 C)  TempSrc: Oral  SpO2: 97%  Weight: 106 lb 9.6 oz (48.4 kg)  Height: '5\' 4"'  (1.626 m)   Body mass index is 18.3 kg/m.  Physical Exam  GENERAL APPEARANCE:  In no acute distress.  SKIN:  Skin is warm and dry.  MOUTH and THROAT: Lips are without lesions. Oral mucosa is moist and without lesions. Tongue is normal in shape, size, and color and without lesions RESPIRATORY: Breathing is even & unlabored, BS CTAB CARDIAC: RRR, + murmur,no extra heart sounds, no edema GI: Abdomen soft, normal BS, no masses, no tenderness EXTREMITIES:  Able to move X 4 extremities, BLE has generalized weakness PSYCHIATRIC: Alert to self, disoriented to time and place. Affect and behavior are appropriate   Labs reviewed: 11/24/17   glucose 116, BUN 14.1, Na 140, K 3.3, eGFR 77.37, wbc 9.4, hgb 11.4, hct 33.0, platelet 171 Recent Labs    12/03/16 1418 07/04/17 10/15/17 11/08/17  NA 131* 140 135* 142  K 4.8 4.0 4.6 4.0  CL 102  --   --   --   CO2 23  --    --   --   GLUCOSE 149*  --   --   --   BUN '12 11 13 ' 87*  CREATININE 1.19* 0.9 0.8 0.6  CALCIUM 8.6*  --   --   --    Recent Labs    12/03/16 1418 07/04/17 10/15/17  AST 44* 20 18  ALT 13* 15 10  ALKPHOS 51 66 75  BILITOT 2.0*  --   --   PROT 7.0  --   --   ALBUMIN 3.1*  --   --    Recent Labs    12/03/16 1418 07/04/17 10/15/17 11/08/17  WBC 10.4 6.9 4.7 5.0  NEUTROABS  --  '5 3 3  ' HGB 11.5* 9.3* 11.4* 10.7*  HCT 33.5* 28* 32* 31*  MCV 91.8  --   --   --   PLT 202 307  174 177   Lab Results  Component Value Date   TSH 2.29 07/04/2017    Lab Results  Component Value Date   CHOL 144 07/04/2017   HDL 32 (A) 07/04/2017   LDLCALC 88 07/04/2017    Assessment/Plan  1. Poor appetite -  Labs does not show she has dehydration, was able to drink 2 cups of ice tea with sone and ate 89% of her lunch, will start Eldertonic 15 ml PO BID, talked to daughter and informed her of the lab results.   Durenda Age, NP [facility] and Adult Medicine [phone] (Monday-Friday 8:00 a.m. - 5:00 p.m.) [phone] (after hours)

## 2017-12-01 ENCOUNTER — Encounter: Payer: Self-pay | Admitting: Internal Medicine

## 2017-12-01 ENCOUNTER — Non-Acute Institutional Stay (SKILLED_NURSING_FACILITY): Payer: Medicare Other | Admitting: Internal Medicine

## 2017-12-01 DIAGNOSIS — R634 Abnormal weight loss: Secondary | ICD-10-CM

## 2017-12-01 DIAGNOSIS — R627 Adult failure to thrive: Secondary | ICD-10-CM

## 2017-12-01 NOTE — Assessment & Plan Note (Signed)
12/01/17 the progressive weight loss is most likely due to her vascular dementia TSH will be rechecked Trial of low dose Setraline

## 2017-12-01 NOTE — Progress Notes (Signed)
    NURSING HOME LOCATION:  [facility] ROOM NUMBER:  118-B  CODE STATUS:  DNR  PCP:  Hendricks Limes, MD  [place] [address]  This is a nursing facility follow up of weight loss.  Interim medical record and care since last [facility] visit was updated with review of diagnostic studies and change in clinical status since last visit were documented.  HPI: Nutritionist reports weight loss of 16.6 pounds over the last 176 days and 5.6 pounds over past 28 days. Med Pass protein supplement will be increased to 3 times a day. Medical diagnoses include vascular dementia, hypothyroidism, history of SIADH, depression ,and adult failure to thrive. Labs 11/08/17 revealed a sodium of 142. The lowest sodium on record was 131 on 12/03/16. Creatinine was 0.6 but this may represent protein caloric wasting. She has had an anemia which has been somewhat variable. The hemoglobin has varied from 9.3 up to 11.4. On 3/12 hemoglobin was 10.7. TSH was therapeutic at 2.29 on 07/04/17. She is on L-thyroxine 75 g daily.  Review of systems: Dementia invalidated responses. She was unable to voice any complaints. Most responses were a mumbled,"hum" which was unintelligible. Significantly she has had recurrent conjunctivitis in the context of her repeatedly scratching her eyes. She has antibiotic eyedrops to be used as needed.  Physical exam:  Pertinent or positive findings: Facies are blank. Lids are puffy but there is no clinical conjunctivitis. Teeth are coated. There is faint hirsutism of the chin. Grade 1.5 systolic murmur is present at the base. This is blowing in character. Breath sounds are decreased. There is wasting of extremities especially interosseous wasting of the hands. Pedal pulses are decreased. She does not follow commands to allow testing of strength to opposition.  General appearance: no acute distress, increased work of breathing is present.   Lymphatic: No lymphadenopathy  about the head, neck, axilla. Eyes: No conjunctival inflammation or lid edema is present. There is no scleral icterus. Ears:  External ear exam shows no significant lesions or deformities.   Nose:  External nasal examination shows no deformity or inflammation. Nasal mucosa are pink and moist without lesions, exudates Oral exam:  Lips and gums are healthy appearing. There is no oropharyngeal erythema or exudate. Neck:  No thyromegaly, masses, tenderness noted.    Heart:  Normal rate and regular rhythm. S1 and S2 normal without gallop, click, rub .  Lungs: without wheezes, rhonchi,rales , rubs. Abdomen:Bowel sounds are normal. Abdomen is soft and nontender with no organomegaly, hernias,masses. GU: deferred  Extremities:  No cyanosis, clubbing,edema  Skin: Warm & dry w/o tenting. No significant lesions or rash.  See summary under each active problem in the Problem List with associated updated therapeutic plan

## 2017-12-01 NOTE — Patient Instructions (Signed)
See assessment and plan under each diagnosis in the problem list and acutely for this visit 

## 2017-12-05 DIAGNOSIS — I10 Essential (primary) hypertension: Secondary | ICD-10-CM | POA: Diagnosis not present

## 2017-12-05 DIAGNOSIS — E039 Hypothyroidism, unspecified: Secondary | ICD-10-CM | POA: Diagnosis not present

## 2017-12-05 LAB — TSH: TSH: 5.95 — AB (ref 0.41–5.90)

## 2017-12-13 DIAGNOSIS — F325 Major depressive disorder, single episode, in full remission: Secondary | ICD-10-CM | POA: Diagnosis not present

## 2017-12-25 ENCOUNTER — Encounter: Payer: Self-pay | Admitting: Internal Medicine

## 2017-12-25 DIAGNOSIS — F325 Major depressive disorder, single episode, in full remission: Secondary | ICD-10-CM | POA: Insufficient documentation

## 2017-12-29 ENCOUNTER — Non-Acute Institutional Stay (SKILLED_NURSING_FACILITY): Payer: Medicare Other | Admitting: Adult Health

## 2017-12-29 ENCOUNTER — Encounter: Payer: Self-pay | Admitting: Adult Health

## 2017-12-29 DIAGNOSIS — R627 Adult failure to thrive: Secondary | ICD-10-CM

## 2017-12-29 DIAGNOSIS — J309 Allergic rhinitis, unspecified: Secondary | ICD-10-CM | POA: Diagnosis not present

## 2017-12-29 DIAGNOSIS — F0150 Vascular dementia without behavioral disturbance: Secondary | ICD-10-CM | POA: Diagnosis not present

## 2017-12-29 DIAGNOSIS — F325 Major depressive disorder, single episode, in full remission: Secondary | ICD-10-CM | POA: Diagnosis not present

## 2017-12-29 DIAGNOSIS — E034 Atrophy of thyroid (acquired): Secondary | ICD-10-CM | POA: Diagnosis not present

## 2017-12-29 NOTE — Progress Notes (Signed)
Location:  [place] Room Number: 118-B Place of Service:  SNF (31) Provider:  Durenda Age, NP  Patient Care Team: Hendricks Limes, MD as PCP - General (Internal Medicine) Medina-Vargas, [name], NP as Nurse Practitioner (Internal Medicine)  Extended Emergency Contact Information Primary Emergency Contact: Burton,[name] Address: [address]          [place] [address] [name] of [place] Phone: [phone] Mobile Phone: [phone] Relation: Daughter Secondary Emergency Contact: Kyung Rudd States of [place] Phone: [phone] Mobile Phone: [phone] Relation: Son  Code Status:  DNR  Goals of care: Advanced Directive information Advanced Directives 11/24/2017  Does Patient Have a Medical Advance Directive? -  Type of Advance Directive Out of facility DNR (pink MOST or yellow form)  Does patient want to make changes to medical advance directive? Yes (Inpatient - patient requests chaplain consult to change a medical advance directive)  Would patient like information on creating a medical advance directive? -     Chief Complaint  Patient presents with  . Medical Management of Chronic Issues    Routine [facility] visit    HPI:  Pt is a 84 y.o. female seen today for medical management of chronic diseases.  She is a long-term care resident of [facility].  She has a PMH of depression, HLD, hypertension, osteoporosis, thyroid disease, and C. Difficile colitis. She was recently started on on Sertraline for depression. Latest weight is  102.4 lbs Body mass index is 17.58 kg/m.    Past Medical History:  Diagnosis Date  . C. difficile colitis   . Depression   . Hyperlipidemia   . Hypertension   . Osteoporosis   . Thyroid disease   . UTI (lower urinary tract infection)    Past Surgical History:  Procedure Laterality Date  . FOOT SURGERY    . neck surgery      Allergies  Allergen  Reactions  . Fosamax [Alendronate Sodium] Other (See Comments)    Unknown, family is stating there is no allergic/adverse reaction to drug it was just discontinued.  [name] Hctz [Hydrochlorothiazide] Other (See Comments)    unknownUnknown, family is stating there is no allergic/adverse reaction to drug it was just discontinued.  . Latex     Unknown, family is stating there is no allergic/adverse reaction to drug it was just discontinued.  . Neosporin [Neomycin-Bacitracin Zn-Polymyx]   . Norvasc [Amlodipine Besylate]     Unknown, family is stating there is no allergic/adverse reaction to drug it was just discontinued.  . Bacitracin-Neomycin-Polymyxin Rash  . Lanolin Rash  . Naproxen Rash  . Polysporin [Bacitracin-Polymyxin B] Rash    rash    Outpatient Encounter Medications as of 12/29/2017  Medication Sig  . acetaminophen (TYLENOL) 500 MG tablet Take 1,000 mg by mouth 2 (two) times daily as needed for moderate pain.   . Calcium Carbonate-Vitamin D3 (CALCIUM 600-D) 600-400 MG-UNIT TABS Take 1 each by mouth daily.  . cholecalciferol (VITAMIN D) 1000 units tablet Take 1,000 Units daily by mouth.   . clopidogrel (PLAVIX) 75 MG tablet Take 75 mg by mouth daily.  . fexofenadine (ALLEGRA) 180 MG tablet Take 180 mg daily by mouth.   . fluticasone (FLONASE) 50 MCG/ACT nasal spray Place 2 sprays daily into both nostrils.   [name] levothyroxine (SYNTHROID, LEVOTHROID) 75 MCG tablet Take 75 mcg by mouth daily before breakfast.  . Melatonin 10 MG CAPS Take 10 mg by mouth at bedtime.  . Multiple  Vitamins-Minerals (ELDERTONIC PO) Take 15 mLs by mouth 2 (two) times daily.  . Multiple Vitamins-Minerals (MULTIVITAMIN WITH MINERALS) tablet Take 1 tablet by mouth daily.  . Multiple Vitamins-Minerals (PRESERVISION AREDS 2 PO) Take 1 capsule by mouth 2 (two) times daily.   [name] NUTRITIONAL SUPPLEMENT LIQD Take 120 mLs by mouth 2 (two) times daily. MedPass  . Propylene Glycol (SYSTANE BALANCE) 0.6 % SOLN Apply 1 drop to  eye 3 (three) times daily as needed (Irritation).  . sertraline (ZOLOFT) 50 MG tablet Take 50 mg by mouth daily.  . [DISCONTINUED] sertraline (ZOLOFT) 25 MG tablet Take 25 mg by mouth daily.    No facility-administered encounter medications on file as of 12/29/2017.     Review of Systems  Unable to obtain due to dementia    Immunization History  Administered Date(s) Administered  . Influenza Split 06/25/2013  . Influenza, High Dose Seasonal PF 07/12/2014  . Influenza-Unspecified 06/12/2017  . Pneumococcal Conjugate-13 07/12/2014  . Pneumococcal Polysaccharide-23 06/13/2001  . Pneumococcal-Unspecified 07/13/1989  . Tdap 03/19/2003   Pertinent  Health Maintenance Due  Topic Date Due  . DEXA SCAN  08/30/2018 (Originally 07/13/1989)  . INFLUENZA VACCINE  03/30/2018  . PNA vac Low Risk Adult  Completed      Vitals:   12/29/17 1524  BP: 118/64  Pulse: 70  Resp: 20  Temp: 98 F (36.7 C)  TempSrc: Oral  SpO2: 95%  Weight: 102 lb 6.4 oz (46.4 kg)  Height: 5\' 4"  (1.626 m)   Body mass index is 17.58 kg/m.  Physical Exam  GENERAL APPEARANCE: . In no acute distress. SKIN:  Skin is warm and dry.  MOUTH and THROAT: Lips are without lesions. Oral mucosa is moist and without lesions. Tongue is normal in shape, size, and color and without lesions RESPIRATORY: Breathing is even & unlabored, BS CTAB CARDIAC: RRR, +murmur,no extra heart sounds, no edema GI: Abdomen soft, normal BS, no masses, no tenderness EXTREMITIES:  Able to move X 4 extremities PSYCHIATRIC: Alert to self, disoriented to time and place. Affect and behavior are appropriate   Labs reviewed: Recent Labs    07/04/17 10/15/17 11/08/17  NA 140 135* 142  K 4.0 4.6 4.0  BUN 11 13 87*  CREATININE 0.9 0.8 0.6   Recent Labs    07/04/17 10/15/17  AST 20 18  ALT 15 10  ALKPHOS 66 75   Recent Labs    07/04/17 10/15/17 11/08/17  WBC 6.9 4.7 5.0  NEUTROABS 5 3 3   HGB 9.3* 11.4* 10.7*  HCT 28* 32* 31*  PLT 307  174 177   Lab Results  Component Value Date   TSH 5.95 (A) 12/05/2017   No results found for: HGBA1C Lab Results  Component Value Date   CHOL 144 07/04/2017   HDL 32 (A) 07/04/2017   LDLCALC 88 07/04/2017     Assessment/Plan  1. Hypothyroidism due to acquired atrophy of thyroid - continue Levothyroxine 75 mcg daily, for repeat tsh on 01/17/18 Lab Results  Component Value Date   TSH 5.95 (A) 12/05/2017     2. Adult failure to thrive - Body mass index is 17.58 kg/m. continue Eldertonic 15 ml BID, Medpass 120 ml TID    3. Depression, major, single episode, complete remission ([place]) - recently started on Sertraline 50 mg daily, followed by [facility] NP   4. Allergic rhinitis, unspecified seasonality, unspecified trigger - recently started on Fexofenadine 180 mg daily and Fluticasone 50 mcg 2 sprays into each  nostril daily   5. Vascular dementia without behavioral disturbance - continue supportive care, fall precautions    Family/ staff Communication: Discussed plan of care with resident and charge nurse.  Labs/tests ordered:  None  Goals of care:   Long-term care   Durenda Age, NP [facility] [phone] (Monday-Friday 8:00 a.m. - 5:00 p.m.) [phone] (after hours)

## 2018-01-12 DIAGNOSIS — I129 Hypertensive chronic kidney disease with stage 1 through stage 4 chronic kidney disease, or unspecified chronic kidney disease: Secondary | ICD-10-CM | POA: Diagnosis not present

## 2018-01-12 DIAGNOSIS — R1311 Dysphagia, oral phase: Secondary | ICD-10-CM | POA: Diagnosis not present

## 2018-01-13 DIAGNOSIS — R1311 Dysphagia, oral phase: Secondary | ICD-10-CM | POA: Diagnosis not present

## 2018-01-13 DIAGNOSIS — I129 Hypertensive chronic kidney disease with stage 1 through stage 4 chronic kidney disease, or unspecified chronic kidney disease: Secondary | ICD-10-CM | POA: Diagnosis not present

## 2018-01-16 DIAGNOSIS — I129 Hypertensive chronic kidney disease with stage 1 through stage 4 chronic kidney disease, or unspecified chronic kidney disease: Secondary | ICD-10-CM | POA: Diagnosis not present

## 2018-01-16 DIAGNOSIS — R1311 Dysphagia, oral phase: Secondary | ICD-10-CM | POA: Diagnosis not present

## 2018-01-17 DIAGNOSIS — I129 Hypertensive chronic kidney disease with stage 1 through stage 4 chronic kidney disease, or unspecified chronic kidney disease: Secondary | ICD-10-CM | POA: Diagnosis not present

## 2018-01-17 DIAGNOSIS — R1311 Dysphagia, oral phase: Secondary | ICD-10-CM | POA: Diagnosis not present

## 2018-01-18 DIAGNOSIS — I129 Hypertensive chronic kidney disease with stage 1 through stage 4 chronic kidney disease, or unspecified chronic kidney disease: Secondary | ICD-10-CM | POA: Diagnosis not present

## 2018-01-18 DIAGNOSIS — R1311 Dysphagia, oral phase: Secondary | ICD-10-CM | POA: Diagnosis not present

## 2018-01-19 DIAGNOSIS — R1311 Dysphagia, oral phase: Secondary | ICD-10-CM | POA: Diagnosis not present

## 2018-01-19 DIAGNOSIS — E039 Hypothyroidism, unspecified: Secondary | ICD-10-CM | POA: Diagnosis not present

## 2018-01-19 DIAGNOSIS — I129 Hypertensive chronic kidney disease with stage 1 through stage 4 chronic kidney disease, or unspecified chronic kidney disease: Secondary | ICD-10-CM | POA: Diagnosis not present

## 2018-01-20 ENCOUNTER — Non-Acute Institutional Stay (SKILLED_NURSING_FACILITY): Payer: Medicare Other | Admitting: Adult Health

## 2018-01-20 ENCOUNTER — Encounter: Payer: Self-pay | Admitting: Adult Health

## 2018-01-20 DIAGNOSIS — I129 Hypertensive chronic kidney disease with stage 1 through stage 4 chronic kidney disease, or unspecified chronic kidney disease: Secondary | ICD-10-CM | POA: Diagnosis not present

## 2018-01-20 DIAGNOSIS — E034 Atrophy of thyroid (acquired): Secondary | ICD-10-CM

## 2018-01-20 DIAGNOSIS — R1311 Dysphagia, oral phase: Secondary | ICD-10-CM | POA: Diagnosis not present

## 2018-01-20 NOTE — Progress Notes (Signed)
Location:  [place] Room Number: 118-B Place of Service:  SNF (31) Provider:  Durenda Age, NP  Patient Care Team: Hendricks Limes, MD as PCP - General (Internal Medicine) Medina-Vargas, [name], NP as Nurse Practitioner (Internal Medicine)  Extended Emergency Contact Information Primary Emergency Contact: Burton,[name] Address: [place]          [place] [address] [name] of [place] Phone: [phone] Mobile Phone: [phone] Relation: Daughter Secondary Emergency Contact: Kyung Rudd States of [place] Phone: [phone] Mobile Phone: [phone] Relation: Son  Code Status:  DNR  Goals of care: Advanced Directive information Advanced Directives 01/20/2018  Does Patient Have a Medical Advance Directive? Yes  Type of Advance Directive Out of facility DNR (pink MOST or yellow form)  Does patient want to make changes to medical advance directive? No - Patient declined  Would patient like information on creating a medical advance directive? -  Pre-existing out of facility DNR order (yellow form or pink MOST form) Pink MOST form placed in chart (order not valid for inpatient use)     Chief Complaint  Patient presents with  . Acute Visit    Patient has an elevated TSH/med management.    HPI:  Pt is a 84 y.o. female seen today for medical management of chronic diseases.  She is a long-term care resident of [facility].  She has a PMH of depression, hyperlipidemia, HTN, osteoporosis, thyroid disease, and C. difficile colitis. She was seen in her room. She is awake and verbally responsive but confused. Latest tsh 15.64.   Past Medical History:  Diagnosis Date  . C. difficile colitis   . Depression   . Hyperlipidemia   . Hypertension   . Osteoporosis   . Thyroid disease   . UTI (lower urinary tract infection)    Past Surgical History:  Procedure Laterality Date  . FOOT SURGERY    .  neck surgery      Allergies  Allergen Reactions  . Fosamax [Alendronate Sodium] Other (See Comments)    Unknown, family is stating there is no allergic/adverse reaction to drug it was just discontinued.  [name] Hctz [Hydrochlorothiazide] Other (See Comments)    unknownUnknown, family is stating there is no allergic/adverse reaction to drug it was just discontinued.  . Latex     Unknown, family is stating there is no allergic/adverse reaction to drug it was just discontinued.  . Neosporin [Neomycin-Bacitracin Zn-Polymyx]   . Norvasc [Amlodipine Besylate]     Unknown, family is stating there is no allergic/adverse reaction to drug it was just discontinued.  . Bacitracin-Neomycin-Polymyxin Rash  . Lanolin Rash  . Naproxen Rash  . Polysporin [Bacitracin-Polymyxin B] Rash    rash    Outpatient Encounter Medications as of 01/20/2018  Medication Sig  . acetaminophen (TYLENOL) 500 MG tablet Take 1,000 mg by mouth 2 (two) times daily as needed for moderate pain.   . Calcium Carbonate-Vitamin D3 (CALCIUM 600-D) 600-400 MG-UNIT TABS Take 1 each by mouth daily.  . cholecalciferol (VITAMIN D) 1000 units tablet Take 1,000 Units daily by mouth.   . clopidogrel (PLAVIX) 75 MG tablet Take 75 mg by mouth daily.  . fexofenadine (ALLEGRA) 180 MG tablet Take 180 mg daily by mouth.   . fluticasone (FLONASE) 50 MCG/ACT nasal spray Place 2 sprays daily into both nostrils.   . Melatonin 10 MG CAPS Take 10 mg by mouth at bedtime.  . Multiple Vitamins-Minerals (ELDERTONIC PO) Take 15 mLs  by mouth 2 (two) times daily.   . Multiple Vitamins-Minerals (MULTIVITAMIN WITH MINERALS) tablet Take 1 tablet by mouth daily.  . Multiple Vitamins-Minerals (PRESERVISION AREDS 2 PO) Take 1 capsule by mouth 2 (two) times daily.   [name] NUTRITIONAL SUPPLEMENT LIQD Take 120 mLs by mouth 3 (three) times daily. MedPass   . Propylene Glycol (SYSTANE BALANCE) 0.6 % SOLN Apply 1 drop to eye 3 (three) times daily as needed (Irritation).  .  sertraline (ZOLOFT) 50 MG tablet Take 50 mg by mouth daily.  . [DISCONTINUED] levothyroxine (SYNTHROID, LEVOTHROID) 75 MCG tablet Take 75 mcg by mouth daily before breakfast.   No facility-administered encounter medications on file as of 01/20/2018.     Review of Systems  Unable to obtain due to dementia   Immunization History  Administered Date(s) Administered  . Influenza Split 06/25/2013  . Influenza, High Dose Seasonal PF 07/12/2014  . Influenza-Unspecified 06/12/2017  . Pneumococcal Conjugate-13 07/12/2014  . Pneumococcal Polysaccharide-23 06/13/2001  . Pneumococcal-Unspecified 07/13/1989  . Tdap 03/19/2003   Pertinent  Health Maintenance Due  Topic Date Due  . DEXA SCAN  08/30/2018 (Originally 07/13/1989)  . INFLUENZA VACCINE  03/30/2018  . PNA vac Low Risk Adult  Completed      Vitals:   01/20/18 1345  BP: 119/73  Pulse: 74  Resp: 16  Temp: (!) 97.1 F (36.2 C)  TempSrc: Oral  SpO2: 96%  Weight: 102 lb 6.4 oz (46.4 kg)  Height: 5\' 4"  (1.626 m)   Body mass index is 17.58 kg/m.  Physical Exam  GENERAL APPEARANCE:  In no acute distress.  SKIN:  Skin is warm and dry.  MOUTH and THROAT: Lips are without lesions. Oral mucosa is moist and without lesions. Tongue is normal in shape, size, and color and without lesions RESPIRATORY: Breathing is even & unlabored, BS CTAB CARDIAC: RRR, + murmur,no extra heart sounds, no edema GI: Abdomen soft, normal BS, no masses, no tenderness EXTREMITIES:  Able to move X 4 extremities PSYCHIATRIC: Alert to self, disoriented to time and place. Affect and behavior are appropriate  Labs reviewed: Recent Labs    07/04/17 10/15/17 11/08/17  NA 140 135* 142  K 4.0 4.6 4.0  BUN 11 13 87*  CREATININE 0.9 0.8 0.6   Recent Labs    07/04/17 10/15/17  AST 20 18  ALT 15 10  ALKPHOS 66 75   Recent Labs    07/04/17 10/15/17 11/08/17  WBC 6.9 4.7 5.0  NEUTROABS 5 3 3   HGB 9.3* 11.4* 10.7*  HCT 28* 32* 31*  PLT 307 174 177    Lab Results  Component Value Date   TSH 5.95 (A) 12/05/2017   No results found for: HGBA1C Lab Results  Component Value Date   CHOL 144 07/04/2017   HDL 32 (A) 07/04/2017   LDLCALC 88 07/04/2017    Assessment/Plan  1. Hypothyroidism due to acquired atrophy of thyroid - latest tsh 15.64, will start Levothyroxine 88 mcg 1 tab PO daily, check tsh in 6 weeks    Family/ staff Communication: Discussed plan of care with charge nurse.  Labs/tests ordered:  tsh in 6 weeks  Goals of care:   Long-term care.   Durenda Age, NP [facility] [phone] (Monday-Friday 8:00 a.m. - 5:00 p.m.) [phone] (after hours)

## 2018-01-20 NOTE — Progress Notes (Deleted)
Location:  [place] Room Number: 118-B Place of Service:  SNF (31) Provider:  Durenda Age, NP  Patient Care Team: Hendricks Limes, MD as PCP - General (Internal Medicine) Medina-Vargas, [name], NP as Nurse Practitioner (Internal Medicine)  Extended Emergency Contact Information Primary Emergency Contact: Burton,[name] Address: [address]          [place] [address] [name] of [place] Phone: [phone] Mobile Phone: [phone] Relation: Daughter Secondary Emergency Contact: Kyung Rudd States of [place] Phone: [phone] Mobile Phone: [phone] Relation: Son  Code Status:  DNR  Goals of care: Advanced Directive information Advanced Directives 01/20/2018  Does Patient Have a Medical Advance Directive? Yes  Type of Advance Directive Out of facility DNR (pink MOST or yellow form)  Does patient want to make changes to medical advance directive? No - Patient declined  Would patient like information on creating a medical advance directive? -  Pre-existing out of facility DNR order (yellow form or pink MOST form) Pink MOST form placed in chart (order not valid for inpatient use)     Chief Complaint  Patient presents with  . Acute Visit    Patient has an elevated TSH/med management.    HPI:  Pt is a 84 y.o. female seen today for medical management of chronic diseases.  She is a long-term care resident of [facility].  She has a PMH of depression, hyperlpidemia,  Subjective:  [place] is a 84 y.o. female with hypertension. Current Outpatient Medications  Medication Sig Dispense Refill  . acetaminophen (TYLENOL) 500 MG tablet Take 1,000 mg by mouth 2 (two) times daily as needed for moderate pain.     . Calcium Carbonate-Vitamin D3 (CALCIUM 600-D) 600-400 MG-UNIT TABS Take 1 each by mouth daily.    . cholecalciferol (VITAMIN D) 1000 units tablet Take 1,000 Units daily by mouth.     .  clopidogrel (PLAVIX) 75 MG tablet Take 75 mg by mouth daily.    . fexofenadine (ALLEGRA) 180 MG tablet Take 180 mg daily by mouth.     . fluticasone (FLONASE) 50 MCG/ACT nasal spray Place 2 sprays daily into both nostrils.     . Melatonin 10 MG CAPS Take 10 mg by mouth at bedtime.    . Multiple Vitamins-Minerals (ELDERTONIC PO) Take 15 mLs by mouth 2 (two) times daily.     . Multiple Vitamins-Minerals (MULTIVITAMIN WITH MINERALS) tablet Take 1 tablet by mouth daily.    . Multiple Vitamins-Minerals (PRESERVISION AREDS 2 PO) Take 1 capsule by mouth 2 (two) times daily.     [name] NUTRITIONAL SUPPLEMENT LIQD Take 120 mLs by mouth 3 (three) times daily. MedPass     . Propylene Glycol (SYSTANE BALANCE) 0.6 % SOLN Apply 1 drop to eye 3 (three) times daily as needed (Irritation).    . sertraline (ZOLOFT) 50 MG tablet Take 50 mg by mouth daily.     No current facility-administered medications for this visit.     Hypertension ROS: {htn cvs ros:315727::"taking medications as instructed","no medication side effects noted","no TIA's","no chest pain on exertion","no dyspnea on exertion","no swelling of ankles"}.  New concerns: ***.   Objective:  BP 119/73   Pulse 74   Temp (!) 97.1 F (36.2 C) (Oral)   Resp 16   Ht 5\' 4"  (1.626 m)   Wt 102 lb 6.4 oz (46.4 kg)   SpO2 96%   BMI 17.58 kg/m   Appearance {appearance:315021::"alert, well appearing, and in no  distress"}. General exam {htn exam:315726::"BP noted to be well controlled today in office","S1, S2 normal, no gallop, no murmur, chest clear, no JVD, no HSM, no edema"}.  Lab review: {lab reviewed:315731}.   Assessment:   Hypertension {disease control degree:315147}.   Plan:  {disease follow up plans:315730}.    Past Medical History:  Diagnosis Date  . C. difficile colitis   . Depression   . Hyperlipidemia   . Hypertension   . Osteoporosis   . Thyroid disease   . UTI (lower urinary tract infection)    Past Surgical History:  Procedure  Laterality Date  . FOOT SURGERY    . neck surgery      Allergies  Allergen Reactions  . Fosamax [Alendronate Sodium] Other (See Comments)    Unknown, family is stating there is no allergic/adverse reaction to drug it was just discontinued.  [name] Hctz [Hydrochlorothiazide] Other (See Comments)    unknownUnknown, family is stating there is no allergic/adverse reaction to drug it was just discontinued.  . Latex     Unknown, family is stating there is no allergic/adverse reaction to drug it was just discontinued.  . Neosporin [Neomycin-Bacitracin Zn-Polymyx]   . Norvasc [Amlodipine Besylate]     Unknown, family is stating there is no allergic/adverse reaction to drug it was just discontinued.  . Bacitracin-Neomycin-Polymyxin Rash  . Lanolin Rash  . Naproxen Rash  . Polysporin [Bacitracin-Polymyxin B] Rash    rash    Outpatient Encounter Medications as of 01/20/2018  Medication Sig  . acetaminophen (TYLENOL) 500 MG tablet Take 1,000 mg by mouth 2 (two) times daily as needed for moderate pain.   . Calcium Carbonate-Vitamin D3 (CALCIUM 600-D) 600-400 MG-UNIT TABS Take 1 each by mouth daily.  . cholecalciferol (VITAMIN D) 1000 units tablet Take 1,000 Units daily by mouth.   . clopidogrel (PLAVIX) 75 MG tablet Take 75 mg by mouth daily.  . fexofenadine (ALLEGRA) 180 MG tablet Take 180 mg daily by mouth.   . fluticasone (FLONASE) 50 MCG/ACT nasal spray Place 2 sprays daily into both nostrils.   . Melatonin 10 MG CAPS Take 10 mg by mouth at bedtime.  . Multiple Vitamins-Minerals (ELDERTONIC PO) Take 15 mLs by mouth 2 (two) times daily.   . Multiple Vitamins-Minerals (MULTIVITAMIN WITH MINERALS) tablet Take 1 tablet by mouth daily.  . Multiple Vitamins-Minerals (PRESERVISION AREDS 2 PO) Take 1 capsule by mouth 2 (two) times daily.   [name] NUTRITIONAL SUPPLEMENT LIQD Take 120 mLs by mouth 3 (three) times daily. MedPass   . Propylene Glycol (SYSTANE BALANCE) 0.6 % SOLN Apply 1 drop to eye 3 (three)  times daily as needed (Irritation).  . sertraline (ZOLOFT) 50 MG tablet Take 50 mg by mouth daily.  . [DISCONTINUED] levothyroxine (SYNTHROID, LEVOTHROID) 75 MCG tablet Take 75 mcg by mouth daily before breakfast.   No facility-administered encounter medications on file as of 01/20/2018.     Review of Systems  GENERAL: No change in appetite, no fatigue, no weight changes, no fever, chills or weakness SKIN: Denies rash, itching, wounds, ulcer sores, or nail abnormalities EYES: Denies change in vision, dry eyes, eye pain, itching or discharge EARS: Denies change in hearing, ringing in ears, or earache NOSE: Denies nasal congestion or epistaxis MOUTH and THROAT: Denies oral discomfort, gingival pain or bleeding, pain from teeth or hoarseness   RESPIRATORY: no cough, SOB, DOE, wheezing, hemoptysis CARDIAC: No chest pain, edema or palpitations GI: No abdominal pain, diarrhea, constipation, heart burn, nausea or vomiting GU: Denies  dysuria, frequency, hematuria, incontinence, or discharge MUSCULOSKELETAL: Denies joint pain, muscle pain, back pain, restricted movement, or unusual weakness CIRCULATION: Denies claudication, edema of legs, varicosities, or cold extremities NEUROLOGICAL: Denies dizziness, syncope, numbness, or headache PSYCHIATRIC: Denies feelings of depression or anxiety. No report of hallucinations, insomnia, paranoia, or agitation ENDOCRINE: Denies polyphagia, polyuria, polydipsia, heat or cold intolerance HEME/LYMPH: Denies excessive bruising, petechia, enlarged lymph nodes, or bleeding problems IMMUNOLOGIC: Denies history of frequent infections, AIDS, or use of immunosuppressive agents   Immunization History  Administered Date(s) Administered  . Influenza Split 06/25/2013  . Influenza, High Dose Seasonal PF 07/12/2014  . Influenza-Unspecified 06/12/2017  . Pneumococcal Conjugate-13 07/12/2014  . Pneumococcal Polysaccharide-23 06/13/2001  . Pneumococcal-Unspecified  07/13/1989  . Tdap 03/19/2003   Pertinent  Health Maintenance Due  Topic Date Due  . DEXA SCAN  08/30/2018 (Originally 07/13/1989)  . INFLUENZA VACCINE  03/30/2018  . PNA vac Low Risk Adult  Completed      Vitals:   01/20/18 1345  BP: 119/73  Pulse: 74  Resp: 16  Temp: (!) 97.1 F (36.2 C)  TempSrc: Oral  SpO2: 96%  Weight: 102 lb 6.4 oz (46.4 kg)  Height: 5\' 4"  (1.626 m)   Body mass index is 17.58 kg/m.  Physical Exam  GENERAL APPEARANCE: Well nourished. In no acute distress. Normal body habitus SKIN:  Skin is warm and dry. There are no suspicious lesions or rash HEAD: Normal in size and contour. No evidence of trauma EYES: Lids open and close normally. No blepharitis, entropion or ectropion. PERRL. Conjunctivae are clear and sclerae are white. Lenses are without opacity EARS: Pinnae are normal. Patient hears normal voice tunes of the examiner MOUTH and THROAT: Lips are without lesions. Oral mucosa is moist and without lesions. Tongue is normal in shape, size, and color and without lesions NECK: supple, trachea midline, no neck masses, no thyroid tenderness, no thyromegaly LYMPHATICS: No LAN in the neck, no supraclavicular LAN RESPIRATORY: Breathing is even & unlabored, BS CTAB CARDIAC: RRR, no murmur,no extra heart sounds, no edema GI: Abdomen soft, normal BS, no masses, no tenderness, no hepatomegaly, no splenomegaly MUSCULOSKELETAL: No deformities. Movement at each extremity is full and painless. Strength is 5/5 at each extremity. Back is without kyphosis or scoliosis CIRCULATION: Pedal pulses are 2+. There is no edema of the legs, ankles and feet NEUROLOGICAL: There is no tremor. Speech is clear PSYCHIATRIC: Alert and oriented X 3. Affect and behavior are appropriate  Labs reviewed: Recent Labs    07/04/17 10/15/17 11/08/17  NA 140 135* 142  K 4.0 4.6 4.0  BUN 11 13 87*  CREATININE 0.9 0.8 0.6   Recent Labs    07/04/17 10/15/17  AST 20 18  ALT 15 10    ALKPHOS 66 75   Recent Labs    07/04/17 10/15/17 11/08/17  WBC 6.9 4.7 5.0  NEUTROABS 5 3 3   HGB 9.3* 11.4* 10.7*  HCT 28* 32* 31*  PLT 307 174 177   Lab Results  Component Value Date   TSH 5.95 (A) 12/05/2017   No results found for: HGBA1C Lab Results  Component Value Date   CHOL 144 07/04/2017   HDL 32 (A) 07/04/2017   LDLCALC 88 07/04/2017    Assessment/Plan ***   Family/ staff Communication: ***  Labs/tests ordered:  ***  Goals of care:   ***   Durenda Age, NP [facility] [phone] (Monday-Friday 8:00 a.m. - 5:00 p.m.) [phone] (after hours)

## 2018-01-23 DIAGNOSIS — I129 Hypertensive chronic kidney disease with stage 1 through stage 4 chronic kidney disease, or unspecified chronic kidney disease: Secondary | ICD-10-CM | POA: Diagnosis not present

## 2018-01-23 DIAGNOSIS — R1311 Dysphagia, oral phase: Secondary | ICD-10-CM | POA: Diagnosis not present

## 2018-01-24 DIAGNOSIS — I129 Hypertensive chronic kidney disease with stage 1 through stage 4 chronic kidney disease, or unspecified chronic kidney disease: Secondary | ICD-10-CM | POA: Diagnosis not present

## 2018-01-24 DIAGNOSIS — R1311 Dysphagia, oral phase: Secondary | ICD-10-CM | POA: Diagnosis not present

## 2018-01-25 DIAGNOSIS — I129 Hypertensive chronic kidney disease with stage 1 through stage 4 chronic kidney disease, or unspecified chronic kidney disease: Secondary | ICD-10-CM | POA: Diagnosis not present

## 2018-01-25 DIAGNOSIS — R1311 Dysphagia, oral phase: Secondary | ICD-10-CM | POA: Diagnosis not present

## 2018-01-31 ENCOUNTER — Non-Acute Institutional Stay (SKILLED_NURSING_FACILITY): Payer: Medicare Other | Admitting: Adult Health

## 2018-01-31 ENCOUNTER — Encounter: Payer: Self-pay | Admitting: Adult Health

## 2018-01-31 DIAGNOSIS — E034 Atrophy of thyroid (acquired): Secondary | ICD-10-CM | POA: Diagnosis not present

## 2018-01-31 DIAGNOSIS — E559 Vitamin D deficiency, unspecified: Secondary | ICD-10-CM

## 2018-01-31 DIAGNOSIS — I350 Nonrheumatic aortic (valve) stenosis: Secondary | ICD-10-CM | POA: Diagnosis not present

## 2018-01-31 DIAGNOSIS — F0150 Vascular dementia without behavioral disturbance: Secondary | ICD-10-CM | POA: Diagnosis not present

## 2018-01-31 DIAGNOSIS — J309 Allergic rhinitis, unspecified: Secondary | ICD-10-CM

## 2018-01-31 DIAGNOSIS — R630 Anorexia: Secondary | ICD-10-CM | POA: Diagnosis not present

## 2018-01-31 DIAGNOSIS — F325 Major depressive disorder, single episode, in full remission: Secondary | ICD-10-CM | POA: Diagnosis not present

## 2018-01-31 NOTE — Progress Notes (Signed)
Location:  [facility] Room Number: 118-B Place of Service:  SNF (31) Provider:  Durenda Age, NP  Patient Care Team: Hendricks Limes, MD as PCP - General (Internal Medicine) Medina-Vargas, [name], NP as Nurse Practitioner (Internal Medicine)  Extended Emergency Contact Information Primary Emergency Contact: [facility] Address: [address]          [place] [address] [name] Phone: [phone] Mobile Phone: [phone] Relation: Daughter Secondary Emergency Contact: Kyung Rudd [place] of [place] Phone: [phone] Mobile Phone: [phone] Relation: Son  Code Status:  DNR  Goals of care: Advanced Directive information Advanced Directives 01/31/2018  Does Patient Have a Medical Advance Directive? -  Type of Advance Directive Out of facility DNR (pink MOST or yellow form)  Does patient want to make changes to medical advance directive? No - Patient declined  Would patient like information on creating a medical advance directive? -  Pre-existing out of facility DNR order (yellow form or pink MOST form) -     Chief Complaint  Patient presents with  . Medical Management of Chronic Issues    Patient is seen for routine [facility] visit    HPI:  Pt is a 84 y.o. female seen today for medical management of chronic diseases.  She is a long-term care resident of [facility].  She has a PMH of  depression, hyperlipidemia, HTN, osteoporosis, thyroid disease, and C. difficile colitis. She was seen in the room today. She was sleeping but opened her eyes to verbal greetings. Levothyroxine dosage was recently increased to 88 mcg daily due to latest tsh 15.64.    Past Medical History:  Diagnosis Date  . C. difficile colitis   . Depression   . Hyperlipidemia   . Hypertension   . Osteoporosis   . Thyroid disease   . UTI (lower urinary tract infection)    Past Surgical History:  Procedure  Laterality Date  . FOOT SURGERY    . neck surgery      Allergies  Allergen Reactions  . Fosamax [Alendronate Sodium] Other (See Comments)    Unknown, family is stating there is no allergic/adverse reaction to drug it was just discontinued.  [name] Hctz [Hydrochlorothiazide] Other (See Comments)    unknownUnknown, family is stating there is no allergic/adverse reaction to drug it was just discontinued.  . Latex     Unknown, family is stating there is no allergic/adverse reaction to drug it was just discontinued.  . Neosporin [Neomycin-Bacitracin Zn-Polymyx]   . Norvasc [Amlodipine Besylate]     Unknown, family is stating there is no allergic/adverse reaction to drug it was just discontinued.  . Bacitracin-Neomycin-Polymyxin Rash  . Lanolin Rash  . Naproxen Rash  . Polysporin [Bacitracin-Polymyxin B] Rash    rash    Outpatient Encounter Medications as of 01/31/2018  Medication Sig  . acetaminophen (TYLENOL) 500 MG tablet Take 1,000 mg by mouth 2 (two) times daily as needed for moderate pain.   . Calcium Carbonate-Vitamin D3 (CALCIUM 600-D) 600-400 MG-UNIT TABS Take 1 each by mouth daily.  . cholecalciferol (VITAMIN D) 1000 units tablet Take 1,000 Units daily by mouth.   . clopidogrel (PLAVIX) 75 MG tablet Take 75 mg by mouth daily.  . fexofenadine (ALLEGRA) 180 MG tablet Take 180 mg daily by mouth.   . fluticasone (FLONASE) 50 MCG/ACT nasal spray Place 2 sprays daily into both nostrils.   [name] levothyroxine (SYNTHROID, LEVOTHROID) 88 MCG tablet Take 88 mcg by  mouth daily before breakfast.  . Melatonin 10 MG CAPS Take 10 mg by mouth at bedtime.  . Multiple Vitamins-Minerals (ELDERTONIC PO) Take 15 mLs by mouth 2 (two) times daily.   . Multiple Vitamins-Minerals (MULTIVITAMIN WITH MINERALS) tablet Take 1 tablet by mouth daily.  . Multiple Vitamins-Minerals (PRESERVISION AREDS 2 PO) Take 1 capsule by mouth 2 (two) times daily.   [name] NUTRITIONAL SUPPLEMENT LIQD Take 120 mLs by mouth 3 (three) times  daily. MedPass   . Propylene Glycol (SYSTANE BALANCE) 0.6 % SOLN Apply 1 drop to eye 3 (three) times daily as needed (Irritation).  . sertraline (ZOLOFT) 50 MG tablet Take 50 mg by mouth daily.    No facility-administered encounter medications on file as of 01/31/2018.     Review of Systems  Unable to obtain due to dementia    Immunization History  Administered Date(s) Administered  . Influenza Split 06/25/2013  . Influenza, High Dose Seasonal PF 07/12/2014  . Influenza-Unspecified 06/12/2017  . Pneumococcal Conjugate-13 07/12/2014  . Pneumococcal Polysaccharide-23 06/13/2001  . Pneumococcal-Unspecified 07/13/1989  . Tdap 03/19/2003   Pertinent  Health Maintenance Due  Topic Date Due  . INFLUENZA VACCINE  03/30/2018  . PNA vac Low Risk Adult  Completed  . DEXA SCAN  Discontinued      Vitals:   01/31/18 1055  BP: 119/62  Pulse: 74  Resp: 16  Temp: (!) 97 F (36.1 C)  TempSrc: Oral  SpO2: 95%  Weight: 102 lb 6.4 oz (46.4 kg)  Height: 5\' 4"  (1.626 m)   Body mass index is 17.58 kg/m.  Physical Exam  GENERAL APPEARANCE:  In no acute distress.  SKIN:  Skin is warm and dry.  MOUTH and THROAT: Lips are without lesions. Oral mucosa is moist and without lesions. Tongue is normal in shape, size, and color and without lesions RESPIRATORY: Breathing is even & unlabored, BS CTAB CARDIAC: RRR, + murmur,no extra heart sounds, no edema GI: Abdomen soft, normal BS, no masses, no tenderness EXTREMITIES:  Able to move X 4 extremities, BLE has generalized weakness PSYCHIATRIC: Alert to self, disoriented to time and place.  Affect and behavior are appropriate   Labs reviewed: 01/19/18  tsh 15.64 Recent Labs    07/04/17 10/15/17 11/08/17  NA 140 135* 142  K 4.0 4.6 4.0  BUN 11 13 87*  CREATININE 0.9 0.8 0.6   Recent Labs    07/04/17 10/15/17  AST 20 18  ALT 15 10  ALKPHOS 66 75   Recent Labs    07/04/17 10/15/17 11/08/17  WBC 6.9 4.7 5.0  NEUTROABS 5 3 3   HGB 9.3*  11.4* 10.7*  HCT 28* 32* 31*  PLT 307 174 177   Lab Results  Component Value Date   TSH 5.95 (A) 12/05/2017    Lab Results  Component Value Date   CHOL 144 07/04/2017   HDL 32 (A) 07/04/2017   LDLCALC 88 07/04/2017    Assessment/Plan  1. Hypothyroidism due to acquired atrophy of thyroid -  tsh 15.64, recently started on Levothyroxine 88 mcg daily   2. Vitamin D deficiency - continue Vitamin D3 1,000 units daily   3. Allergic rhinitis, unspecified seasonality, unspecified trigger - stable, continue Fexofenadine 180 mg daily and Fluticasone 50 mcg instill 2 sprays into each nostril daily   4. Poor appetite - Body mass index is 17.58 kg/m. has gained 1.4 lbs in a month, will continue Eldertonic 15 ml BID   5. Depression, major, single episode, complete remission (  [facility]) - continue Sertraline 50 mg daily, followed by [facility] NP   6. Vascular dementia without behavioral disturbance - continue supportive care, fall precautions  7. Aortic valve stenosis - continue Plavix 75 mg daily     Family/ staff Communication: Discussed plan of care with charge nurse.  Labs/tests ordered:  None  Goals of care:   Long-term care.   Durenda Age, NP [facility] [phone] (Monday-Friday 8:00 a.m. - 5:00 p.m.) [phone] (after hours)

## 2018-02-02 DIAGNOSIS — R1311 Dysphagia, oral phase: Secondary | ICD-10-CM | POA: Diagnosis not present

## 2018-02-02 DIAGNOSIS — R293 Abnormal posture: Secondary | ICD-10-CM | POA: Diagnosis not present

## 2018-02-02 DIAGNOSIS — H93212 Auditory recruitment, left ear: Secondary | ICD-10-CM | POA: Diagnosis not present

## 2018-02-02 DIAGNOSIS — Z9181 History of falling: Secondary | ICD-10-CM | POA: Diagnosis not present

## 2018-02-02 DIAGNOSIS — I129 Hypertensive chronic kidney disease with stage 1 through stage 4 chronic kidney disease, or unspecified chronic kidney disease: Secondary | ICD-10-CM | POA: Diagnosis not present

## 2018-02-03 DIAGNOSIS — Z9181 History of falling: Secondary | ICD-10-CM | POA: Diagnosis not present

## 2018-02-03 DIAGNOSIS — I129 Hypertensive chronic kidney disease with stage 1 through stage 4 chronic kidney disease, or unspecified chronic kidney disease: Secondary | ICD-10-CM | POA: Diagnosis not present

## 2018-02-03 DIAGNOSIS — R1311 Dysphagia, oral phase: Secondary | ICD-10-CM | POA: Diagnosis not present

## 2018-02-03 DIAGNOSIS — H93212 Auditory recruitment, left ear: Secondary | ICD-10-CM | POA: Diagnosis not present

## 2018-02-03 DIAGNOSIS — R293 Abnormal posture: Secondary | ICD-10-CM | POA: Diagnosis not present

## 2018-02-06 DIAGNOSIS — I129 Hypertensive chronic kidney disease with stage 1 through stage 4 chronic kidney disease, or unspecified chronic kidney disease: Secondary | ICD-10-CM | POA: Diagnosis not present

## 2018-02-06 DIAGNOSIS — H93212 Auditory recruitment, left ear: Secondary | ICD-10-CM | POA: Diagnosis not present

## 2018-02-06 DIAGNOSIS — R293 Abnormal posture: Secondary | ICD-10-CM | POA: Diagnosis not present

## 2018-02-06 DIAGNOSIS — Z9181 History of falling: Secondary | ICD-10-CM | POA: Diagnosis not present

## 2018-02-06 DIAGNOSIS — R1311 Dysphagia, oral phase: Secondary | ICD-10-CM | POA: Diagnosis not present

## 2018-02-07 DIAGNOSIS — H93212 Auditory recruitment, left ear: Secondary | ICD-10-CM | POA: Diagnosis not present

## 2018-02-07 DIAGNOSIS — R1311 Dysphagia, oral phase: Secondary | ICD-10-CM | POA: Diagnosis not present

## 2018-02-07 DIAGNOSIS — I129 Hypertensive chronic kidney disease with stage 1 through stage 4 chronic kidney disease, or unspecified chronic kidney disease: Secondary | ICD-10-CM | POA: Diagnosis not present

## 2018-02-07 DIAGNOSIS — R293 Abnormal posture: Secondary | ICD-10-CM | POA: Diagnosis not present

## 2018-02-07 DIAGNOSIS — Z9181 History of falling: Secondary | ICD-10-CM | POA: Diagnosis not present

## 2018-02-08 DIAGNOSIS — H93212 Auditory recruitment, left ear: Secondary | ICD-10-CM | POA: Diagnosis not present

## 2018-02-08 DIAGNOSIS — R293 Abnormal posture: Secondary | ICD-10-CM | POA: Diagnosis not present

## 2018-02-08 DIAGNOSIS — I129 Hypertensive chronic kidney disease with stage 1 through stage 4 chronic kidney disease, or unspecified chronic kidney disease: Secondary | ICD-10-CM | POA: Diagnosis not present

## 2018-02-08 DIAGNOSIS — R1311 Dysphagia, oral phase: Secondary | ICD-10-CM | POA: Diagnosis not present

## 2018-02-08 DIAGNOSIS — Z9181 History of falling: Secondary | ICD-10-CM | POA: Diagnosis not present

## 2018-02-09 ENCOUNTER — Non-Acute Institutional Stay (SKILLED_NURSING_FACILITY): Payer: Medicare Other

## 2018-02-09 DIAGNOSIS — Z0000 Encounter for general adult medical examination without abnormal findings: Secondary | ICD-10-CM

## 2018-02-09 DIAGNOSIS — R293 Abnormal posture: Secondary | ICD-10-CM | POA: Diagnosis not present

## 2018-02-09 DIAGNOSIS — Z9181 History of falling: Secondary | ICD-10-CM | POA: Diagnosis not present

## 2018-02-09 DIAGNOSIS — R1311 Dysphagia, oral phase: Secondary | ICD-10-CM | POA: Diagnosis not present

## 2018-02-09 DIAGNOSIS — H93212 Auditory recruitment, left ear: Secondary | ICD-10-CM | POA: Diagnosis not present

## 2018-02-09 DIAGNOSIS — I129 Hypertensive chronic kidney disease with stage 1 through stage 4 chronic kidney disease, or unspecified chronic kidney disease: Secondary | ICD-10-CM | POA: Diagnosis not present

## 2018-02-09 NOTE — Patient Instructions (Addendum)
[name] , Thank you for taking time to come for your Medicare Wellness Visit. I appreciate your ongoing commitment to your health goals. Please review the following plan we discussed and let me know if I can assist you in the future.   Screening recommendations/referrals: Colonoscopy excluded, over age 82 Mammogram excluded, over age 15 Bone Density excluded Recommended yearly ophthalmology/optometry visit for glaucoma screening and checkup Recommended yearly dental visit for hygiene and checkup  Vaccinations: Influenza vaccine up to date, due 2019 fall season Pneumococcal vaccine up to date, completed Tdap vaccine due, ordered Shingles vaccine not in past records    Advanced directives: Need a copy of living will and health care power of attorney in chart  Conditions/risks identified: none  Next appointment: Dr. Linna Darner makes rounds   Preventive Care 38 Years and Older, Female Preventive care refers to lifestyle choices and visits with your health care provider that can promote health and wellness. What does preventive care include?  A yearly physical exam. This is also called an annual well check.  Dental exams once or twice a year.  Routine eye exams. Ask your health care provider how often you should have your eyes checked.  Personal lifestyle choices, including:  Daily care of your teeth and gums.  Regular physical activity.  Eating a healthy diet.  Avoiding tobacco and drug use.  Limiting alcohol use.  Practicing safe sex.  Taking low-dose aspirin every day.  Taking vitamin and mineral supplements as recommended by your health care provider. What happens during an annual well check? The services and screenings done by your health care provider during your annual well check will depend on your age, overall health, lifestyle risk factors, and family history of disease. Counseling  Your health care provider may ask you questions about your:  Alcohol  use.  Tobacco use.  Drug use.  Emotional well-being.  Home and relationship well-being.  Sexual activity.  Eating habits.  History of falls.  Memory and ability to understand (cognition).  Work and work Statistician.  Reproductive health. Screening  You may have the following tests or measurements:  Height, weight, and BMI.  Blood pressure.  Lipid and cholesterol levels. These may be checked every 5 years, or more frequently if you are over 16 years old.  Skin check.  Lung cancer screening. You may have this screening every year starting at age 35 if you have a 30-pack-year history of smoking and currently smoke or have quit within the past 15 years.  Fecal occult blood test (FOBT) of the stool. You may have this test every year starting at age 67.  Flexible sigmoidoscopy or colonoscopy. You may have a sigmoidoscopy every 5 years or a colonoscopy every 10 years starting at age 37.  Hepatitis C blood test.  Hepatitis B blood test.  Sexually transmitted disease (STD) testing.  Diabetes screening. This is done by checking your blood sugar (glucose) after you have not eaten for a while (fasting). You may have this done every 1-3 years.  Bone density scan. This is done to screen for osteoporosis. You may have this done starting at age 60.  Mammogram. This may be done every 1-2 years. Talk to your health care provider about how often you should have regular mammograms. Talk with your health care provider about your test results, treatment options, and if necessary, the need for more tests. Vaccines  Your health care provider may recommend certain vaccines, such as:  Influenza vaccine. This is recommended every year.  Tetanus, diphtheria, and acellular pertussis (Tdap, Td) vaccine. You may need a Td booster every 10 years.  Zoster vaccine. You may need this after age 11.  Pneumococcal 13-valent conjugate (PCV13) vaccine. One dose is recommended after age  6.  Pneumococcal polysaccharide (PPSV23) vaccine. One dose is recommended after age 69. Talk to your health care provider about which screenings and vaccines you need and how often you need them. This information is not intended to replace advice given to you by your health care provider. Make sure you discuss any questions you have with your health care provider. Document Released: 09/12/2015 Document Revised: 05/05/2016 Document Reviewed: 06/17/2015 Elsevier Interactive Patient Education  2017 [place] Prevention in the Home Falls can cause injuries. They can happen to people of all ages. There are many things you can do to make your home safe and to help prevent falls. What can I do on the outside of my home?  Regularly fix the edges of walkways and driveways and fix any cracks.  Remove anything that might make you trip as you walk through a door, such as a raised step or threshold.  Trim any bushes or trees on the path to your home.  Use bright outdoor lighting.  Clear any walking paths of anything that might make someone trip, such as rocks or tools.  Regularly check to see if handrails are loose or broken. Make sure that both sides of any steps have handrails.  Any raised decks and porches should have guardrails on the edges.  Have any leaves, snow, or ice cleared regularly.  Use sand or salt on walking paths during winter.  Clean up any spills in your garage right away. This includes oil or grease spills. What can I do in the bathroom?  Use night lights.  Install grab bars by the toilet and in the tub and shower. Do not use towel bars as grab bars.  Use non-skid mats or decals in the tub or shower.  If you need to sit down in the shower, use a plastic, non-slip stool.  Keep the floor dry. Clean up any water that spills on the floor as soon as it happens.  Remove soap buildup in the tub or shower regularly.  Attach bath mats securely with double-sided  non-slip rug tape.  Do not have throw rugs and other things on the floor that can make you trip. What can I do in the bedroom?  Use night lights.  Make sure that you have a light by your bed that is easy to reach.  Do not use any sheets or blankets that are too big for your bed. They should not hang down onto the floor.  Have a firm chair that has side arms. You can use this for support while you get dressed.  Do not have throw rugs and other things on the floor that can make you trip. What can I do in the kitchen?  Clean up any spills right away.  Avoid walking on wet floors.  Keep items that you use a lot in easy-to-reach places.  If you need to reach something above you, use a strong step stool that has a grab bar.  Keep electrical cords out of the way.  Do not use floor polish or wax that makes floors slippery. If you must use wax, use non-skid floor wax.  Do not have throw rugs and other things on the floor that can make you trip. What can I do  with my stairs?  Do not leave any items on the stairs.  Make sure that there are handrails on both sides of the stairs and use them. Fix handrails that are broken or loose. Make sure that handrails are as long as the stairways.  Check any carpeting to make sure that it is firmly attached to the stairs. Fix any carpet that is loose or worn.  Avoid having throw rugs at the top or bottom of the stairs. If you do have throw rugs, attach them to the floor with carpet tape.  Make sure that you have a light switch at the top of the stairs and the bottom of the stairs. If you do not have them, ask someone to add them for you. What else can I do to help prevent falls?  Wear shoes that:  Do not have high heels.  Have rubber bottoms.  Are comfortable and fit you well.  Are closed at the toe. Do not wear sandals.  If you use a stepladder:  Make sure that it is fully opened. Do not climb a closed stepladder.  Make sure that both  sides of the stepladder are locked into place.  Ask someone to hold it for you, if possible.  Clearly mark and make sure that you can see:  Any grab bars or handrails.  First and last steps.  Where the edge of each step is.  Use tools that help you move around (mobility aids) if they are needed. These include:  Canes.  Walkers.  Scooters.  Crutches.  Turn on the lights when you go into a dark area. Replace any light bulbs as soon as they burn out.  Set up your furniture so you have a clear path. Avoid moving your furniture around.  If any of your floors are uneven, fix them.  If there are any pets around you, be aware of where they are.  Review your medicines with your doctor. Some medicines can make you feel dizzy. This can increase your chance of falling. Ask your doctor what other things that you can do to help prevent falls. This information is not intended to replace advice given to you by your health care provider. Make sure you discuss any questions you have with your health care provider. Document Released: 06/12/2009 Document Revised: 01/22/2016 Document Reviewed: 09/20/2014 Elsevier Interactive Patient Education  2017 [employer].

## 2018-02-09 NOTE — Progress Notes (Addendum)
Subjective:   Jacqueline Robles is a 84 y.o. female who presents for Medicare Annual (Subsequent) preventive examination at [place]; incapacitated patient unable to answer questions appropriately   Last AWv-01/26/2018    Objective:     Vitals: BP 130/78 (BP Location: Left Arm, Patient Position: Supine)   Pulse (!) 57   Temp (!) 97.3 F (36.3 C) (Oral)   SpO2 97%   There is no height or weight on file to calculate BMI.  Advanced Directives 02/09/2018 01/31/2018 01/20/2018 12/29/2017 11/24/2017 11/16/2017 11/07/2017  Does Patient Have a Medical Advance Directive? Yes - Yes Yes - Yes Yes  Type of Advance Directive Out of facility DNR (pink MOST or yellow form) Out of facility DNR (pink MOST or yellow form) Out of facility DNR (pink MOST or yellow form) Out of facility DNR (pink MOST or yellow form) Out of facility DNR (pink MOST or yellow form) Out of facility DNR (pink MOST or yellow form) Out of facility DNR (pink MOST or yellow form)  Does patient want to make changes to medical advance directive? No - Patient declined No - Patient declined No - Patient declined No - Patient declined Yes (Inpatient - patient requests chaplain consult to change a medical advance directive) No - Patient declined No - Patient declined  Would patient like information on creating a medical advance directive? - - - - - - -  Pre-existing out of facility DNR order (yellow form or pink MOST form) Pink MOST form placed in chart (order not valid for inpatient use) - Pink MOST form placed in chart (order not valid for inpatient use) - - - -    Tobacco Social History   Tobacco Use  Smoking Status Never Smoker  Smokeless Tobacco Never Used     Counseling given: Not Answered   Clinical Intake:  Pre-visit preparation completed: No  Pain : No/denies pain     Nutritional Risks: None Diabetes: No  How often do you need to have someone help you when you read instructions, pamphlets, or other written  materials from your doctor or pharmacy?: 3 - Sometimes  Interpreter Needed?: No  Information entered by :: Tyson Dense, RN  Past Medical History:  Diagnosis Date  . C. difficile colitis   . Depression   . Hyperlipidemia   . Hypertension   . Osteoporosis   . Thyroid disease   . UTI (lower urinary tract infection)    Past Surgical History:  Procedure Laterality Date  . FOOT SURGERY    . neck surgery     Family History  Problem Relation Age of Onset  . Hypertension Son    Social History   Socioeconomic History  . Marital status: Widowed    Spouse name: Not on file  . Number of children: Not on file  . Years of education: Not on file  . Highest education level: Not on file  Occupational History  . Not on file  Social Needs  . Financial resource strain: Not hard at all  . Food insecurity:    Worry: Never true    Inability: Never true  . Transportation needs:    Medical: No    Non-medical: No  Tobacco Use  . Smoking status: Never Smoker  . Smokeless tobacco: Never Used  Substance and Sexual Activity  . Alcohol use: No  . Drug use: No  . Sexual activity: Not on file  Lifestyle  . Physical activity:    Days per week:  0 days    Minutes per session: 0 min  . Stress: Not at all  Relationships  . Social connections:    Talks on phone: Twice a week    Gets together: Twice a week    Attends religious service: Never    Active member of club or organization: No    Attends meetings of clubs or organizations: Never    Relationship status: Widowed  Other Topics Concern  . Not on file  Social History Narrative   Family is very involved in care.    Outpatient Encounter Medications as of 02/09/2018  Medication Sig  . acetaminophen (TYLENOL) 500 MG tablet Take 1,000 mg by mouth 2 (two) times daily as needed for moderate pain.   . Calcium Carbonate-Vitamin D3 (CALCIUM 600-D) 600-400 MG-UNIT TABS Take 1 each by mouth daily.  . cholecalciferol (VITAMIN D) 1000 units  tablet Take 1,000 Units daily by mouth.   . clopidogrel (PLAVIX) 75 MG tablet Take 75 mg by mouth daily.  . fexofenadine (ALLEGRA) 180 MG tablet Take 180 mg daily by mouth.   . fluticasone (FLONASE) 50 MCG/ACT nasal spray Place 2 sprays daily into both nostrils.   [name] levothyroxine (SYNTHROID, LEVOTHROID) 88 MCG tablet Take 88 mcg by mouth daily before breakfast.  . Melatonin 10 MG CAPS Take 10 mg by mouth at bedtime.  . Multiple Vitamins-Minerals (ELDERTONIC PO) Take 15 mLs by mouth 2 (two) times daily.   . Multiple Vitamins-Minerals (MULTIVITAMIN WITH MINERALS) tablet Take 1 tablet by mouth daily.  . Multiple Vitamins-Minerals (PRESERVISION AREDS 2 PO) Take 1 capsule by mouth 2 (two) times daily.   [name] NUTRITIONAL SUPPLEMENT LIQD Take 120 mLs by mouth 3 (three) times daily. Med[employer]   . Propylene Glycol (SYSTANE BALANCE) 0.6 % SOLN Apply 1 drop to eye 3 (three) times daily as needed (Irritation).  . sertraline (ZOLOFT) 50 MG tablet Take 50 mg by mouth daily.    No facility-administered encounter medications on file as of 02/09/2018.     Activities of Daily Living In your present state of health, do you have any difficulty performing the following activities: 02/09/2018  Hearing? Y  Vision? N  Difficulty concentrating or making decisions? Y  Walking or climbing stairs? Y  Dressing or bathing? Y  Doing errands, shopping? Y  Preparing Food and eating ? Y  Using the Toilet? Y  In the past six months, have you accidently leaked urine? Y  Do you have problems with loss of bowel control? Y  Managing your Medications? Y  Managing your Finances? Y  Housekeeping or managing your Housekeeping? Y  Some recent data might be hidden    Patient Care Team: Hendricks Limes, MD as PCP - General (Internal Medicine) Medina-Vargas, [name], NP as Nurse Practitioner (Internal Medicine)    Assessment:   This is a routine wellness examination for Jahlisa.  Exercise Activities and Dietary  recommendations Current Exercise Habits: The patient does not participate in regular exercise at present, Exercise limited by: neurologic condition(s);orthopedic condition(s)  Goals    None      Fall Risk Fall Risk  02/09/2018  Falls in the past year? No   Is the patient's home free of loose throw rugs in walkways, pet beds, electrical cords, etc?   yes      Grab bars in the bathroom? yes      Handrails on the stairs?   yes      Adequate lighting?   yes   Depression Screen  PHQ 2/9 Scores 02/09/2018  PHQ - 2 Score 0     Cognitive Function     6CIT Screen 02/09/2018  What Year? 4 points  What month? 3 points  What time? 3 points  Count back from 20 4 points  Months in reverse 4 points  Repeat phrase 10 points  Total Score 28    Immunization History  Administered Date(s) Administered  . Influenza Split 06/25/2013  . Influenza, High Dose Seasonal PF 07/12/2014  . Influenza-Unspecified 06/12/2017  . Pneumococcal Conjugate-13 07/12/2014  . Pneumococcal Polysaccharide-23 06/13/2001  . Pneumococcal-Unspecified 07/13/1989  . Tdap 03/19/2003    Qualifies for Shingles Vaccine? Not in past records  Screening Tests Health Maintenance  Topic Date Due  . TETANUS/TDAP  03/18/2023 (Originally 03/18/2013)  . INFLUENZA VACCINE  03/30/2018  . PNA vac Low Risk Adult  Completed  . DEXA SCAN  Discontinued    Cancer Screenings: Lung: Low Dose CT Chest recommended if Age 11-80 years, 30 pack-year currently smoking OR have quit w/in 15years. Patient does not qualify. Breast:  Up to date on Mammogram? excluded Up to date of Bone Density/Dexa? excluded Colorectal: excluded  Additional Screenings:  Hepatitis C Screening:  TDAP due-ordered    Plan:  I have personally reviewed and addressed the Medicare Annual Wellness questionnaire and have noted the following in the patient's chart:  A. Medical and social history B. Use of alcohol, tobacco or illicit drugs  C. Current  medications and supplements D. Functional ability and status E.  Nutritional status F.  Physical activity G. Advance directives H. List of other physicians I.  Hospitalizations, surgeries, and ER visits in previous 12 months J.  [place] to include hearing, vision, cognitive, depression L. Referrals and appointments - none  In addition, I am unable to review and discuss with incapacitated patient certain preventive protocols, quality metrics, and best practice recommendations. A written personalized care plan for preventive services as well as general preventive health recommendations were provided to patient.   See attached scanned questionnaire for additional information.   Signed,   Tyson Dense, RN Nurse Health Advisor  Patient Concerns: None I have personally reviewed the health advisor's clinical note, was available for consultation, and agree with the assessment and plan as written. Hendricks Limes M.D., FACP, [facility]

## 2018-02-10 DIAGNOSIS — Z9181 History of falling: Secondary | ICD-10-CM | POA: Diagnosis not present

## 2018-02-10 DIAGNOSIS — R293 Abnormal posture: Secondary | ICD-10-CM | POA: Diagnosis not present

## 2018-02-10 DIAGNOSIS — H93212 Auditory recruitment, left ear: Secondary | ICD-10-CM | POA: Diagnosis not present

## 2018-02-10 DIAGNOSIS — I129 Hypertensive chronic kidney disease with stage 1 through stage 4 chronic kidney disease, or unspecified chronic kidney disease: Secondary | ICD-10-CM | POA: Diagnosis not present

## 2018-02-10 DIAGNOSIS — R1311 Dysphagia, oral phase: Secondary | ICD-10-CM | POA: Diagnosis not present

## 2018-02-11 DIAGNOSIS — I129 Hypertensive chronic kidney disease with stage 1 through stage 4 chronic kidney disease, or unspecified chronic kidney disease: Secondary | ICD-10-CM | POA: Diagnosis not present

## 2018-02-11 DIAGNOSIS — R1311 Dysphagia, oral phase: Secondary | ICD-10-CM | POA: Diagnosis not present

## 2018-02-11 DIAGNOSIS — R293 Abnormal posture: Secondary | ICD-10-CM | POA: Diagnosis not present

## 2018-02-11 DIAGNOSIS — H93212 Auditory recruitment, left ear: Secondary | ICD-10-CM | POA: Diagnosis not present

## 2018-02-11 DIAGNOSIS — Z9181 History of falling: Secondary | ICD-10-CM | POA: Diagnosis not present

## 2018-02-13 DIAGNOSIS — R293 Abnormal posture: Secondary | ICD-10-CM | POA: Diagnosis not present

## 2018-02-13 DIAGNOSIS — Z9181 History of falling: Secondary | ICD-10-CM | POA: Diagnosis not present

## 2018-02-13 DIAGNOSIS — R1311 Dysphagia, oral phase: Secondary | ICD-10-CM | POA: Diagnosis not present

## 2018-02-13 DIAGNOSIS — I129 Hypertensive chronic kidney disease with stage 1 through stage 4 chronic kidney disease, or unspecified chronic kidney disease: Secondary | ICD-10-CM | POA: Diagnosis not present

## 2018-02-13 DIAGNOSIS — H93212 Auditory recruitment, left ear: Secondary | ICD-10-CM | POA: Diagnosis not present

## 2018-02-14 DIAGNOSIS — R293 Abnormal posture: Secondary | ICD-10-CM | POA: Diagnosis not present

## 2018-02-14 DIAGNOSIS — R1311 Dysphagia, oral phase: Secondary | ICD-10-CM | POA: Diagnosis not present

## 2018-02-14 DIAGNOSIS — I129 Hypertensive chronic kidney disease with stage 1 through stage 4 chronic kidney disease, or unspecified chronic kidney disease: Secondary | ICD-10-CM | POA: Diagnosis not present

## 2018-02-14 DIAGNOSIS — Z9181 History of falling: Secondary | ICD-10-CM | POA: Diagnosis not present

## 2018-02-14 DIAGNOSIS — H93212 Auditory recruitment, left ear: Secondary | ICD-10-CM | POA: Diagnosis not present

## 2018-02-15 DIAGNOSIS — R293 Abnormal posture: Secondary | ICD-10-CM | POA: Diagnosis not present

## 2018-02-15 DIAGNOSIS — I129 Hypertensive chronic kidney disease with stage 1 through stage 4 chronic kidney disease, or unspecified chronic kidney disease: Secondary | ICD-10-CM | POA: Diagnosis not present

## 2018-02-15 DIAGNOSIS — R1311 Dysphagia, oral phase: Secondary | ICD-10-CM | POA: Diagnosis not present

## 2018-02-15 DIAGNOSIS — Z9181 History of falling: Secondary | ICD-10-CM | POA: Diagnosis not present

## 2018-02-15 DIAGNOSIS — H93212 Auditory recruitment, left ear: Secondary | ICD-10-CM | POA: Diagnosis not present

## 2018-02-16 DIAGNOSIS — Z9181 History of falling: Secondary | ICD-10-CM | POA: Diagnosis not present

## 2018-02-16 DIAGNOSIS — I129 Hypertensive chronic kidney disease with stage 1 through stage 4 chronic kidney disease, or unspecified chronic kidney disease: Secondary | ICD-10-CM | POA: Diagnosis not present

## 2018-02-16 DIAGNOSIS — H93212 Auditory recruitment, left ear: Secondary | ICD-10-CM | POA: Diagnosis not present

## 2018-02-16 DIAGNOSIS — R293 Abnormal posture: Secondary | ICD-10-CM | POA: Diagnosis not present

## 2018-02-16 DIAGNOSIS — R1311 Dysphagia, oral phase: Secondary | ICD-10-CM | POA: Diagnosis not present

## 2018-02-17 DIAGNOSIS — H93212 Auditory recruitment, left ear: Secondary | ICD-10-CM | POA: Diagnosis not present

## 2018-02-17 DIAGNOSIS — I129 Hypertensive chronic kidney disease with stage 1 through stage 4 chronic kidney disease, or unspecified chronic kidney disease: Secondary | ICD-10-CM | POA: Diagnosis not present

## 2018-02-17 DIAGNOSIS — R293 Abnormal posture: Secondary | ICD-10-CM | POA: Diagnosis not present

## 2018-02-17 DIAGNOSIS — Z9181 History of falling: Secondary | ICD-10-CM | POA: Diagnosis not present

## 2018-02-17 DIAGNOSIS — R1311 Dysphagia, oral phase: Secondary | ICD-10-CM | POA: Diagnosis not present

## 2018-02-20 DIAGNOSIS — H93212 Auditory recruitment, left ear: Secondary | ICD-10-CM | POA: Diagnosis not present

## 2018-02-20 DIAGNOSIS — Z9181 History of falling: Secondary | ICD-10-CM | POA: Diagnosis not present

## 2018-02-20 DIAGNOSIS — I129 Hypertensive chronic kidney disease with stage 1 through stage 4 chronic kidney disease, or unspecified chronic kidney disease: Secondary | ICD-10-CM | POA: Diagnosis not present

## 2018-02-20 DIAGNOSIS — R293 Abnormal posture: Secondary | ICD-10-CM | POA: Diagnosis not present

## 2018-02-20 DIAGNOSIS — R1311 Dysphagia, oral phase: Secondary | ICD-10-CM | POA: Diagnosis not present

## 2018-02-21 DIAGNOSIS — R1311 Dysphagia, oral phase: Secondary | ICD-10-CM | POA: Diagnosis not present

## 2018-02-21 DIAGNOSIS — Z9181 History of falling: Secondary | ICD-10-CM | POA: Diagnosis not present

## 2018-02-21 DIAGNOSIS — I129 Hypertensive chronic kidney disease with stage 1 through stage 4 chronic kidney disease, or unspecified chronic kidney disease: Secondary | ICD-10-CM | POA: Diagnosis not present

## 2018-02-21 DIAGNOSIS — R293 Abnormal posture: Secondary | ICD-10-CM | POA: Diagnosis not present

## 2018-02-21 DIAGNOSIS — H93212 Auditory recruitment, left ear: Secondary | ICD-10-CM | POA: Diagnosis not present

## 2018-02-24 DIAGNOSIS — R262 Difficulty in walking, not elsewhere classified: Secondary | ICD-10-CM | POA: Diagnosis not present

## 2018-02-24 DIAGNOSIS — B351 Tinea unguium: Secondary | ICD-10-CM | POA: Diagnosis not present

## 2018-02-24 DIAGNOSIS — I739 Peripheral vascular disease, unspecified: Secondary | ICD-10-CM | POA: Diagnosis not present

## 2018-02-24 LAB — HM DIABETES FOOT EXAM

## 2018-02-28 ENCOUNTER — Non-Acute Institutional Stay (SKILLED_NURSING_FACILITY): Payer: Medicare Other | Admitting: Internal Medicine

## 2018-02-28 ENCOUNTER — Encounter: Payer: Self-pay | Admitting: Internal Medicine

## 2018-02-28 DIAGNOSIS — R627 Adult failure to thrive: Secondary | ICD-10-CM | POA: Diagnosis not present

## 2018-02-28 DIAGNOSIS — I959 Hypotension, unspecified: Secondary | ICD-10-CM

## 2018-02-28 DIAGNOSIS — E034 Atrophy of thyroid (acquired): Secondary | ICD-10-CM | POA: Diagnosis not present

## 2018-02-28 DIAGNOSIS — G4700 Insomnia, unspecified: Secondary | ICD-10-CM | POA: Diagnosis not present

## 2018-02-28 DIAGNOSIS — F325 Major depressive disorder, single episode, in full remission: Secondary | ICD-10-CM | POA: Diagnosis not present

## 2018-02-28 NOTE — Assessment & Plan Note (Addendum)
12/05/17 TSH 5.95 on 75 g of L-thyroxine daily 01/19/18 TSH 15.64, L-thyroxine increased to 88 g on 01/31/18 TSH should be rechecked 03/20/18, approximately 6 weeks after the increase in dose

## 2018-02-28 NOTE — Patient Instructions (Signed)
See assessment and plan under each diagnosis in the problem list and acutely for this visit 

## 2018-02-28 NOTE — Progress Notes (Signed)
    NURSING HOME LOCATION:  [facility] ROOM NUMBER:  [id number]  CODE STATUS:  DNR  PCP:  Hendricks Limes, MD  [place] [address]  This is a nursing facility follow up of chronic medical diagnoses.    Interim medical record and care since last [place] visit was updated with review of diagnostic studies and change in clinical status since last visit were documented.  HPI: The patient is a permanent resident of the facility with medical diagnoses of adult failure to thrive, recurrent falls, depression, vascular dementia, history of SIADH, hypothyroidism, and aortic stenosis. Labs are not current, lab monitor is not appropriate due to her DO NOT RESUSCITATE status. She does have a history of SIADH, but the sodium was normal in March. Her TSH was mildly elevated at 5.95 on 12/05/17 on 75 g of L-thyroxine daily. On 01/19/18 TSH was 15.64. L-thyroxine was increased to 88 g on 01/31/18.  Review of systems: Dementia prevented completion. She probably fell out of a wheelchair last week sustaining a bruise to the left forehead. She was seen and evaluated. No significant injury documented. The patient is oblivious to the event.   Physical exam:  Pertinent or positive findings: Patient is somnolent. Her responses are monosyllabic and unintelligible. She will nod her head but it has nothing to do with the question. She has resolving ecchymosis and eschar over the left forehead. Ptosis is present, greater on the left than the right. She has been mild osteoarthritic changes in the hands. Pedal pulses are decreased. Her limbs are thin.  General appearance: no acute distress, increased work of breathing is present.   Lymphatic: No lymphadenopathy about the head, neck, axilla. Eyes: No conjunctival inflammation or lid edema is present. There is no scleral icterus. Ears:  External ear exam shows no significant lesions or deformities.   Nose:  External nasal examination shows no  deformity or inflammation. Nasal mucosa are pink and moist without lesions, exudates Oral exam:  Lips and gums are healthy appearing. There is no oropharyngeal erythema or exudate. Neck:  No thyromegaly, masses, tenderness noted.    Heart:  Normal rate and regular rhythm. S1 and S2 normal without gallop, murmur, click, rub .  Lungs: Chest clear to auscultation without wheezes, rhonchi, rales, rubs. Abdomen: Bowel sounds are normal. Abdomen is soft and nontender with no organomegaly, hernias, masses. GU: Deferred  Extremities:  No cyanosis, clubbing, edema  Skin: Warm & dry w/o tenting. No significant rash.  See summary under each active problem in the Problem List with associated updated therapeutic plan

## 2018-02-28 NOTE — Assessment & Plan Note (Addendum)
Her weight has been stable or increasing minimally. Sertraline may have been contributing factor to stabilization of weight; but progressive hypothyroidism must be ruled out as the cause.

## 2018-03-02 ENCOUNTER — Encounter: Payer: Self-pay | Admitting: Internal Medicine

## 2018-03-21 DIAGNOSIS — I10 Essential (primary) hypertension: Secondary | ICD-10-CM | POA: Diagnosis not present

## 2018-03-21 DIAGNOSIS — G4700 Insomnia, unspecified: Secondary | ICD-10-CM | POA: Diagnosis not present

## 2018-03-21 DIAGNOSIS — E039 Hypothyroidism, unspecified: Secondary | ICD-10-CM | POA: Diagnosis not present

## 2018-03-21 DIAGNOSIS — F325 Major depressive disorder, single episode, in full remission: Secondary | ICD-10-CM | POA: Diagnosis not present

## 2018-03-21 DIAGNOSIS — D649 Anemia, unspecified: Secondary | ICD-10-CM | POA: Diagnosis not present

## 2018-03-21 LAB — CBC AND DIFFERENTIAL
HCT: 26 — AB (ref 36–46)
Hemoglobin: 8.8 — AB (ref 12.0–16.0)
NEUTROS ABS: 3
PLATELETS: 179 (ref 150–399)
WBC: 5.3

## 2018-03-21 LAB — TSH: TSH: 5.51 (ref 0.41–5.90)

## 2018-03-30 ENCOUNTER — Encounter: Payer: Self-pay | Admitting: Internal Medicine

## 2018-03-30 ENCOUNTER — Non-Acute Institutional Stay (SKILLED_NURSING_FACILITY): Payer: Medicare Other | Admitting: Internal Medicine

## 2018-03-30 DIAGNOSIS — E034 Atrophy of thyroid (acquired): Secondary | ICD-10-CM

## 2018-03-30 DIAGNOSIS — D649 Anemia, unspecified: Secondary | ICD-10-CM | POA: Diagnosis not present

## 2018-03-30 NOTE — Progress Notes (Signed)
   NURSING HOME LOCATION:  [facility] ROOM NUMBER:  [id number]  CODE STATUS:   DNR  PCP:  Unice Cobble, MD  This is a nursing facility follow up of chronic medical diagnoses  Interim medical record and care since last [place] visit was updated with review of diagnostic studies and change in clinical status since last visit were documented.  HPI: The patient is a resident of facility with medical diagnoses of aortic stenosis, hypothyroidism, vascular dementia, depression, and adult failure to thrive. She also has a history of SIADH.  L-thyroxine dose had been increased due to a rise of her TSH to 15.64. TSH was high normal value of 5.51 on 03/21/18.  She recently had dental surgery; the Plavix was not discontinued. She did not have any reported significant perioperative bleeding. Despite this there has been a significant drop in her hematocrit. In March hemoglobin was 10.7 & hematocrit 31. On 7/23 hemoglobin was 8.8 and hematocrit 26.  She is on 50 mg of Zoloft for depression and appetite stimulation.  Review of systems: Dementia precluded completion of review systems. When asked questions she simply shakes her head up and down and moans . There is no intelligible speech.  Physical exam:  Pertinent or positive findings: She does appear pale. She has a grade 1.5 systolic murmur. She has minor rales at the bases. She has 1+ pedal edema. Pedal pulses are decreased. She can't follow commands. Her skin is cool to the touch.  General appearance: no acute distress, increased work of breathing is present.   Lymphatic: No lymphadenopathy about the head, neck, axilla. Eyes: No conjunctival inflammation or lid edema is present. There is no scleral icterus. Ears:  External ear exam shows no significant lesions or deformities.   Nose:  External nasal examination shows no deformity or inflammation. Nasal mucosa are pink and moist without lesions, exudates Oral exam:  Lips and gums are  healthy appearing. There is no oropharyngeal erythema or exudate. Neck:  No thyromegaly, masses, tenderness noted.    Heart:  Normal rate and regular rhythm. S1 and S2 normal without gallop, click, rub .  Lungs:  without wheezes, rhonchi, rubs. Abdomen: Bowel sounds are normal. Abdomen is soft and nontender with no organomegaly, hernias, masses. GU: Deferred  Extremities:  No cyanosis, clubbing, edema  Neurologic exam : Balance, Rhomberg, finger to nose testing could not be completed due to clinical state Skin:  dry w/o tenting. No significant lesions or rash.  See summary under each active problem in the Problem List with associated updated therapeutic plan

## 2018-03-30 NOTE — Assessment & Plan Note (Addendum)
03/21/18  TSH high  Normal; no change in dose; monitor every 4-6 months

## 2018-03-30 NOTE — Assessment & Plan Note (Addendum)
Significant drop post dental procedure Recheck CBC and dif

## 2018-03-31 DIAGNOSIS — Z79899 Other long term (current) drug therapy: Secondary | ICD-10-CM | POA: Diagnosis not present

## 2018-03-31 DIAGNOSIS — D649 Anemia, unspecified: Secondary | ICD-10-CM | POA: Diagnosis not present

## 2018-03-31 LAB — BASIC METABOLIC PANEL
BUN: 13 (ref 4–21)
Creatinine: 0.7 (ref 0.5–1.1)
Glucose: 83
Potassium: 3.4 (ref 3.4–5.3)
Sodium: 140 (ref 137–147)

## 2018-03-31 LAB — CBC AND DIFFERENTIAL
HCT: 26 — AB (ref 36–46)
Hemoglobin: 8.8 — AB (ref 12.0–16.0)
NEUTROS ABS: 4
PLATELETS: 199 (ref 150–399)
WBC: 6.7

## 2018-03-31 LAB — IRON,TIBC AND FERRITIN PANEL
%SAT: 18.73
Iron: 27
TIBC: 145
UIBC: 118

## 2018-03-31 LAB — VITAMIN B12: Vitamin B-12: 448

## 2018-03-31 NOTE — Patient Instructions (Signed)
See assessment and plan under each diagnosis in the problem list and acutely for this visit 

## 2018-04-06 DIAGNOSIS — Z79899 Other long term (current) drug therapy: Secondary | ICD-10-CM | POA: Diagnosis not present

## 2018-04-06 DIAGNOSIS — E039 Hypothyroidism, unspecified: Secondary | ICD-10-CM | POA: Diagnosis not present

## 2018-04-06 DIAGNOSIS — D649 Anemia, unspecified: Secondary | ICD-10-CM | POA: Diagnosis not present

## 2018-04-06 DIAGNOSIS — I10 Essential (primary) hypertension: Secondary | ICD-10-CM | POA: Diagnosis not present

## 2018-04-06 LAB — CBC AND DIFFERENTIAL
HCT: 26 — AB (ref 36–46)
Hemoglobin: 8.9 — AB (ref 12.0–16.0)
Neutrophils Absolute: 3
Platelets: 192 (ref 150–399)
WBC: 5.4

## 2018-04-11 DIAGNOSIS — R600 Localized edema: Secondary | ICD-10-CM | POA: Diagnosis not present

## 2018-04-12 DIAGNOSIS — D72829 Elevated white blood cell count, unspecified: Secondary | ICD-10-CM | POA: Diagnosis not present

## 2018-04-12 DIAGNOSIS — D649 Anemia, unspecified: Secondary | ICD-10-CM | POA: Diagnosis not present

## 2018-04-12 LAB — CBC AND DIFFERENTIAL
HCT: 26 — AB (ref 36–46)
Hemoglobin: 8.4 — AB (ref 12.0–16.0)
NEUTROS ABS: 12
PLATELETS: 194 (ref 150–399)
WBC: 13.7

## 2018-04-13 ENCOUNTER — Encounter: Payer: Self-pay | Admitting: Internal Medicine

## 2018-04-13 DIAGNOSIS — N390 Urinary tract infection, site not specified: Secondary | ICD-10-CM | POA: Diagnosis not present

## 2018-04-13 DIAGNOSIS — Z79899 Other long term (current) drug therapy: Secondary | ICD-10-CM | POA: Diagnosis not present

## 2018-04-13 DIAGNOSIS — R9389 Abnormal findings on diagnostic imaging of other specified body structures: Secondary | ICD-10-CM | POA: Insufficient documentation

## 2018-04-13 DIAGNOSIS — R319 Hematuria, unspecified: Secondary | ICD-10-CM | POA: Diagnosis not present

## 2018-04-14 DIAGNOSIS — R404 Transient alteration of awareness: Secondary | ICD-10-CM | POA: Diagnosis not present

## 2018-04-14 DIAGNOSIS — R001 Bradycardia, unspecified: Secondary | ICD-10-CM | POA: Diagnosis not present

## 2018-04-14 DIAGNOSIS — I491 Atrial premature depolarization: Secondary | ICD-10-CM | POA: Diagnosis not present

## 2018-04-14 DIAGNOSIS — R509 Fever, unspecified: Secondary | ICD-10-CM | POA: Diagnosis not present

## 2018-04-15 ENCOUNTER — Emergency Department (HOSPITAL_COMMUNITY): Payer: Medicare Other

## 2018-04-15 ENCOUNTER — Inpatient Hospital Stay (HOSPITAL_COMMUNITY)
Admission: EM | Admit: 2018-04-15 | Discharge: 2018-04-19 | DRG: 871 | Disposition: A | Discharge status: Skilled Nursing Facility | Payer: Medicare Other | Source: Skilled Nursing Facility | Attending: Internal Medicine | Admitting: Internal Medicine

## 2018-04-15 ENCOUNTER — Encounter (HOSPITAL_COMMUNITY): Payer: Self-pay | Admitting: Emergency Medicine

## 2018-04-15 ENCOUNTER — Other Ambulatory Visit: Payer: Self-pay

## 2018-04-15 DIAGNOSIS — E034 Atrophy of thyroid (acquired): Secondary | ICD-10-CM

## 2018-04-15 DIAGNOSIS — I4581 Long QT syndrome: Secondary | ICD-10-CM | POA: Diagnosis not present

## 2018-04-15 DIAGNOSIS — R001 Bradycardia, unspecified: Secondary | ICD-10-CM | POA: Diagnosis not present

## 2018-04-15 DIAGNOSIS — R6520 Severe sepsis without septic shock: Secondary | ICD-10-CM | POA: Diagnosis present

## 2018-04-15 DIAGNOSIS — E785 Hyperlipidemia, unspecified: Secondary | ICD-10-CM | POA: Diagnosis present

## 2018-04-15 DIAGNOSIS — F329 Major depressive disorder, single episode, unspecified: Secondary | ICD-10-CM | POA: Diagnosis present

## 2018-04-15 DIAGNOSIS — Z79890 Hormone replacement therapy (postmenopausal): Secondary | ICD-10-CM

## 2018-04-15 DIAGNOSIS — Z7189 Other specified counseling: Secondary | ICD-10-CM | POA: Diagnosis not present

## 2018-04-15 DIAGNOSIS — I493 Ventricular premature depolarization: Secondary | ICD-10-CM | POA: Diagnosis present

## 2018-04-15 DIAGNOSIS — I452 Bifascicular block: Secondary | ICD-10-CM | POA: Diagnosis present

## 2018-04-15 DIAGNOSIS — N390 Urinary tract infection, site not specified: Secondary | ICD-10-CM | POA: Diagnosis present

## 2018-04-15 DIAGNOSIS — Z886 Allergy status to analgesic agent status: Secondary | ICD-10-CM

## 2018-04-15 DIAGNOSIS — A419 Sepsis, unspecified organism: Secondary | ICD-10-CM | POA: Diagnosis not present

## 2018-04-15 DIAGNOSIS — Z7902 Long term (current) use of antithrombotics/antiplatelets: Secondary | ICD-10-CM

## 2018-04-15 DIAGNOSIS — R319 Hematuria, unspecified: Secondary | ICD-10-CM | POA: Diagnosis not present

## 2018-04-15 DIAGNOSIS — I350 Nonrheumatic aortic (valve) stenosis: Secondary | ICD-10-CM

## 2018-04-15 DIAGNOSIS — Z7401 Bed confinement status: Secondary | ICD-10-CM | POA: Diagnosis not present

## 2018-04-15 DIAGNOSIS — L89152 Pressure ulcer of sacral region, stage 2: Secondary | ICD-10-CM | POA: Diagnosis not present

## 2018-04-15 DIAGNOSIS — Z515 Encounter for palliative care: Secondary | ICD-10-CM | POA: Diagnosis not present

## 2018-04-15 DIAGNOSIS — M2550 Pain in unspecified joint: Secondary | ICD-10-CM | POA: Diagnosis not present

## 2018-04-15 DIAGNOSIS — R402142 Coma scale, eyes open, spontaneous, at arrival to emergency department: Secondary | ICD-10-CM | POA: Diagnosis present

## 2018-04-15 DIAGNOSIS — R402252 Coma scale, best verbal response, oriented, at arrival to emergency department: Secondary | ICD-10-CM | POA: Diagnosis present

## 2018-04-15 DIAGNOSIS — I4729 Other ventricular tachycardia: Secondary | ICD-10-CM

## 2018-04-15 DIAGNOSIS — D509 Iron deficiency anemia, unspecified: Secondary | ICD-10-CM | POA: Diagnosis present

## 2018-04-15 DIAGNOSIS — A4151 Sepsis due to Escherichia coli [E. coli]: Secondary | ICD-10-CM | POA: Diagnosis not present

## 2018-04-15 DIAGNOSIS — E876 Hypokalemia: Secondary | ICD-10-CM | POA: Diagnosis not present

## 2018-04-15 DIAGNOSIS — R509 Fever, unspecified: Secondary | ICD-10-CM | POA: Diagnosis not present

## 2018-04-15 DIAGNOSIS — Z87440 Personal history of urinary (tract) infections: Secondary | ICD-10-CM

## 2018-04-15 DIAGNOSIS — R402362 Coma scale, best motor response, obeys commands, at arrival to emergency department: Secondary | ICD-10-CM | POA: Diagnosis present

## 2018-04-15 DIAGNOSIS — R1310 Dysphagia, unspecified: Secondary | ICD-10-CM | POA: Diagnosis not present

## 2018-04-15 DIAGNOSIS — Z8619 Personal history of other infectious and parasitic diseases: Secondary | ICD-10-CM

## 2018-04-15 DIAGNOSIS — Z79899 Other long term (current) drug therapy: Secondary | ICD-10-CM

## 2018-04-15 DIAGNOSIS — I4891 Unspecified atrial fibrillation: Secondary | ICD-10-CM | POA: Diagnosis present

## 2018-04-15 DIAGNOSIS — M810 Age-related osteoporosis without current pathological fracture: Secondary | ICD-10-CM | POA: Diagnosis present

## 2018-04-15 DIAGNOSIS — Z818 Family history of other mental and behavioral disorders: Secondary | ICD-10-CM

## 2018-04-15 DIAGNOSIS — L8990 Pressure ulcer of unspecified site, unspecified stage: Secondary | ICD-10-CM

## 2018-04-15 DIAGNOSIS — G9341 Metabolic encephalopathy: Secondary | ICD-10-CM | POA: Diagnosis present

## 2018-04-15 DIAGNOSIS — J439 Emphysema, unspecified: Secondary | ICD-10-CM | POA: Diagnosis present

## 2018-04-15 DIAGNOSIS — F0150 Vascular dementia without behavioral disturbance: Secondary | ICD-10-CM | POA: Diagnosis not present

## 2018-04-15 DIAGNOSIS — Z888 Allergy status to other drugs, medicaments and biological substances status: Secondary | ICD-10-CM

## 2018-04-15 DIAGNOSIS — Z8249 Family history of ischemic heart disease and other diseases of the circulatory system: Secondary | ICD-10-CM

## 2018-04-15 DIAGNOSIS — D649 Anemia, unspecified: Secondary | ICD-10-CM | POA: Diagnosis not present

## 2018-04-15 DIAGNOSIS — Z66 Do not resuscitate: Secondary | ICD-10-CM | POA: Diagnosis not present

## 2018-04-15 DIAGNOSIS — R5381 Other malaise: Secondary | ICD-10-CM | POA: Diagnosis not present

## 2018-04-15 DIAGNOSIS — I69319 Unspecified symptoms and signs involving cognitive functions following cerebral infarction: Secondary | ICD-10-CM | POA: Diagnosis not present

## 2018-04-15 DIAGNOSIS — Z91040 Latex allergy status: Secondary | ICD-10-CM

## 2018-04-15 DIAGNOSIS — I472 Ventricular tachycardia: Secondary | ICD-10-CM | POA: Diagnosis present

## 2018-04-15 DIAGNOSIS — E039 Hypothyroidism, unspecified: Secondary | ICD-10-CM | POA: Diagnosis present

## 2018-04-15 DIAGNOSIS — Z1612 Extended spectrum beta lactamase (ESBL) resistance: Secondary | ICD-10-CM | POA: Diagnosis present

## 2018-04-15 DIAGNOSIS — I10 Essential (primary) hypertension: Secondary | ICD-10-CM | POA: Diagnosis present

## 2018-04-15 DIAGNOSIS — L8942 Pressure ulcer of contiguous site of back, buttock and hip, stage 2: Secondary | ICD-10-CM

## 2018-04-15 LAB — COMPREHENSIVE METABOLIC PANEL
ALBUMIN: 2.2 g/dL — AB (ref 3.5–5.0)
ALBUMIN: 2 g/dL — AB (ref 3.5–5.0)
ALK PHOS: 66 U/L (ref 38–126)
ALT: 28 U/L (ref 0–44)
ALT: 24 U/L (ref 0–44)
ANION GAP: 11 (ref 5–15)
AST: 26 U/L (ref 15–41)
AST: 22 U/L (ref 15–41)
Alkaline Phosphatase: 72 U/L (ref 38–126)
Anion gap: 8 (ref 5–15)
BILIRUBIN TOTAL: 0.7 mg/dL (ref 0.3–1.2)
BUN: 11 mg/dL (ref 8–23)
BUN: 12 mg/dL (ref 8–23)
CALCIUM: 7.6 mg/dL — AB (ref 8.9–10.3)
CO2: 23 mmol/L (ref 22–32)
CO2: 21 mmol/L — ABNORMAL LOW (ref 22–32)
CREATININE: 0.92 mg/dL (ref 0.44–1.00)
Calcium: 8.2 mg/dL — ABNORMAL LOW (ref 8.9–10.3)
Chloride: 108 mmol/L (ref 98–111)
Chloride: 110 mmol/L (ref 98–111)
Creatinine, Ser: 0.94 mg/dL (ref 0.44–1.00)
GFR calc Af Amer: 59 mL/min — ABNORMAL LOW (ref >60)
GFR calc Af Amer: >60 mL/min (ref >60)
GFR calc non Af Amer: 51 mL/min — ABNORMAL LOW (ref >60)
GFR calc non Af Amer: 52 mL/min — ABNORMAL LOW (ref >60)
GLUCOSE: 115 mg/dL — AB (ref 70–99)
GLUCOSE: 111 mg/dL — AB (ref 70–99)
POTASSIUM: 2.6 mmol/L — AB (ref 3.5–5.1)
Potassium: 3.1 mmol/L — ABNORMAL LOW (ref 3.5–5.1)
SODIUM: 142 mmol/L (ref 135–145)
Sodium: 139 mmol/L (ref 135–145)
TOTAL PROTEIN: 5 g/dL — AB (ref 6.5–8.1)
Total Bilirubin: 0.8 mg/dL (ref 0.3–1.2)
Total Protein: 5.3 g/dL — ABNORMAL LOW (ref 6.5–8.1)

## 2018-04-15 LAB — CBC WITH DIFFERENTIAL/PLATELET
ABS IMMATURE GRANULOCYTES: 0.1 10*3/uL (ref 0.0–0.1)
Abs Immature Granulocytes: 0.1 10*3/uL (ref 0.0–0.1)
Basophils Absolute: 0 10*3/uL (ref 0.0–0.1)
Basophils Absolute: 0.1 10*3/uL (ref 0.0–0.1)
Basophils Relative: 0 %
Basophils Relative: 0 %
Eosinophils Absolute: 0 10*3/uL (ref 0.0–0.7)
Eosinophils Absolute: 0 10*3/uL (ref 0.0–0.7)
Eosinophils Relative: 0 %
Eosinophils Relative: 0 %
HEMATOCRIT: 30.9 % — AB (ref 36.0–46.0)
HEMATOCRIT: 27.7 % — AB (ref 36.0–46.0)
HEMOGLOBIN: 9.7 g/dL — AB (ref 12.0–15.0)
HEMOGLOBIN: 8.8 g/dL — AB (ref 12.0–15.0)
Immature Granulocytes: 1 %
Immature Granulocytes: 1 %
LYMPHS ABS: 1.4 10*3/uL (ref 0.7–4.0)
LYMPHS PCT: 14 %
LYMPHS PCT: 10 %
Lymphs Abs: 1.7 10*3/uL (ref 0.7–4.0)
MCH: 30 pg (ref 26.0–34.0)
MCH: 30.3 pg (ref 26.0–34.0)
MCHC: 31.4 g/dL (ref 30.0–36.0)
MCHC: 31.8 g/dL (ref 30.0–36.0)
MCV: 95.7 fL (ref 78.0–100.0)
MCV: 95.5 fL (ref 78.0–100.0)
MONO ABS: 0.9 10*3/uL (ref 0.1–1.0)
MONO ABS: 1.1 10*3/uL — AB (ref 0.1–1.0)
Monocytes Relative: 7 %
Monocytes Relative: 8 %
NEUTROS ABS: 9.5 10*3/uL — AB (ref 1.7–7.7)
Neutro Abs: 11 10*3/uL — ABNORMAL HIGH (ref 1.7–7.7)
Neutrophils Relative %: 78 %
Neutrophils Relative %: 81 %
Platelets: 225 10*3/uL (ref 150–400)
Platelets: 209 10*3/uL (ref 150–400)
RBC: 3.23 MIL/uL — ABNORMAL LOW (ref 3.87–5.11)
RBC: 2.9 MIL/uL — AB (ref 3.87–5.11)
RDW: 15.9 % — ABNORMAL HIGH (ref 11.5–15.5)
RDW: 16 % — ABNORMAL HIGH (ref 11.5–15.5)
WBC: 12.2 10*3/uL — ABNORMAL HIGH (ref 4.0–10.5)
WBC: 13.6 10*3/uL — ABNORMAL HIGH (ref 4.0–10.5)

## 2018-04-15 LAB — URINALYSIS, ROUTINE W REFLEX MICROSCOPIC
Bilirubin Urine: NEGATIVE
GLUCOSE, UA: NEGATIVE mg/dL
Ketones, ur: NEGATIVE mg/dL
Nitrite: POSITIVE — AB
PROTEIN: 30 mg/dL — AB
Specific Gravity, Urine: 1.016 (ref 1.005–1.030)
WBC, UA: >50 WBC/hpf — ABNORMAL HIGH (ref 0–5)
pH: 6 (ref 5.0–8.0)

## 2018-04-15 LAB — TSH: TSH: 8.63 u[IU]/mL — ABNORMAL HIGH (ref 0.350–4.500)

## 2018-04-15 LAB — TROPONIN I
TROPONIN I: 0.06 ng/mL — AB (ref <0.03)
Troponin I: 0.28 ng/mL (ref <0.03)
Troponin I: 0.09 ng/mL (ref <0.03)

## 2018-04-15 LAB — GLUCOSE, CAPILLARY
GLUCOSE-CAPILLARY: 108 mg/dL — AB (ref 70–99)
GLUCOSE-CAPILLARY: 79 mg/dL (ref 70–99)
GLUCOSE-CAPILLARY: 75 mg/dL (ref 70–99)

## 2018-04-15 LAB — MAGNESIUM
MAGNESIUM: 2.2 mg/dL (ref 1.7–2.4)
Magnesium: 2 mg/dL (ref 1.7–2.4)

## 2018-04-15 LAB — LACTIC ACID, PLASMA
Lactic Acid, Venous: 1.5 mmol/L (ref 0.5–1.9)
Lactic Acid, Venous: 1.2 mmol/L (ref 0.5–1.9)

## 2018-04-15 LAB — PROTIME-INR
INR: 1.28
Prothrombin Time: 15.9 seconds — ABNORMAL HIGH (ref 11.4–15.2)

## 2018-04-15 LAB — I-STAT CG4 LACTIC ACID, ED
LACTIC ACID, VENOUS: 0.99 mmol/L (ref 0.5–1.9)
Lactic Acid, Venous: 1.38 mmol/L (ref 0.5–1.9)

## 2018-04-15 LAB — PROCALCITONIN: PROCALCITONIN: 1.45 ng/mL

## 2018-04-15 LAB — APTT: aPTT: 38 seconds — ABNORMAL HIGH (ref 24–36)

## 2018-04-15 LAB — MRSA PCR SCREENING: MRSA BY PCR: NEGATIVE

## 2018-04-15 MED ORDER — ENOXAPARIN SODIUM 30 MG/0.3ML ~~LOC~~ SOLN
30.0000 mg | Freq: Every day | SUBCUTANEOUS | Status: DC
  Administered 2018-04-15 – 2018-04-16 (×2): 30 mg via SUBCUTANEOUS
  Filled 2018-04-15 (×2): qty 0.3

## 2018-04-15 MED ORDER — ATROPINE SULFATE 1 MG/ML IJ SOLN: 0.4000 mg | Freq: Once | INTRAMUSCULAR | Status: DC

## 2018-04-15 MED ORDER — ACETAMINOPHEN 650 MG RE SUPP: 650.0000 mg | Freq: Four times a day (QID) | RECTAL | Status: DC | PRN

## 2018-04-15 MED ORDER — ACETAMINOPHEN 650 MG RE SUPP
650.0000 mg | Freq: Once | RECTAL | Status: AC
  Administered 2018-04-15: 650 mg via RECTAL
  Filled 2018-04-15: qty 1

## 2018-04-15 MED ORDER — ONDANSETRON HCL 4 MG PO TABS
4.0000 mg | ORAL_TABLET | Freq: Four times a day (QID) | ORAL | Status: DC | PRN
Start: 2018-04-15 — End: 2018-04-19

## 2018-04-15 MED ORDER — ACETAMINOPHEN 325 MG PO TABS: 650.0000 mg | ORAL_TABLET | Freq: Four times a day (QID) | ORAL | Status: DC | PRN

## 2018-04-15 MED ORDER — MAGNESIUM SULFATE 2 GM/50ML IV SOLN
2.0000 g | Freq: Once | INTRAVENOUS | Status: AC
  Administered 2018-04-15: 2 g via INTRAVENOUS
  Filled 2018-04-15: qty 50

## 2018-04-15 MED ORDER — SODIUM CHLORIDE 0.9 % IV SOLN
1.0000 g | INTRAVENOUS | Status: DC
  Administered 2018-04-15 – 2018-04-18 (×4): 1 g via INTRAVENOUS
  Filled 2018-04-15 (×4): qty 1

## 2018-04-15 MED ORDER — ONDANSETRON HCL 4 MG/2ML IJ SOLN: 4.0000 mg | Freq: Four times a day (QID) | INTRAMUSCULAR | Status: DC | PRN

## 2018-04-15 MED ORDER — SODIUM CHLORIDE 0.9 % IV SOLN
1.0000 g | INTRAVENOUS | Status: DC
  Administered 2018-04-15: 1 g via INTRAVENOUS
  Filled 2018-04-15: qty 10

## 2018-04-15 MED ORDER — POTASSIUM CHLORIDE 10 MEQ/100ML IV SOLN
10.0000 meq | INTRAVENOUS | Status: AC
  Administered 2018-04-15 (×4): 10 meq via INTRAVENOUS
  Filled 2018-04-15 (×4): qty 100

## 2018-04-15 MED ORDER — SODIUM CHLORIDE 0.9 % IV SOLN
1000.0000 mL | INTRAVENOUS | Status: DC
  Administered 2018-04-15 – 2018-04-16 (×5): 1000 mL via INTRAVENOUS

## 2018-04-15 MED ORDER — LEVOTHYROXINE SODIUM 100 MCG IV SOLR
44.0000 ug | Freq: Every day | INTRAVENOUS | Status: DC
  Administered 2018-04-15 – 2018-04-19 (×5): 44 ug via INTRAVENOUS
  Filled 2018-04-15 (×6): qty 5

## 2018-04-15 NOTE — ED Notes (Signed)
NS IV infusing , blood cultures collected and sent to lab , IV sites unremarkable . Family at bedside , plan of care explained to family .

## 2018-04-15 NOTE — H&P (Signed)
History and Physical    Jacqueline Robles WPY:[id number] DOB: 17-Jan-1922 DOA: 04/15/2018  PCP: Hendricks Limes, MD  Patient coming from: Skilled nursing facility.  History obtained from patient's daughter ER physician from previous records.  Chief Complaint: Fever.  HPI: Jacqueline Robles is a 84 y.o. female with history of vascular dementia, hypothyroidism chronic anemia was brought to the ER after patient was found to have fever.  Patient also was noticed to have confusion.  Exact history is not clear.  ED Course: In the ER patient was confused and agitated initially.  Had brief runs of nonsustained V. tach labs show free hypokalemia will also signs of sepsis with UTI.  Patient was given fluids and started on empiric antibiotics.  While in the ER patient had bradycardic episodes.  Patient has a DNR status which was changed to limited code by patient's daughter with okay for ACLS medications and defibrillation.  Potassium was replaced.  On my exam patient appears still confused.  Review of Systems: As per HPI, rest all negative.   Past Medical History:  Diagnosis Date  . C. difficile colitis   . Depression   . Hyperlipidemia   . Hypertension   . Osteoporosis   . Thyroid disease   . UTI (lower urinary tract infection)     Past Surgical History:  Procedure Laterality Date  . FOOT SURGERY    . neck surgery       reports that she has never smoked. She has never used smokeless tobacco. She reports that she does not drink alcohol or use drugs.  Allergies  Allergen Reactions  . Fosamax [Alendronate Sodium] Other (See Comments)    Unknown, family is stating there is no allergic/adverse reaction to drug it was just discontinued.  [name] Hctz [Hydrochlorothiazide] Other (See Comments)    unknownUnknown, family is stating there is no allergic/adverse reaction to drug it was just discontinued.  . Latex     Unknown, family is stating there is no allergic/adverse reaction to drug it was just  discontinued.  . Neosporin [Neomycin-Bacitracin Zn-Polymyx] Other (See Comments)    On MAR  . Norvasc [Amlodipine Besylate]     Unknown, family is stating there is no allergic/adverse reaction to drug it was just discontinued.  . Bacitracin-Neomycin-Polymyxin Rash  . Lanolin Rash  . Naproxen Rash  . Polysporin [Bacitracin-Polymyxin B] Rash    rash    Family History  Problem Relation Age of Onset  . Hypertension Son     Prior to Admission medications   Medication Sig Start Date End Date Taking? Authorizing Provider  acetaminophen (TYLENOL) 500 MG tablet Take 1,000 mg by mouth 2 (two) times daily as needed for moderate pain.    Yes [provider]  bisacodyl (DULCOLAX) 10 MG suppository Place 10 mg rectally daily as needed for moderate constipation.   Yes [provider]  Calcium Carbonate-Vitamin D3 (CALCIUM 600-D) 600-400 MG-UNIT TABS Take 1 each by mouth daily.   Yes [provider]  cholecalciferol (VITAMIN D) 1000 units tablet Take 1,000 Units daily by mouth.    Yes [provider]  clopidogrel (PLAVIX) 75 MG tablet Take 75 mg by mouth daily.   Yes [provider]  ferrous sulfate 325 (65 FE) MG tablet Take 325 mg by mouth daily with breakfast.   Yes [provider]  fexofenadine (ALLEGRA) 180 MG tablet Take 180 mg daily by mouth.    Yes [provider]  fluticasone (FLONASE) 50 MCG/ACT nasal  spray Place 2 sprays daily into both nostrils.    Yes [provider]  levothyroxine (SYNTHROID, LEVOTHROID) 88 MCG tablet Take 88 mcg by mouth daily before breakfast.   Yes [provider]  magnesium hydroxide (MILK OF MAGNESIA) 400 MG/5ML suspension Take 30 mLs by mouth daily as needed for mild constipation.   Yes [provider]  Melatonin 10 MG SUBL Place 1 tablet under the tongue at bedtime as needed (sleep).   Yes [provider]  Multiple Vitamins-Minerals (ELDERTONIC PO) Take 15 mLs by  mouth 2 (two) times daily.    Yes [provider]  Multiple Vitamins-Minerals (MULTIVITAMIN WITH MINERALS) tablet Take 1 tablet by mouth daily.   Yes [provider]  Multiple Vitamins-Minerals (PRESERVISION AREDS 2 PO) Take 1 capsule by mouth 2 (two) times daily.    Yes [provider]  nitrofurantoin, macrocrystal-monohydrate, (MACROBID) 100 MG capsule Take 100 mg by mouth 2 (two) times daily.   Yes [provider]  NUTRITIONAL SUPPLEMENT LIQD Take 120 mLs by mouth 3 (three) times daily. MedPass    Yes [provider]  Propylene Glycol (SYSTANE BALANCE) 0.6 % SOLN Apply 1 drop to eye 3 (three) times daily as needed (Irritation).   Yes [provider]  senna-docusate (SENOKOT-S) 8.6-50 MG tablet Take 2 tablets by mouth at bedtime.   Yes [provider]  sertraline (ZOLOFT) 50 MG tablet Take 50 mg by mouth daily.    Yes [provider]  Sodium Phosphates (RA SALINE ENEMA RE) Place 1 Applicatorful rectally daily as needed (constipation).   Yes [provider]    Physical Exam: Vitals:   04/15/18 0515 04/15/18 0530 04/15/18 0545 04/15/18 0600  BP: (!) 126/51 (!) 155/64 (!) 132/56 138/70  Pulse: (!) 32 (!) 41 (!) 38 (!) 34  Resp: (!) 24 (!) 24 (!) 22 (!) 23  Temp:      TempSrc:      SpO2: 97% 97% 97% 97%  Weight:      Height:          Constitutional: Moderately built and nourished. Vitals:   04/15/18 0515 04/15/18 0530 04/15/18 0545 04/15/18 0600  BP: (!) 126/51 (!) 155/64 (!) 132/56 138/70  Pulse: (!) 32 (!) 41 (!) 38 (!) 34  Resp: (!) 24 (!) 24 (!) 22 (!) 23  Temp:      TempSrc:      SpO2: 97% 97% 97% 97%  Weight:      Height:       Eyes: Anicteric no pallor. ENMT: No discharge from the ears eyes nose or mouth. Neck: No mass felt.  No neck rigidity.  No JVD appreciated. Respiratory: No rhonchi or crepitations. Cardiovascular: S1-S2 heard no murmurs appreciated. Abdomen: Soft nontender bowel  sounds present. Musculoskeletal: No edema. Skin: No rash. Neurologic: Alert awake but confused.  Does not follow commands.  Moves all extremities. Psychiatric: Appears confused.   Labs on Admission: I have personally reviewed following labs and imaging studies  CBC: Recent Labs  Lab 04/12/18 04/15/18 0123  WBC 13.7 12.2*  NEUTROABS 12 9.5*  HGB 8.4* 9.7*  HCT 26* 30.9*  MCV  --  95.7  PLT 194 347   Basic Metabolic Panel: Recent Labs  Lab 04/15/18 0123 04/15/18 0231  NA 142  --   K 2.6*  --   CL 108  --   CO2 23  --   GLUCOSE 115*  --   BUN 11  --   CREATININE  0.94  --   CALCIUM 8.2*  --   MG  --  2.0   GFR: Estimated Creatinine Clearance: 29.5 mL/min (by C-G formula based on SCr of 0.94 mg/dL). Liver Function Tests: Recent Labs  Lab 04/15/18 0123  AST 26  ALT 28  ALKPHOS 72  BILITOT 0.8  PROT 5.3*  ALBUMIN 2.2*   No results for input(s): LIPASE, AMYLASE in the last 168 hours. No results for input(s): AMMONIA in the last 168 hours. Coagulation Profile: No results for input(s): INR, PROTIME in the last 168 hours. Cardiac Enzymes: No results for input(s): CKTOTAL, CKMB, CKMBINDEX, TROPONINI in the last 168 hours. BNP (last 3 results) No results for input(s): PROBNP in the last 8760 hours. HbA1C: No results for input(s): HGBA1C in the last 72 hours. CBG: No results for input(s): GLUCAP in the last 168 hours. Lipid Profile: No results for input(s): CHOL, HDL, LDLCALC, TRIG, CHOLHDL, LDLDIRECT in the last 72 hours. Thyroid Function Tests: No results for input(s): TSH, T4TOTAL, FREET4, T3FREE, THYROIDAB in the last 72 hours. Anemia Panel: No results for input(s): VITAMINB12, FOLATE, FERRITIN, TIBC, IRON, RETICCTPCT in the last 72 hours. Urine analysis:    Component Value Date/Time   COLORURINE AMBER (A) 04/15/2018 0055   APPEARANCEUR TURBID (A) 04/15/2018 0055   LABSPEC 1.016 04/15/2018 0055   PHURINE 6.0 04/15/2018 0055   GLUCOSEU NEGATIVE 04/15/2018  0055   HGBUR SMALL (A) 04/15/2018 0055   BILIRUBINUR NEGATIVE 04/15/2018 0055   KETONESUR NEGATIVE 04/15/2018 0055   PROTEINUR 30 (A) 04/15/2018 0055   UROBILINOGEN 0.2 10/31/2014 1945   NITRITE POSITIVE (A) 04/15/2018 0055   LEUKOCYTESUR LARGE (A) 04/15/2018 0055   Sepsis Labs: @LABRCNTIP (procalcitonin:4,lacticidven:4) )No results found for this or any previous visit (from the past 240 hour(s)).   Radiological Exams on Admission: Dg Chest Portable 1 View  Result Date: 04/15/2018 CLINICAL DATA:  Fever this evening. EXAM: PORTABLE CHEST 1 VIEW COMPARISON:  12/08/2016 FINDINGS: Examination is technically limited due to patient rotation. As visualized, the heart size and pulmonary vascularity appear normal. Probable emphysematous changes and scattered fibrosis in the lungs. No focal airspace disease or consolidation is identified. No blunting of costophrenic angles. No pneumothorax. Calcification of the aorta. Postoperative changes in the cervical spine. IMPRESSION: Probable emphysematous changes in the lungs. No evidence of active pulmonary disease. Electronically Signed   By: Lucienne Capers M.D.   On: 04/15/2018 01:25    EKG: Independently reviewed.  Sinus rhythm with PVCs.  Monitor shows runs of nonsustained V. tach at times bradycardic.  Assessment/Plan Principal Problem:   Sepsis ([place]) Active Problems:   Aortic stenosis   Hypothyroidism   Lower urinary tract infectious disease   Vascular dementia   Chronic anemia    1. Sepsis of UTI patient is placed on cefepime.  Follow cultures continue to follow lactate procalcitonin levels. 2. Runs of nonsustained V. tach and bradycardic episodes.  Patient is on zoll pad.  I have requested cardiology consult.  At this time neurology feels patient's arrhythmia may be from hypokalemia which is getting replaced.  Recheck metabolic panel.  Will follow cardiology recommendations. 3. Hypothyroidism on Synthroid which will be dosed IV  flow. 4. Chronic anemia follow CBC. 5. History of TIA on Plavix.  Patient is presently n.p.o. 6. Since patient is encephalopathic patient is kept n.p.o. 7. Vascular dementia presently confused.   DVT prophylaxis: Lovenox. Code Status: Limited code. Family Communication: Patient's daughter. Disposition Plan: To be determined. Consults called: Cardiology. Admission status: Inpatient.  Rise Patience MD [facility] Pager [phone].  If 7PM-7AM, please contact night-coverage [email or web address] Password [facility]  04/15/2018, 6:17 AM

## 2018-04-15 NOTE — Consult Note (Signed)
Cardiology Consultation:   Patient ID: Jacqueline Robles; [id number]; 05-02-1922   Admit date: 04/15/2018 Date of Consult: 04/15/2018  Primary Care Provider: Hendricks Limes, MD Primary Cardiologist: None   Patient Profile:   Jacqueline Robles is a 84 y.o. female with a hx of of advanced dementia, severe AS, HTN, HLD, thyroid disease who is being seen today for the evaluation of bradycardia, VT at the request of Dr. Hal Hope.  History of Present Illness:   Jacqueline Robles is a 84 y.o. female with a hx of of advanced dementia, severe AS, HTN, HLD, thyroid disease who is being seen today for the evaluation of bradycardia, VT.   The patient presented to the ED from her nursing home with fever. Per ED note, she was unable to answer any questions due to her advanced dementia, although this reportedly is her baseline.   In the ED, the patient was febrile to 102.4 bradycardic to 30s with reported junctional rhythm. She was also noted to have episodes of polymorphic VT. Labs included K 2.6, Mg 2.0. WBC 12.2. UA consistent with UTI. ECGs showed sinus bradycardia to 30s vs junctional rhythm with markedly prolonged QT and with some ectopy. She was admitted to the hospitalist service and cardiology was consulted.   On my evaluation, the patient is being moved to the floor from the ED. ECG shows irregular ventricular rhythm without definite P wave morphology suggestive of AF and RBBB with LAFB. She is laying in bed and moaning and unable to answer any questions but does say "[place]."   Past Medical History:  Diagnosis Date  . C. difficile colitis   . Depression   . Hyperlipidemia   . Hypertension   . Osteoporosis   . Thyroid disease   . UTI (lower urinary tract infection)     Past Surgical History:  Procedure Laterality Date  . FOOT SURGERY    . neck surgery       Home Medications:  Prior to Admission medications   Medication Sig Start Date End Date Taking? Authorizing Provider  acetaminophen  (TYLENOL) 500 MG tablet Take 1,000 mg by mouth 2 (two) times daily as needed for moderate pain.    Yes [provider]  bisacodyl (DULCOLAX) 10 MG suppository Place 10 mg rectally daily as needed for moderate constipation.   Yes [provider]  Calcium Carbonate-Vitamin D3 (CALCIUM 600-D) 600-400 MG-UNIT TABS Take 1 each by mouth daily.   Yes [provider]  cholecalciferol (VITAMIN D) 1000 units tablet Take 1,000 Units daily by mouth.    Yes [provider]  clopidogrel (PLAVIX) 75 MG tablet Take 75 mg by mouth daily.   Yes [provider]  ferrous sulfate 325 (65 FE) MG tablet Take 325 mg by mouth daily with breakfast.   Yes [provider]  fexofenadine (ALLEGRA) 180 MG tablet Take 180 mg daily by mouth.    Yes [provider]  fluticasone (FLONASE) 50 MCG/ACT nasal spray Place 2 sprays daily into both nostrils.    Yes [provider]  levothyroxine (SYNTHROID, LEVOTHROID) 88 MCG tablet Take 88 mcg by mouth daily before breakfast.   Yes [provider]  magnesium hydroxide (MILK OF MAGNESIA) 400 MG/5ML suspension Take 30 mLs by mouth daily as needed for mild constipation.   Yes [provider]  Melatonin 10 MG SUBL Place 1 tablet under the tongue at bedtime as needed (sleep).   Yes [provider]  Multiple Vitamins-Minerals (ELDERTONIC PO)  Take 15 mLs by mouth 2 (two) times daily.    Yes [provider]  Multiple Vitamins-Minerals (MULTIVITAMIN WITH MINERALS) tablet Take 1 tablet by mouth daily.   Yes [provider]  Multiple Vitamins-Minerals (PRESERVISION AREDS 2 PO) Take 1 capsule by mouth 2 (two) times daily.    Yes [provider]  nitrofurantoin, macrocrystal-monohydrate, (MACROBID) 100 MG capsule Take 100 mg by mouth 2 (two) times daily.   Yes [provider]  NUTRITIONAL SUPPLEMENT LIQD Take 120 mLs by mouth 3 (three) times daily. MedPass    Yes  [provider]  Propylene Glycol (SYSTANE BALANCE) 0.6 % SOLN Apply 1 drop to eye 3 (three) times daily as needed (Irritation).   Yes [provider]  senna-docusate (SENOKOT-S) 8.6-50 MG tablet Take 2 tablets by mouth at bedtime.   Yes [provider]  sertraline (ZOLOFT) 50 MG tablet Take 50 mg by mouth daily.    Yes [provider]  Sodium Phosphates (RA SALINE ENEMA RE) Place 1 Applicatorful rectally daily as needed (constipation).   Yes [provider]    Inpatient Medications: Scheduled Meds: . atropine  0.4 mg Intravenous Once  . enoxaparin (LOVENOX) injection  40 mg Subcutaneous Q24H   Continuous Infusions: . sodium chloride 1,000 mL (04/15/18 0127)  . ceFEPime (MAXIPIME) IV    . potassium chloride 10 mEq (04/15/18 0526)   PRN Meds: acetaminophen **OR** acetaminophen, ondansetron **OR** ondansetron (ZOFRAN) IV  Allergies:    Allergies  Allergen Reactions  . Fosamax [Alendronate Sodium] Other (See Comments)    Unknown, family is stating there is no allergic/adverse reaction to drug it was just discontinued.  [name] Hctz [Hydrochlorothiazide] Other (See Comments)    unknownUnknown, family is stating there is no allergic/adverse reaction to drug it was just discontinued.  . Latex     Unknown, family is stating there is no allergic/adverse reaction to drug it was just discontinued.  . Neosporin [Neomycin-Bacitracin Zn-Polymyx] Other (See Comments)    On MAR  . Norvasc [Amlodipine Besylate]     Unknown, family is stating there is no allergic/adverse reaction to drug it was just discontinued.  . Bacitracin-Neomycin-Polymyxin Rash  . Lanolin Rash  . Naproxen Rash  . Polysporin [Bacitracin-Polymyxin B] Rash    rash    Social History:   Social History   Socioeconomic History  . Marital status: Widowed    Spouse name: Not on file  . Number of children: Not on file  . Years of education: Not on file  . Highest education level: Not  on file  Occupational History  . Not on file  Social Needs  . Financial resource strain: Not hard at all  . Food insecurity:    Worry: Never true    Inability: Never true  . Transportation needs:    Medical: No    Non-medical: No  Tobacco Use  . Smoking status: Never Smoker  . Smokeless tobacco: Never Used  Substance and Sexual Activity  . Alcohol use: No  . Drug use: No  . Sexual activity: Not on file  Lifestyle  . Physical activity:    Days per week: 0 days    Minutes per session: 0 min  . Stress: Not at all  Relationships  . Social connections:    Talks on phone: Twice a week    Gets together: Twice a week    Attends religious service: Never    Active member of club or organization: No    Attends  meetings of clubs or organizations: Never    Relationship status: Widowed  . Intimate partner violence:    Fear of current or ex partner: No    Emotionally abused: No    Physically abused: No    Forced sexual activity: No  Other Topics Concern  . Not on file  Social History Narrative   Family is very involved in care.    Family History:    Family History  Problem Relation Age of Onset  . Hypertension Son      ROS:  Please see the history of present illness.  All other ROS reviewed and negative.     Physical Exam/Data:   Vitals:   04/15/18 0515 04/15/18 0530 04/15/18 0545 04/15/18 0600  BP: (!) 126/51 (!) 155/64 (!) 132/56 138/70  Pulse: (!) 32 (!) 41 (!) 38 (!) 34  Resp: (!) 24 (!) 24 (!) 22 (!) 23  Temp:      TempSrc:      SpO2: 97% 97% 97% 97%  Weight:      Height:        Intake/Output Summary (Last 24 hours) at 04/15/2018 0635 Last data filed at 04/15/2018 0403 Gross per 24 hour  Intake 150 ml  Output -  Net 150 ml   Filed Weights   04/15/18 0113  Weight: 49.9 kg   Body mass index is 20.12 kg/m.  General:  Very chronically ill appearing,  in no acute distress HEENT: normal Endocrine:  No thryomegaly Cardiac:  Slow and irregular with II/VI  systolic murmur Lungs:  Coarse breath sounds with increased WOB Abd: soft, nontender  Ext: no edema Musculoskeletal:  No deformities, BUE and BLE strength normal and equal Skin: warm and dry  Neuro:  Moves all extremities equally Psych:  Decreased participation due to advanced dementia  EKG:  The EKG was personally reviewed and demonstrates:  Most recently porbabale AF with slow ventricular response. Prolonged QT with probable U waves. Telemetry:  Telemetry was personally reviewed and demonstrates: Currently irregular bradycardia. Had several episodes of polymoprphic VT in the ED  Relevant CV Studies: Echocardiogram 2015: - Normal LV size with mild LV hypertrophy, EF 55-60%. Normal RV size and systolic function. The aortic valve was severely calcified and trileaflet with visually severe aortic stenosis and calculated AVA 0.84 cm2.[name] However, mean gradient was only 20 mmHg. Would suggest further evaluation by additional TTE images or possibly TEE to differentiate moderate versus severe AS. Mild AI. There was heavy posterior mitral annular calcification without significant mitral stenosis and trivial MR.  Laboratory Data:  Chemistry Recent Labs  Lab 04/15/18 0123  NA 142  K 2.6*  CL 108  CO2 23  GLUCOSE 115*  BUN 11  CREATININE 0.94  CALCIUM 8.2*  GFRNONAA 51*  GFRAA 59*  ANIONGAP 11    Recent Labs  Lab 04/15/18 0123  PROT 5.3*  ALBUMIN 2.2*  AST 26  ALT 28  ALKPHOS 72  BILITOT 0.8   Hematology Recent Labs  Lab 04/12/18 04/15/18 0123  WBC 13.7 12.2*  RBC  --  3.23*  HGB 8.4* 9.7*  HCT 26* 30.9*  MCV  --  95.7  MCH  --  30.0  MCHC  --  31.4  RDW  --  15.9*  PLT 194 225   Cardiac EnzymesNo results for input(s): TROPONINI in the last 168 hours. No results for input(s): TROPIPOC in the last 168 hours.  BNPNo results for input(s): BNP, PROBNP in the last 168 hours.  DDimer  No results for input(s): DDIMER in the last 168  hours.  Radiology/Studies:  Dg Chest Portable 1 View  Result Date: 04/15/2018 CLINICAL DATA:  Fever this evening. EXAM: PORTABLE CHEST 1 VIEW COMPARISON:  12/08/2016 FINDINGS: Examination is technically limited due to patient rotation. As visualized, the heart size and pulmonary vascularity appear normal. Probable emphysematous changes and scattered fibrosis in the lungs. No focal airspace disease or consolidation is identified. No blunting of costophrenic angles. No pneumothorax. Calcification of the aorta. Postoperative changes in the cervical spine. IMPRESSION: Probable emphysematous changes in the lungs. No evidence of active pulmonary disease. Electronically Signed   By: Lucienne Capers M.D.   On: 04/15/2018 01:25    Assessment and Plan:   Bradycardia, conduction disease Polymorphic VT ?AF The patient was admitted for fever and UTI and was noted to have significant bradycardia with runs of polymorphic VT and intermittent junctional rhythm with what appears to be AF. Her K is 2.6, which is likely predisposing to her prolonged QT and polymorphic VT in the ED. Anticipate improvement with electrolyte repletion.   Her bradycardia has been variable since admission, and she is currently in the 59s. Her acute illness, electrolyte disturbances  are also likely contributing to her rhythm disturbances. 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 Due to her advanced dementia, accurate assessment of associated symptoms is not possible. By a similar measure, due to her age and dementia, it is not clear that she would be a good candidate for pacing.   Finally, her most recent ECG shows what appears to be AF (vs irregular junctional escape). She has CHADS2VASC that would likely suggest need for anticoagulation, but the decision to do sho should be done with consideration of her age and comorbidities.  At this time, would recommend aggressive electrolyte repletion and  management of her acute infection. Once she stabilizes, her rhythm disturbances and conduction abnormalities can be better characterized.  Aortic stenosis Described on echo in 2016. I do not see any cardiology records in system, and it appears that this has been managed conservatively.  -Continue management as above -Continue BP control   For questions or updates, please contact [facility] Please consult [email or web address] for contact info under Cardiology/STEMI.   Signed, Nila Nephew, MD  04/15/2018 6:35 AM

## 2018-04-15 NOTE — ED Triage Notes (Signed)
Patient arrived with EMS from [facility] staff reported fever this evening . CBG= 126 by EMS , Temp= 102.4 ( rectal) at arrival .

## 2018-04-15 NOTE — ED Notes (Signed)
Nurse and EMT currently drawing labs.

## 2018-04-15 NOTE — Progress Notes (Signed)
Pharmacy Antibiotic Note  Jacqueline Robles is a 84 y.o. female admitted on 04/15/2018 with UTI.  Pharmacy has been consulted for Cefepime dosing. WBC mildly elevated. Renal function age appropriate.   Plan: Cefepime 1g IV q24h Trend WBC, temp, renal function  F/U urine cultures  Height: 5\' 2"  (157.5 cm) Weight: 110 lb (49.9 kg) IBW/kg (Calculated) : 50.1  Temp (24hrs), Avg:100.4 F (38 C), Min:98.4 F (36.9 C), Max:102.4 F (39.1 C)  Recent Labs  Lab 04/12/18 04/15/18 0123 04/15/18 0130 04/15/18 0319  WBC 13.7 12.2*  --   --   CREATININE  --  0.94  --   --   LATICACIDVEN  --   --  1.38 0.99    Estimated Creatinine Clearance: 29.5 mL/min (by C-G formula based on SCr of 0.94 mg/dL).    Allergies  Allergen Reactions  . Fosamax [Alendronate Sodium] Other (See Comments)    Unknown, family is stating there is no allergic/adverse reaction to drug it was just discontinued.  [name] Hctz [Hydrochlorothiazide] Other (See Comments)    unknownUnknown, family is stating there is no allergic/adverse reaction to drug it was just discontinued.  . Latex     Unknown, family is stating there is no allergic/adverse reaction to drug it was just discontinued.  . Neosporin [Neomycin-Bacitracin Zn-Polymyx] Other (See Comments)    On MAR  . Norvasc [Amlodipine Besylate]     Unknown, family is stating there is no allergic/adverse reaction to drug it was just discontinued.  . Bacitracin-Neomycin-Polymyxin Rash  . Lanolin Rash  . Naproxen Rash  . Polysporin [Bacitracin-Polymyxin B] Rash    rash     Narda Bonds 04/15/2018 6:32 AM

## 2018-04-15 NOTE — Plan of Care (Addendum)
Seen and examined by me.  Reviewed history and physical, consultation reports, laboratory, radiology reports.  This is a 84 year old female with history of advanced dementia, hypothyroidism, chronic anemia was brought to the emergency department with fever secondary to urinary tract infection.  Patient was also found to be bradycardic with heart rate ranging in 30s to 60s.  Patient was also found to have nonsustained ventricular tachycardia.  Patient was found to have  hypokalemia, and anemia.  Was evaluated by cardiology considering patient's advanced age, advanced dementia, currently is able to maintain patient's blood pressure well, continue to treat underlying infection, electrolyte imbalances.  Patient continues to be confused.  Continue with Rocephin, follow-up with urine cultures and blood cultures.  Patient is also found to have stage II-III sacral decubitus ulcer present on admission.  Bradycardia Atrial fibrillation Nonsustained ventricular tachycardia Metabolic encephalopathy Severe sepsis secondary to urinary tract infection Urinary tract infection Hypokalemia Hypothyroidism Sacral decubitus ulcer stage II-III Chronic anemia Advanced age Severe debility History of CVA

## 2018-04-15 NOTE — ED Notes (Addendum)
RN unavailable to give report.

## 2018-04-15 NOTE — ED Provider Notes (Signed)
[facility] EMERGENCY DEPARTMENT Provider Note   CSN: [id number] Arrival date & time: 04/15/18  0037     History   Chief Complaint Chief Complaint  Patient presents with  . Fever    HPI Jacqueline Robles is a 84 y.o. female.  Patient presents to the emergency department for evaluation of fever.  Patient transported from nursing home.  Patient noted to have a fever tonight.  Patient has baseline dementia and confusion, cannot answer any questions.  She is reportedly at her baseline mental status, per nursing home staff. Level V Caveat due to dementia.     Past Medical History:  Diagnosis Date  . C. difficile colitis   . Depression   . Hyperlipidemia   . Hypertension   . Osteoporosis   . Thyroid disease   . UTI (lower urinary tract infection)     Patient Active Problem List   Diagnosis Date Noted  . Abnormal chest x-ray 04/13/2018  . Chronic anemia 03/30/2018  . Depression, major, single episode, complete remission ([place]) 12/25/2017  . History of itching of eye 10/13/2017  . History of SIADH 10/13/2017  . Vascular dementia 05/27/2017  . Adult failure to thrive 05/26/2017  . Recurrent falls 05/26/2017  . Encounter for family conference with patient present 12/04/2014  . Candidal diaper dermatitis 12/04/2014  . Agitation 11/10/2014  . C. difficile colitis 11/04/2014  . Aortic stenosis 11/01/2014  . Hypothyroidism 11/01/2014  . Hyperlipidemia 11/01/2014  . UTI (lower urinary tract infection) 11/01/2014  . Sacral insufficiency fracture 11/01/2014  . Sacral fracture, closed ([place]) 10/31/2014    Past Surgical History:  Procedure Laterality Date  . FOOT SURGERY    . neck surgery       OB History   None      Home Medications    Prior to Admission medications   Medication Sig Start Date End Date Taking? Authorizing Provider  acetaminophen (TYLENOL) 500 MG tablet Take 1,000 mg by mouth 2 (two) times daily as needed for moderate pain.      [provider]  Calcium Carbonate-Vitamin D3 (CALCIUM 600-D) 600-400 MG-UNIT TABS Take 1 each by mouth daily.    [provider]  cholecalciferol (VITAMIN D) 1000 units tablet Take 1,000 Units daily by mouth.     [provider]  clopidogrel (PLAVIX) 75 MG tablet Take 75 mg by mouth daily.    [provider]  fexofenadine (ALLEGRA) 180 MG tablet Take 180 mg daily by mouth.     [provider]  fluticasone (FLONASE) 50 MCG/ACT nasal spray Place 2 sprays daily into both nostrils.     [provider]  levothyroxine (SYNTHROID, LEVOTHROID) 88 MCG tablet Take 88 mcg by mouth daily before breakfast.    [provider]  Melatonin 10 MG CAPS Take 10 mg by mouth at bedtime.    [provider]  Multiple Vitamins-Minerals (ELDERTONIC PO) Take 15 mLs by mouth 2 (two) times daily.     [provider]  Multiple Vitamins-Minerals (MULTIVITAMIN WITH MINERALS) tablet Take 1 tablet by mouth daily.    [provider]  Multiple Vitamins-Minerals (PRESERVISION AREDS 2 PO) Take 1 capsule by mouth 2 (two) times daily.     [provider]  NUTRITIONAL SUPPLEMENT LIQD Take 120 mLs by mouth 3 (three) times daily. [employer]     [provider]  Propylene Glycol (SYSTANE BALANCE) 0.6 % SOLN Apply 1 drop to eye 3 (three) times daily as needed (Irritation).  [provider]  sertraline (ZOLOFT) 50 MG tablet Take 50 mg by mouth daily.     [provider]    Family History Family History  Problem Relation Age of Onset  . Hypertension Son     Social History Social History   Tobacco Use  . Smoking status: Never Smoker  . Smokeless tobacco: Never Used  Substance Use Topics  . Alcohol use: No  . Drug use: No     Allergies   Fosamax [alendronate sodium]; Hctz [hydrochlorothiazide]; Latex; Neosporin [neomycin-bacitracin zn-polymyx]; Norvasc [amlodipine besylate]; Bacitracin-neomycin-polymyxin;  Lanolin; Naproxen; and Polysporin [bacitracin-polymyxin b]   Review of Systems Review of Systems   Physical Exam Updated Vital Signs BP (!) 184/61 (BP Location: Right Arm)   Pulse (!) 58   Temp (!) 102.4 F (39.1 C) (Rectal)   Resp (!) 22   SpO2 98%   Physical Exam  Constitutional: She appears well-developed and well-nourished. No distress.  HENT:  Head: Normocephalic and atraumatic.  Right Ear: Hearing normal.  Left Ear: Hearing normal.  Nose: Nose normal.  Mouth/Throat: Oropharynx is clear and moist and mucous membranes are normal.  Eyes: Pupils are equal, round, and reactive to light. Conjunctivae and EOM are normal.  Neck: Normal range of motion. Neck supple.  Cardiovascular: Regular rhythm, S1 normal and S2 normal. Tachycardia present. Exam reveals no gallop and no friction rub.  No murmur heard. Pulmonary/Chest: Effort normal. Tachypnea noted. No respiratory distress. She has rales. She exhibits no tenderness.  Abdominal: Soft. Normal appearance and bowel sounds are normal. There is no hepatosplenomegaly. There is no tenderness. There is no rebound, no guarding, no tenderness at McBurney's point and negative Murphy's sign. No hernia.  Musculoskeletal: Normal range of motion.  Neurological: She is alert. She has normal strength. She is disoriented. No cranial nerve deficit or sensory deficit. Coordination normal. GCS eye subscore is 4. GCS verbal subscore is 5. GCS motor subscore is 6.  Skin: Skin is warm, dry and intact. No rash noted. No cyanosis.  Psychiatric: She has a normal mood and affect. Her speech is normal and behavior is normal. Thought content normal.  Nursing note and vitals reviewed.    ED Treatments / Results  Labs (all labs ordered are listed, but only abnormal results are displayed) Labs Reviewed  URINE CULTURE  URINALYSIS, ROUTINE W REFLEX MICROSCOPIC    EKG None  Radiology No results found.  Procedures Procedures (including critical care  time)  Medications Ordered in ED Medications  acetaminophen (TYLENOL) suppository 650 mg (has no administration in time range)     Initial Impression / Assessment and Plan / ED Course  I have reviewed the triage vital signs and the nursing notes.  Pertinent labs & imaging results that were available during my care of the patient were reviewed by me and considered in my medical decision making (see chart for details).     Patient transferred to emergency department by EMS from nursing home for evaluation of fever.  Patient with 102.4 temperature rectally at arrival.  Patient appeared agitated and uncomfortable.  She was noted to be tachypneic but not hypoxic.  She has mild leukocytosis but no lactic acidosis or hypotension.  Urinalysis shows obvious infection.  This is consistent with sepsis secondary to urinary pathogen.  She was initiated on Rocephin.  Blood and urine cultures pending.  Patient noted to be intermittently bradycardic followed by episodes of nonsustained V. tach here in the ER.  She is noted to be very hypokalemic,  magnesium pending.  Discussed with on-call cardiology, recommended addressing electrolyte abnormality before any antiarrhythmics.  Patient will be admitted to the hospitalist service.  CRITICAL CARE Performed by: Orpah Greek   Total critical care time: 30 minutes  Critical care time was exclusive of separately billable procedures and treating other patients.  Critical care was necessary to treat or prevent imminent or life-threatening deterioration.  Critical care was time spent personally by me on the following activities: development of treatment plan with patient and/or surrogate as well as nursing, discussions with consultants, evaluation of patient's response to treatment, examination of patient, obtaining history from patient or surrogate, ordering and performing treatments and interventions, ordering and review of laboratory studies, ordering  and review of radiographic studies, pulse oximetry and re-evaluation of patient's condition.   Final Clinical Impressions(s) / ED Diagnoses   Final diagnoses:  None    ED Discharge Orders    None       Pollina, Gwenyth Allegra, MD 04/15/18 [phone]

## 2018-04-16 LAB — BASIC METABOLIC PANEL
ANION GAP: 7 (ref 5–15)
BUN: 8 mg/dL (ref 8–23)
CO2: 19 mmol/L — ABNORMAL LOW (ref 22–32)
Calcium: 7.3 mg/dL — ABNORMAL LOW (ref 8.9–10.3)
Chloride: 117 mmol/L — ABNORMAL HIGH (ref 98–111)
Creatinine, Ser: 0.84 mg/dL (ref 0.44–1.00)
GFR, EST NON AFRICAN AMERICAN: 58 mL/min — AB (ref >60)
Glucose, Bld: 116 mg/dL — ABNORMAL HIGH (ref 70–99)
POTASSIUM: 3 mmol/L — AB (ref 3.5–5.1)
SODIUM: 143 mmol/L (ref 135–145)

## 2018-04-16 LAB — GLUCOSE, CAPILLARY
GLUCOSE-CAPILLARY: 115 mg/dL — AB (ref 70–99)
GLUCOSE-CAPILLARY: 93 mg/dL (ref 70–99)
Glucose-Capillary: 67 mg/dL — ABNORMAL LOW (ref 70–99)
Glucose-Capillary: 83 mg/dL (ref 70–99)

## 2018-04-16 MED ORDER — DEXTROSE 50 % IV SOLN
INTRAVENOUS | Status: AC
  Administered 2018-04-16: 06:00:00
  Filled 2018-04-16: qty 50

## 2018-04-16 MED ORDER — IPRATROPIUM-ALBUTEROL 0.5-2.5 (3) MG/3ML IN SOLN
3.0000 mL | Freq: Four times a day (QID) | RESPIRATORY_TRACT | Status: DC
  Administered 2018-04-16: 3 mL via RESPIRATORY_TRACT

## 2018-04-16 MED ORDER — POTASSIUM CHLORIDE 10 MEQ/100ML IV SOLN
10.0000 meq | INTRAVENOUS | Status: AC
  Administered 2018-04-16 (×6): 10 meq via INTRAVENOUS
  Filled 2018-04-16 (×6): qty 100

## 2018-04-16 MED ORDER — IPRATROPIUM-ALBUTEROL 0.5-2.5 (3) MG/3ML IN SOLN
3.0000 mL | Freq: Two times a day (BID) | RESPIRATORY_TRACT | Status: DC
  Administered 2018-04-17: 3 mL via RESPIRATORY_TRACT
  Filled 2018-04-16: qty 3

## 2018-04-16 MED ORDER — DEXTROSE 5 % IV SOLN
INTRAVENOUS | Status: DC
  Administered 2018-04-16 – 2018-04-19 (×5): via INTRAVENOUS

## 2018-04-16 MED ORDER — IPRATROPIUM-ALBUTEROL 0.5-2.5 (3) MG/3ML IN SOLN
3.0000 mL | RESPIRATORY_TRACT | Status: DC | PRN
  Administered 2018-04-16: 3 mL via RESPIRATORY_TRACT
  Filled 2018-04-16: qty 3

## 2018-04-16 MED ORDER — METHYLPREDNISOLONE SODIUM SUCC 40 MG IJ SOLR
40.0000 mg | Freq: Every day | INTRAMUSCULAR | Status: DC
  Administered 2018-04-16 – 2018-04-18 (×3): 40 mg via INTRAVENOUS
  Filled 2018-04-16 (×3): qty 1

## 2018-04-16 MED ORDER — IPRATROPIUM-ALBUTEROL 0.5-2.5 (3) MG/3ML IN SOLN: 3.0000 mL | RESPIRATORY_TRACT | Status: DC | PRN

## 2018-04-16 NOTE — Progress Notes (Signed)
Blood glucose:  67.  Administered 25g/50 ml of 50% dextrose.  Exp: 05/18/2018 Lot: [id number] Comp: 02/17/2018

## 2018-04-16 NOTE — Plan of Care (Signed)
Patient does not communicate well.  Only a few words can be understood.  Patient does not understand plan of care.  No teach back displayed.

## 2018-04-16 NOTE — Progress Notes (Signed)
PROGRESS NOTE    [place]  JEH:[id number] DOB: September 23, 1921 DOA: 04/15/2018 PCP: Hendricks Limes, MD    Brief Narrative:  84 y.o. female with history of vascular dementia, hypothyroidism chronic anemia was brought to the ER after patient was found to have fever.  Patient also was noticed to have confusion.  Exact history is not clear.  ED Course: In the ER patient was confused and agitated initially.  Had brief runs of nonsustained V. tach labs show free hypokalemia will also signs of sepsis with UTI.  Patient was given fluids and started on empiric antibiotics.  While in the ER patient had bradycardic episodes.  Patient has a DNR status which was changed to limited code by patient's daughter with okay for ACLS medications and defibrillation  Assessment & Plan:   Principal Problem:   Sepsis ([place]) Active Problems:   Aortic stenosis   Hypothyroidism   Lower urinary tract infectious disease   Vascular dementia   Chronic anemia   Pressure injury of skin  1. Sepsis of UTI patient is placed on cefepime.   1. Urine culture pending 2. Patient is continued on cefepime 3. Afebrile. Leukocytosis of 13k 4. Repeat CBC in AM 5. Given advanced age, high risk for mortality 2. Runs of nonsustained V. tach and bradycardic episodes.   1. Cardiology consulted 2. Recommendation for aggressive electrolyte correction. K noted to be in the 2 range at time of presentation 3. Patient remains bradycardic 4. Continue on tele 3. Hypothyroidism on Synthroid  1. Appears to be drowsy, will continue on IV formulation for now 4. Chronic anemia  1. Hemodynamically stable at this time 2. Will repeat cbc in AM 5. History of TIA on Plavix.   1. Drowsy this AM 2. Presently NPO, plan to resume diet when more awake 3. Continued on D5 IVF for now 6. Vascular dementia presently confused. 1. Seems stable 7. Possible COPD exacerbation 1. Emphysematous changes on CXR, reviewed personally 2. Wheezing on  exam. 3. Will schedule neb tx and start IV steroids  DVT prophylaxis: Lovenox subq Code Status: Partial (No intubation or chest compression) Family Communication: Pt in room, family not at bedside Disposition Plan: Uncertain at this time  Consultants:   Cardiology  Procedures:     Antimicrobials: Anti-infectives (From admission, onward)   Start     Dose/Rate Route Frequency Ordered Stop   04/15/18 1000  ceFEPIme (MAXIPIME) 1 g in sodium chloride 0.9 % 100 mL IVPB     1 g 200 mL/hr over 30 Minutes Intravenous Every 24 hours 04/15/18 0618     04/15/18 0145  cefTRIAXone (ROCEPHIN) 1 g in sodium chloride 0.9 % 100 mL IVPB  Status:  Discontinued     1 g 200 mL/hr over 30 Minutes Intravenous Every 24 hours 04/15/18 0142 04/15/18 3785       Subjective: Without complaints this AM  Objective: Vitals:   04/15/18 1900 04/15/18 2300 04/16/18 0840 04/16/18 1152  BP: (!) 169/75 (!) 126/98 (!) 145/58   Pulse: (!) 50 73 (!) 52   Resp: (!) 22 (!) 22 (!) 26   Temp: 97.8 F (36.6 C) 98.9 F (37.2 C) 98 F (36.7 C)   TempSrc: Axillary Oral Oral   SpO2: 97% 100% 100% 100%  Weight:      Height:        Intake/Output Summary (Last 24 hours) at 04/16/2018 1612 Last data filed at 04/16/2018 0600 Gross per 24 hour  Intake 839.52 ml  Output 501  ml  Net 338.52 ml   Filed Weights   04/15/18 0113  Weight: 49.9 kg    Examination:  General exam: Appears calm and comfortable  Respiratory system: Clear to auscultation. Respiratory effort normal. End-expiratory wheezing Cardiovascular system: S1 & S2 heard, RRR Gastrointestinal system: Abdomen is nondistended, soft and nontender. No organomegaly or masses felt. Normal bowel sounds heard. Central nervous system: Alert and oriented. No focal neurological deficits. Extremities: Symmetric 5 x 5 power. Skin: No rashes, lesions Psychiatry: Judgement and insight appear normal. Mood & affect appropriate.   Data Reviewed: I have personally  reviewed following labs and imaging studies  CBC: Recent Labs  Lab 04/12/18 04/15/18 0123 04/15/18 0702  WBC 13.7 12.2* 13.6*  NEUTROABS 12 9.5* 11.0*  HGB 8.4* 9.7* 8.8*  HCT 26* 30.9* 27.7*  MCV  --  95.7 95.5  PLT 194 225 759   Basic Metabolic Panel: Recent Labs  Lab 04/15/18 0123 04/15/18 0231 04/15/18 0702 04/16/18 0859  NA 142  --  139 143  K 2.6*  --  3.1* 3.0*  CL 108  --  110 117*  CO2 23  --  21* 19*  GLUCOSE 115*  --  111* 116*  BUN 11  --  12 8  CREATININE 0.94  --  0.92 0.84  CALCIUM 8.2*  --  7.6* 7.3*  MG  --  2.0 2.2  --    GFR: Estimated Creatinine Clearance: 33 mL/min (by C-G formula based on SCr of 0.84 mg/dL). Liver Function Tests: Recent Labs  Lab 04/15/18 0123 04/15/18 0702  AST 26 22  ALT 28 24  ALKPHOS 72 66  BILITOT 0.8 0.7  PROT 5.3* 5.0*  ALBUMIN 2.2* 2.0*   No results for input(s): LIPASE, AMYLASE in the last 168 hours. No results for input(s): AMMONIA in the last 168 hours. Coagulation Profile: Recent Labs  Lab 04/15/18 0702  INR 1.28   Cardiac Enzymes: Recent Labs  Lab 04/15/18 0702 04/15/18 1250 04/15/18 1859  TROPONINI 0.28* 0.09* 0.06*   BNP (last 3 results) No results for input(s): PROBNP in the last 8760 hours. HbA1C: No results for input(s): HGBA1C in the last 72 hours. CBG: Recent Labs  Lab 04/15/18 1558 04/15/18 2001 04/16/18 0025 04/16/18 0723 04/16/18 1235  GLUCAP 79 75 67* 115* 93   Lipid Profile: No results for input(s): CHOL, HDL, LDLCALC, TRIG, CHOLHDL, LDLDIRECT in the last 72 hours. Thyroid Function Tests: Recent Labs    04/15/18 0702  TSH 8.630*   Anemia Panel: No results for input(s): VITAMINB12, FOLATE, FERRITIN, TIBC, IRON, RETICCTPCT in the last 72 hours. Sepsis Labs: Recent Labs  Lab 04/15/18 0130 04/15/18 0319 04/15/18 0702 04/15/18 1638  PROCALCITON  --   --  1.45  --   LATICACIDVEN 1.38 0.99 1.5 1.2    Recent Results (from the past 240 hour(s))  Urine culture      Status: Abnormal (Preliminary result)   Collection Time: 04/15/18 12:57 AM  Result Value Ref Range Status   Specimen Description URINE, CATHETERIZED  Final   Special Requests   Final    NONE Performed at [facility], [address] [place], [place] [address]    Culture >=100,000 COLONIES/mL ESCHERICHIA COLI (A)  Final   Report Status PENDING  Incomplete  Culture, blood (Routine X 2) w Reflex to ID Panel     Status: None (Preliminary result)   Collection Time: 04/15/18  1:06 AM  Result Value Ref Range Status   Specimen Description  BLOOD RIGHT FOREARM  Final   Special Requests   Final    BOTTLES DRAWN AEROBIC AND ANAEROBIC Blood Culture adequate volume   Culture   Final    NO GROWTH 1 DAY Performed at [facility], [address] [place], [place] [address]    Report Status PENDING  Incomplete  Culture, blood (Routine X 2) w Reflex to ID Panel     Status: None (Preliminary result)   Collection Time: 04/15/18  1:10 AM  Result Value Ref Range Status   Specimen Description BLOOD LEFT ANTECUBITAL  Final   Special Requests   Final    BOTTLES DRAWN AEROBIC AND ANAEROBIC Blood Culture adequate volume   Culture   Final    NO GROWTH 1 DAY Performed at [facility], [place] [address] [place], [place] [address]    Report Status PENDING  Incomplete  MRSA PCR Screening     Status: None   Collection Time: 04/15/18  6:58 AM  Result Value Ref Range Status   MRSA by PCR NEGATIVE NEGATIVE Final    Comment:        The GeneXpert MRSA Assay (FDA approved for NASAL specimens only), is one component of a comprehensive MRSA colonization surveillance program. It is not intended to diagnose MRSA infection nor to guide or monitor treatment for MRSA infections. Performed at [facility], [place] [address], [place], [place] [address]      Radiology Studies: Dg Chest Portable 1 View  Result Date: 04/15/2018 CLINICAL DATA:  Fever this evening. EXAM: PORTABLE CHEST 1 VIEW  COMPARISON:  12/08/2016 FINDINGS: Examination is technically limited due to patient rotation. As visualized, the heart size and pulmonary vascularity appear normal. Probable emphysematous changes and scattered fibrosis in the lungs. No focal airspace disease or consolidation is identified. No blunting of costophrenic angles. No pneumothorax. Calcification of the aorta. Postoperative changes in the cervical spine. IMPRESSION: Probable emphysematous changes in the lungs. No evidence of active pulmonary disease. Electronically Signed   By: Lucienne Capers M.D.   On: 04/15/2018 01:25    Scheduled Meds: . atropine  0.4 mg Intravenous Once  . enoxaparin (LOVENOX) injection  30 mg Subcutaneous Daily  . ipratropium-albuterol  3 mL Nebulization Q6H  . levothyroxine  44 mcg Intravenous Daily  . methylPREDNISolone (SOLU-MEDROL) injection  40 mg Intravenous Daily   Continuous Infusions: . ceFEPime (MAXIPIME) IV 1 g (04/16/18 1136)  . dextrose    . potassium chloride 10 mEq (04/16/18 1515)     LOS: 1 day   Marylu Lund, MD [facility] Pager [phone]  If 7PM-7AM, please contact night-coverage [email or web address] Password [facility] 04/16/2018, 4:12 PM

## 2018-04-17 LAB — BASIC METABOLIC PANEL
Anion gap: 9 (ref 5–15)
BUN: 8 mg/dL (ref 8–23)
CHLORIDE: 111 mmol/L (ref 98–111)
CO2: 17 mmol/L — ABNORMAL LOW (ref 22–32)
Calcium: 7.5 mg/dL — ABNORMAL LOW (ref 8.9–10.3)
Creatinine, Ser: 0.74 mg/dL (ref 0.44–1.00)
GFR calc Af Amer: >60 mL/min (ref >60)
GFR calc non Af Amer: >60 mL/min (ref >60)
Glucose, Bld: 198 mg/dL — ABNORMAL HIGH (ref 70–99)
Potassium: 4.1 mmol/L (ref 3.5–5.1)
SODIUM: 137 mmol/L (ref 135–145)

## 2018-04-17 LAB — CBC
HCT: 27.6 % — ABNORMAL LOW (ref 36.0–46.0)
Hemoglobin: 8.8 g/dL — ABNORMAL LOW (ref 12.0–15.0)
MCH: 29.6 pg (ref 26.0–34.0)
MCHC: 31.9 g/dL (ref 30.0–36.0)
MCV: 92.9 fL (ref 78.0–100.0)
PLATELETS: 231 10*3/uL (ref 150–400)
RBC: 2.97 MIL/uL — ABNORMAL LOW (ref 3.87–5.11)
RDW: 15.7 % — ABNORMAL HIGH (ref 11.5–15.5)
WBC: 8.5 10*3/uL (ref 4.0–10.5)

## 2018-04-17 NOTE — Progress Notes (Signed)
Progress Note  Patient Name: Jacqueline Robles Date of Encounter: 04/17/2018  Primary Cardiologist: new, [facility] [place]   Subjective   84 year old female with severe dementia, aortic stenosis who presents with fever and a urinary tract infection.  She has atrial ablation and also episodes of polymorphic ventricular tachycardia.  Inpatient Medications    Scheduled Meds: . atropine  0.4 mg Intravenous Once  . levothyroxine  44 mcg Intravenous Daily  . methylPREDNISolone (SOLU-MEDROL) injection  40 mg Intravenous QPC lunch   Continuous Infusions: . ceFEPime (MAXIPIME) IV 1 g (04/17/18 0942)  . dextrose 75 mL/hr at 04/17/18 0602   PRN Meds: acetaminophen **OR** acetaminophen, ipratropium-albuterol, ondansetron **OR** ondansetron (ZOFRAN) IV   Vital Signs    Vitals:   04/17/18 0300 04/17/18 0822 04/17/18 0823 04/17/18 0933  BP: (!) 152/72 (!) 167/73 (!) 162/64   Pulse: 61 (!) 56 (!) 53 (!) 51  Resp: 20 (!) 26  16  Temp: 98.1 F (36.7 C)     TempSrc: Axillary     SpO2: 97% 100%  100%  Weight:      Height:        Intake/Output Summary (Last 24 hours) at 04/17/2018 1206 Last data filed at 04/17/2018 5361 Gross per 24 hour  Intake 1948.1 ml  Output -  Net 1948.1 ml   Filed Weights   04/15/18 0113  Weight: 49.9 kg    Telemetry    Sinus brady at 20  - Personally Reviewed  ECG    AF  - Personally Reviewed  Physical Exam   GEN:  Elderly female.  She appears to be chronically ill and appears to be very pale. Neck: No JVD Cardiac:  Rate.  3/6 to 4/6 systolic murmur at the left sternal border radiating to the right sternal border. Respiratory: Clear to auscultation bilaterally. GI: Soft, nontender, non-distended  MS: No edema; No deformity. Neuro:   Severely demented.  Unable to cooperate with the exam. Psych: Severe dementia  Labs    Chemistry Recent Labs  Lab 04/15/18 0123 04/15/18 0702 04/16/18 0859 04/17/18 0242  NA 142 139 143 137  K 2.6* 3.1* 3.0* 4.1  CL  108 110 117* 111  CO2 23 21* 19* 17*  GLUCOSE 115* 111* 116* 198*  BUN 11 12 8 8   CREATININE 0.94 0.92 0.84 0.74  CALCIUM 8.2* 7.6* 7.3* 7.5*  PROT 5.3* 5.0*  --   --   ALBUMIN 2.2* 2.0*  --   --   AST 26 22  --   --   ALT 28 24  --   --   ALKPHOS 72 66  --   --   BILITOT 0.8 0.7  --   --   GFRNONAA 51* 52* 58* >60  GFRAA 59* >60 >60 >60  ANIONGAP 11 8 7 9      Hematology Recent Labs  Lab 04/15/18 0123 04/15/18 0702 04/17/18 0242  WBC 12.2* 13.6* 8.5  RBC 3.23* 2.90* 2.97*  HGB 9.7* 8.8* 8.8*  HCT 30.9* 27.7* 27.6*  MCV 95.7 95.5 92.9  MCH 30.0 30.3 29.6  MCHC 31.4 31.8 31.9  RDW 15.9* 16.0* 15.7*  PLT 225 209 231    Cardiac Enzymes Recent Labs  Lab 04/15/18 0702 04/15/18 1250 04/15/18 1859  TROPONINI 0.28* 0.09* 0.06*   No results for input(s): TROPIPOC in the last 168 hours.   BNPNo results for input(s): BNP, PROBNP in the last 168 hours.   DDimer No results for input(s): DDIMER in the last 168  hours.   Radiology    No results found.  Cardiac Studies     Patient Profile     84 y.o. female with severe dementia was admitted with a urinary infection with sepsis.  She had episodes of atrial fibrillation and polymorphic ventricular tachycardia in the setting of a very low potassium level.  Assessment & Plan    1.  Polymorphic ventricular tachycardia: This appears to have stabilized with repletion of her potassium.  Acid level has increased from 2.6 up to 4.1.  She is in normal sinus rhythm currently. It appears that her polymorphic ventricular tachycardia was due to her letter light ab normality's I would recommend keeping her electrolytes  Stable she is discharged.  .  Bradycardia: Her bradycardia has largely resolved.  She is not a candidate for pacemaker.  3.  Aortic stenosis: Medical therapy.  She is not a candidate for any invasive procedures.   [facility] will sign off.   Medication Recommendations:  Continue supportive care  Other  recommendations (labs, testing, etc):   Follow up as an outpatient:  With her primary MD or MD at the SNF . She does not need cardiology follow up .  For questions or updates, please contact [place] Please consult [email or web address] for contact info under Cardiology/STEMI.      Signed, Mertie Moores, MD  04/17/2018, 12:06 PM

## 2018-04-17 NOTE — Consult Note (Signed)
Consultation Note Date: 04/18/2018   Patient Name: Jacqueline Robles  DOB: 30-Jun-1922  MRN: [id number]  Age / Sex: 84 y.o., female  PCP: Hendricks Limes, MD Referring Physician: Donne Hazel, MD  Reason for Consultation: Establishing goals of care  HPI/Patient Profile: 84 y.o. female  with past medical history of advanced vascular dementia, HTN, HLD, COPD, hypothyroidism, h/o C. diff colitis, osteoporosis, depression, UTI admitted on 04/15/2018 with fever r/t UTI sepsis with hypokalemia causing bradycardia and nonsustained VT. Heart rate has improved with correction of electrolytes (although cardiology deemed her not a good candidate for pacemaker placement).   Clinical Assessment and Goals of Care: I met today at Ms. Lamontagne's bedside. She is pleasant but very confused and unable to participate in [place] conversation although she does nod her head yes/no but unable to verbalize where she is or even her name.   I spoke further with Ms. Yingling's daughter, Opal Sidles, who is an Therapist, sports (retired). Opal Sidles has good understanding of her mother's medical condition and her main concerns are for the care that she is receiving in her long term care facility. Would like CSW to assist with beginning process to search for a different facility if possible. Concerned that with better care and more frequent bathing and changing of diapers her mother would be less likely to get UTIs.   Functionally Ms. Gadway was able to feed herself but unable to complete any further ADLs or assist with transfer. She would sit up in wheelchair. Incontinent. Opal Sidles says that her mother had not had any swallowing issues and has actually had increased appetite and intake lately.   As far as Gwyneth Sprout agrees that her mother should return to DNR status upon discharge from the hospital. She would not want surgery (discussed not a candidate for pacemaker) or aggressive  interventions. She would not want her mother to have feeding tube and would opt for comfort feeds and hospice if necessary. Her father died years ago under hospice care and although we do not feel Ms. Feijoo is likely hospice appropriate yet Opal Sidles is open to the when she is eligible (will discuss further depending on SLP eval/recs). Opal Sidles asks about [employer] and educated that this would be for prognosis <2 weeks. We did discuss the addition of outpatient palliative care to help [name] advocate and hook in with hospice when appropriate and Opal Sidles agrees.   All questions/concerns addressed. Emotional support provided.   Primary Decision Maker NEXT OF KIN 4 children (Opal Sidles, 1 son in [place], 1 son with unknown whereabouts, 1 son with dementia in facility himself)    SUMMARY OF RECOMMENDATIONS   - DNR when leaves the hospital - Outpatient palliative at SNF (unless she becomes hospice eligible) - CSW consulted for other long term bed options - No feeding tubes or aggressive interventions  Code Status/Advance Care Planning:  Limited code - okay for defib while hospitalized   Symptom Management:   Arthritic pain: Warm compress and blankets qhs and prn. Tylenol prn.   Dysphagia:  Was on mechanical soft diet with thin liquids PTA. No feeding tubes.   Palliative Prophylaxis:   Aspiration, Bowel Regimen, Delirium Protocol, Frequent Pain Assessment, Oral Care and Turn Reposition  Additional Recommendations (Limitations, Scope, Preferences):  Avoid Hospitalization, No Artificial Feeding and No Surgical Procedures  Psycho-social/Spiritual:   Desire for further Chaplaincy support:no  Additional Recommendations: Caregiving  Support/Resources and Education on Hospice  Prognosis:   Unable to determine  Discharge Planning: SNF with palliative most likely     Primary Diagnoses: Present on Admission: . Sepsis ([place]) . Vascular dementia . Lower urinary tract infectious disease .  Hypothyroidism . Chronic anemia   I have reviewed the medical record, interviewed the patient and family, and examined the patient. The following aspects are pertinent.  Past Medical History:  Diagnosis Date  . C. difficile colitis   . Depression   . Hyperlipidemia   . Hypertension   . Osteoporosis   . Thyroid disease   . UTI (lower urinary tract infection)    Social History   Socioeconomic History  . Marital status: Widowed    Spouse name: Not on file  . Number of children: Not on file  . Years of education: Not on file  . Highest education level: Not on file  Occupational History  . Not on file  Social Needs  . Financial resource strain: Not hard at all  . Food insecurity:    Worry: Never true    Inability: Never true  . Transportation needs:    Medical: No    Non-medical: No  Tobacco Use  . Smoking status: Never Smoker  . Smokeless tobacco: Never Used  Substance and Sexual Activity  . Alcohol use: No  . Drug use: No  . Sexual activity: Not on file  Lifestyle  . Physical activity:    Days per week: 0 days    Minutes per session: 0 min  . Stress: Not at all  Relationships  . Social connections:    Talks on phone: Twice a week    Gets together: Twice a week    Attends religious service: Never    Active member of club or organization: No    Attends meetings of clubs or organizations: Never    Relationship status: Widowed  Other Topics Concern  . Not on file  Social History Narrative   Family is very involved in care.   Family History  Problem Relation Age of Onset  . Hypertension Son    Scheduled Meds: . atropine  0.4 mg Intravenous Once  . levothyroxine  44 mcg Intravenous Daily  . methylPREDNISolone (SOLU-MEDROL) injection  40 mg Intravenous QPC lunch   Continuous Infusions: . ceFEPime (MAXIPIME) IV 1 g (04/17/18 0942)  . dextrose 75 mL/hr at 04/17/18 0602   PRN Meds:.acetaminophen **OR** acetaminophen, ipratropium-albuterol, ondansetron **OR**  ondansetron (ZOFRAN) IV Allergies  Allergen Reactions  . Fosamax [Alendronate Sodium] Other (See Comments)    Unknown, family is stating there is no allergic/adverse reaction to drug it was just discontinued.  [name] Hctz [Hydrochlorothiazide] Other (See Comments)    unknownUnknown, family is stating there is no allergic/adverse reaction to drug it was just discontinued.  . Latex     Unknown, family is stating there is no allergic/adverse reaction to drug it was just discontinued.  . Neosporin [Neomycin-Bacitracin Zn-Polymyx] Other (See Comments)    On MAR  . Norvasc [Amlodipine Besylate]     Unknown, family is stating there is no allergic/adverse reaction to drug it was just  discontinued.  . Bacitracin-Neomycin-Polymyxin Rash  . Lanolin Rash  . Naproxen Rash  . Polysporin [Bacitracin-Polymyxin B] Rash    rash   Review of Systems  Unable to perform ROS: Dementia    Physical Exam  Constitutional: She appears well-developed.  HENT:  Head: Normocephalic and atraumatic.  Cardiovascular: Bradycardia present.  Occasional PVC  Pulmonary/Chest: Effort normal. No accessory muscle usage. No tachypnea. No respiratory distress.  Abdominal: Normal appearance.  Neurological: She is disoriented.  Sleepy but arousable   Nursing note and vitals reviewed.   Vital Signs: BP (!) 162/64   Pulse (!) 51   Temp 98.1 F (36.7 C) (Axillary)   Resp 16   Ht _0  (1.575 m)   Wt 49.9 kg   SpO2 100%   BMI 20.12 kg/m  Pain Scale: Faces   Pain Score: Asleep   SpO2: SpO2: 100 % O2 Device:SpO2: 100 % O2 Flow Rate: .   IO: Intake/output summary:   Intake/Output Summary (Last 24 hours) at 04/17/2018 1221 Last data filed at 04/17/2018 5583 Gross per 24 hour  Intake 1948.1 ml  Output -  Net 1948.1 ml    LBM: Last BM Date: 04/16/18 Baseline Weight: Weight: 49.9 kg Most recent weight: Weight: 49.9 kg     Palliative Assessment/Data: 30%     Time Total: 80 min  Greater than 50%  of this  time was spent counseling and coordinating care related to the above assessment and plan.  Signed by: Vinie Sill, NP Palliative Medicine Team Pager # [phone] (M-F 8a-5p) Team Phone # [phone] (Nights/Weekends)

## 2018-04-17 NOTE — Progress Notes (Signed)
PROGRESS NOTE    [place]  IRJ:[id number] DOB: 1922/02/26 DOA: 04/15/2018 PCP: Hendricks Limes, MD    Brief Narrative:  84 y.o. female with history of vascular dementia, hypothyroidism chronic anemia was brought to the ER after patient was found to have fever.  Patient also was noticed to have confusion.  Exact history is not clear.  ED Course: In the ER patient was confused and agitated initially.  Had brief runs of nonsustained V. tach labs show free hypokalemia will also signs of sepsis with UTI.  Patient was given fluids and started on empiric antibiotics.  While in the ER patient had bradycardic episodes.  Patient has a DNR status which was changed to limited code by patient's daughter with okay for ACLS medications and defibrillation  Assessment & Plan:   Principal Problem:   Sepsis ([place]) Active Problems:   Aortic stenosis   Hypothyroidism   Lower urinary tract infectious disease   Vascular dementia   Chronic anemia   Pressure injury of skin  1. Sepsis of ecoli UTI patient is placed on cefepime, sepsis present on admit 1. Urine culture pending 2. Patient is continued on cefepime, pending sensitivities 3. Afebrile.Leukocytosis normalized 4. Given advanced age, high risk for mortality 5. Family wishing to speak with palliative care. Consulted 2. Runs of nonsustained V. tach and bradycardic episodes.   1. Cardiology consulted 2. Recommendation for aggressive electrolyte correction. K noted to be in the 2 range at time of presentation 3. Patient remains bradycardic, better. In sinus currently 4. Cardiology recommendations to keep electrolytes normal. Cardiology signed off 3. Hypothyroidism on Synthroid  1. Continue on thyroid replacement as tolerated 4. Chronic anemia  1. Hemodynamically stable at this time 2. No evidence of acute bleed 5. History of TIA on Plavix.   1. Drowsy this AM 2. Continued on D5 fluids 6. Vascular dementia presently confused. 1. Seems  stable at this time 7. Possible COPD exacerbation 1. Emphysematous changes on CXR, reviewed personally 2. Lungs clear this AM on exam. 3. Continued on scheduled neb tx and start IV steroids  DVT prophylaxis: Lovenox subq Code Status: Partial (No intubation or chest compression) Family Communication: Pt in room, family not at bedside Disposition Plan: Uncertain at this time  Consultants:   Cardiology  Palliative care  Procedures:     Antimicrobials: Anti-infectives (From admission, onward)   Start     Dose/Rate Route Frequency Ordered Stop   04/15/18 1000  ceFEPIme (MAXIPIME) 1 g in sodium chloride 0.9 % 100 mL IVPB     1 g 200 mL/hr over 30 Minutes Intravenous Every 24 hours 04/15/18 0618     04/15/18 0145  cefTRIAXone (ROCEPHIN) 1 g in sodium chloride 0.9 % 100 mL IVPB  Status:  Discontinued     1 g 200 mL/hr over 30 Minutes Intravenous Every 24 hours 04/15/18 0142 04/15/18 0632      Subjective: No complaints  Objective: Vitals:   04/17/18 0823 04/17/18 0933 04/17/18 1352 04/17/18 1702  BP: (!) 162/64  (!) 135/59 (!) 150/64  Pulse: (!) 53 (!) 51 (!) 55 (!) 55  Resp:  16 (!) 26 (!) 28  Temp:   (!) 97.5 F (36.4 C) (!) 97.5 F (36.4 C)  TempSrc:   Oral Oral  SpO2:  100% 99% 98%  Weight:      Height:        Intake/Output Summary (Last 24 hours) at 04/17/2018 1737 Last data filed at 04/17/2018 0942 Gross per 24 hour  Intake 979.6 ml  Output -  Net 979.6 ml   Filed Weights   04/15/18 0113  Weight: 49.9 kg    Examination: General exam: Conversant, in no acute distress Respiratory system: normal chest rise, clear, no audible wheezing Cardiovascular system: regular rhythm, s1-s2 Gastrointestinal system: Nondistended, nontender, pos BS Central nervous system: No seizures, no tremors Extremities: No cyanosis, no joint deformities Skin: No rashes, no pallor Psychiatry: Affect normal // no auditory hallucinations    Data Reviewed: I have personally  reviewed following labs and imaging studies  CBC: Recent Labs  Lab 04/12/18 04/15/18 0123 04/15/18 0702 04/17/18 0242  WBC 13.7 12.2* 13.6* 8.5  NEUTROABS 12 9.5* 11.0*  --   HGB 8.4* 9.7* 8.8* 8.8*  HCT 26* 30.9* 27.7* 27.6*  MCV  --  95.7 95.5 92.9  PLT 194 225 209 956   Basic Metabolic Panel: Recent Labs  Lab 04/15/18 0123 04/15/18 0231 04/15/18 0702 04/16/18 0859 04/17/18 0242  NA 142  --  139 143 137  K 2.6*  --  3.1* 3.0* 4.1  CL 108  --  110 117* 111  CO2 23  --  21* 19* 17*  GLUCOSE 115*  --  111* 116* 198*  BUN 11  --  12 8 8   CREATININE 0.94  --  0.92 0.84 0.74  CALCIUM 8.2*  --  7.6* 7.3* 7.5*  MG  --  2.0 2.2  --   --    GFR: Estimated Creatinine Clearance: 34.6 mL/min (by C-G formula based on SCr of 0.74 mg/dL). Liver Function Tests: Recent Labs  Lab 04/15/18 0123 04/15/18 0702  AST 26 22  ALT 28 24  ALKPHOS 72 66  BILITOT 0.8 0.7  PROT 5.3* 5.0*  ALBUMIN 2.2* 2.0*   No results for input(s): LIPASE, AMYLASE in the last 168 hours. No results for input(s): AMMONIA in the last 168 hours. Coagulation Profile: Recent Labs  Lab 04/15/18 0702  INR 1.28   Cardiac Enzymes: Recent Labs  Lab 04/15/18 0702 04/15/18 1250 04/15/18 1859  TROPONINI 0.28* 0.09* 0.06*   BNP (last 3 results) No results for input(s): PROBNP in the last 8760 hours. HbA1C: No results for input(s): HGBA1C in the last 72 hours. CBG: Recent Labs  Lab 04/15/18 2001 04/16/18 0025 04/16/18 0723 04/16/18 1235 04/16/18 1616  GLUCAP 75 67* 115* 93 83   Lipid Profile: No results for input(s): CHOL, HDL, LDLCALC, TRIG, CHOLHDL, LDLDIRECT in the last 72 hours. Thyroid Function Tests: Recent Labs    04/15/18 0702  TSH 8.630*   Anemia Panel: No results for input(s): VITAMINB12, FOLATE, FERRITIN, TIBC, IRON, RETICCTPCT in the last 72 hours. Sepsis Labs: Recent Labs  Lab 04/15/18 0130 04/15/18 0319 04/15/18 0702 04/15/18 2130  PROCALCITON  --   --  1.45  --     LATICACIDVEN 1.38 0.99 1.5 1.2    Recent Results (from the past 240 hour(s))  Urine culture     Status: Abnormal (Preliminary result)   Collection Time: 04/15/18 12:57 AM  Result Value Ref Range Status   Specimen Description URINE, CATHETERIZED  Final   Special Requests NONE  Final   Culture (A)  Final    >=100,000 COLONIES/mL ESCHERICHIA COLI SUSCEPTIBILITIES TO FOLLOW Performed at [facility], [address], [place], [place] [address]    Report Status PENDING  Incomplete  Culture, blood (Routine X 2) w Reflex to ID Panel     Status: None (Preliminary result)   Collection Time: 04/15/18  1:06 AM  Result Value Ref Range Status   Specimen Description BLOOD RIGHT FOREARM  Final   Special Requests   Final    BOTTLES DRAWN AEROBIC AND ANAEROBIC Blood Culture adequate volume   Culture   Final    NO GROWTH 2 DAYS Performed at [facility], [address], [place], [place] [address]    Report Status PENDING  Incomplete  Culture, blood (Routine X 2) w Reflex to ID Panel     Status: None (Preliminary result)   Collection Time: 04/15/18  1:10 AM  Result Value Ref Range Status   Specimen Description BLOOD LEFT ANTECUBITAL  Final   Special Requests   Final    BOTTLES DRAWN AEROBIC AND ANAEROBIC Blood Culture adequate volume   Culture   Final    NO GROWTH 2 DAYS Performed at [facility], [place] [address], [place], [place] [address]    Report Status PENDING  Incomplete  MRSA PCR Screening     Status: None   Collection Time: 04/15/18  6:58 AM  Result Value Ref Range Status   MRSA by PCR NEGATIVE NEGATIVE Final    Comment:        The GeneXpert MRSA Assay (FDA approved for NASAL specimens only), is one component of a comprehensive MRSA colonization surveillance program. It is not intended to diagnose MRSA infection nor to guide or monitor treatment for MRSA infections. Performed at [facility], [place] [address] [place], [place] [address]      Radiology  Studies: No results found.  Scheduled Meds: . atropine  0.4 mg Intravenous Once  . levothyroxine  44 mcg Intravenous Daily  . methylPREDNISolone (SOLU-MEDROL) injection  40 mg Intravenous QPC lunch   Continuous Infusions: . ceFEPime (MAXIPIME) IV 1 g (04/17/18 0942)  . dextrose 75 mL/hr at 04/17/18 0602     LOS: 2 days   Marylu Lund, MD [facility] Pager [phone]  If 7PM-7AM, please contact night-coverage [email or web address] Password [facility] 04/17/2018, 5:37 PM

## 2018-04-17 NOTE — Progress Notes (Signed)
Palliative:  I have called and scheduled meeting with Jacqueline Robles's daughter, Jacqueline Robles, who will meet me tomorrow 04/18/18 at 1400 pm. Jacqueline Robles has a son, Jacqueline Robles, who is currently in rehab for his own health concerns. Jacqueline Robles plans to meet with me along with her husband tomorrow to discuss [facility]. Thank you for this consult.   No charge  Jacqueline Sill, NP Palliative Medicine Team Pager # [phone] (M-F 8a-5p) Team Phone # [phone] (Nights/Weekends)

## 2018-04-18 DIAGNOSIS — F0150 Vascular dementia without behavioral disturbance: Secondary | ICD-10-CM

## 2018-04-18 DIAGNOSIS — Z515 Encounter for palliative care: Secondary | ICD-10-CM

## 2018-04-18 DIAGNOSIS — Z7189 Other specified counseling: Secondary | ICD-10-CM

## 2018-04-18 LAB — URINE CULTURE: Culture: >=100000 — AB

## 2018-04-18 MED ORDER — ENOXAPARIN SODIUM 40 MG/0.4ML ~~LOC~~ SOLN
40.0000 mg | SUBCUTANEOUS | Status: DC
  Administered 2018-04-18 – 2018-04-19 (×2): 40 mg via SUBCUTANEOUS
  Filled 2018-04-18 (×2): qty 0.4

## 2018-04-18 MED ORDER — FOSFOMYCIN TROMETHAMINE 3 G PO PACK
3.0000 g | PACK | Freq: Once | ORAL | Status: DC
  Filled 2018-04-18: qty 3

## 2018-04-18 NOTE — Progress Notes (Signed)
Pharmacy Antibiotic Note  Jacqueline Robles is a 84 y.o. female admitted on 04/15/2018 with UTI.  Pharmacy has been consulted for Cefepime dosing.  Day #4 of abx for E. Coli UTI. Afebrile, WBC wnl.  Plan: Cefepime 1g IV q24h F/U urine culture results Consider 5-7 days total ?  Height: 5\' 2"  (157.5 cm) Weight: 110 lb (49.9 kg) IBW/kg (Calculated) : 50.1  Temp (24hrs), Avg:97.5 F (36.4 C), Min:97.1 F (36.2 C), Max:97.6 F (36.4 C)  Recent Labs  Lab 04/12/18 04/15/18 0123 04/15/18 0130 04/15/18 0319 04/15/18 0702 04/15/18 0822 04/16/18 0859 04/17/18 0242  WBC 13.7 12.2*  --   --  13.6*  --   --  8.5  CREATININE  --  0.94  --   --  0.92  --  0.84 0.74  LATICACIDVEN  --   --  1.38 0.99 1.5 1.2  --   --     Estimated Creatinine Clearance: 34.6 mL/min (by C-G formula based on SCr of 0.74 mg/dL).    Allergies  Allergen Reactions  . Fosamax [Alendronate Sodium] Other (See Comments)    Unknown, family is stating there is no allergic/adverse reaction to drug it was just discontinued.  [name] Hctz [Hydrochlorothiazide] Other (See Comments)    unknownUnknown, family is stating there is no allergic/adverse reaction to drug it was just discontinued.  . Latex     Unknown, family is stating there is no allergic/adverse reaction to drug it was just discontinued.  . Neosporin [Neomycin-Bacitracin Zn-Polymyx] Other (See Comments)    On MAR  . Norvasc [Amlodipine Besylate]     Unknown, family is stating there is no allergic/adverse reaction to drug it was just discontinued.  . Bacitracin-Neomycin-Polymyxin Rash  . Lanolin Rash  . Naproxen Rash  . Polysporin [Bacitracin-Polymyxin B] Rash    rash     Jacqueline Robles 04/18/2018 8:49 AM

## 2018-04-18 NOTE — Progress Notes (Signed)
PROGRESS NOTE    [place]  FTD:[id number] DOB: 03/08/1922 DOA: 04/15/2018 PCP: Hendricks Limes, MD    Brief Narrative:  84 y.o. female with history of vascular dementia, hypothyroidism chronic anemia was brought to the ER after patient was found to have fever.  Patient also was noticed to have confusion.  Exact history is not clear.  ED Course: In the ER patient was confused and agitated initially.  Had brief runs of nonsustained V. tach labs show free hypokalemia will also signs of sepsis with UTI.  Patient was given fluids and started on empiric antibiotics.  While in the ER patient had bradycardic episodes.  Patient has a DNR status which was changed to limited code by patient's daughter with okay for ACLS medications and defibrillation  Assessment & Plan:   Principal Problem:   Sepsis ([place]) Active Problems:   Aortic stenosis   Hypothyroidism   Lower urinary tract infectious disease   Vascular dementia   Chronic anemia   Pressure injury of skin  1. Sepsis of ecoli UTI, sepsis present on admit 1. Urine culture demonstrates esbl ecoli. 2. Patient is continued on cefepime, pending sensitivities 3. Afebrile.Leukocytosis normalized 4. Given advanced age, high risk for mortality 5. Family wishing to speak with palliative care. Consulted 6. Will give trial of fosfomycin given esbl 2. Runs of nonsustained V. tach and bradycardic episodes.   1. Cardiology was consulted 2. Recommendation for aggressive electrolyte correction. K noted to be in the 2 range at time of presentation 3. Patient remains bradycardic, better. In sinus currently 4. Cardiology has since signed off 3. Hypothyroidism on Synthroid  1. Continue on thyroid replacement as tolerated 4. Chronic anemia  1. Hemodynamically stable at this time 2. Without evidence of bleeding 5. History of TIA on Plavix.   1. Remains drowsy but is arousable 2. Continued on D5 fluids 6. Vascular dementia presently  confused. 1. Seems stable at this time 7. Possible COPD exacerbation 1. Emphysematous changes on CXR, reviewed personally 2. Lungs clear this AM on exam. 3. Continued on scheduled neb tx with IV steroids for now 8. Dysphagia, 1. Continue NPO per SLP today  DVT prophylaxis: Lovenox subq Code Status: Partial (No intubation or chest compression) Family Communication: Pt in room, family not at bedside Disposition Plan: Uncertain at this time  Consultants:   Cardiology  Palliative care  Procedures:     Antimicrobials: Anti-infectives (From admission, onward)   Start     Dose/Rate Route Frequency Ordered Stop   04/15/18 1000  ceFEPIme (MAXIPIME) 1 g in sodium chloride 0.9 % 100 mL IVPB  Status:  Discontinued     1 g 200 mL/hr over 30 Minutes Intravenous Every 24 hours 04/15/18 0618 04/18/18 1114   04/15/18 0145  cefTRIAXone (ROCEPHIN) 1 g in sodium chloride 0.9 % 100 mL IVPB  Status:  Discontinued     1 g 200 mL/hr over 30 Minutes Intravenous Every 24 hours 04/15/18 0142 04/15/18 0623      Subjective: Without complaints  Objective: Vitals:   04/18/18 0446 04/18/18 0500 04/18/18 0735 04/18/18 1216  BP: (!) 150/80  (!) 147/71 140/64  Pulse: 60  (!) 55 (!) 50  Resp: (!) 28  17 20   Temp: 97.6 F (36.4 C)  (!) 97.5 F (36.4 C) 97.8 F (36.6 C)  TempSrc: Oral  Axillary Axillary  SpO2:  100% 100% 100%  Weight:      Height:        Intake/Output Summary (Last 24  hours) at 04/18/2018 1437 Last data filed at 04/18/2018 1421 Gross per 24 hour  Intake 775.49 ml  Output 750 ml  Net 25.49 ml   Filed Weights   04/15/18 0113  Weight: 49.9 kg    Examination: General exam: Awake, laying in bed, in nad Respiratory system: Normal respiratory effort, no wheezing Cardiovascular system: regular rate, s1, s2 Gastrointestinal system: Soft, nondistended, positive BS Central nervous system: CN2-12 grossly intact, strength intact Extremities: Perfused, no clubbing Skin: Normal  skin turgor, no notable skin lesions seen Psychiatry: Mood normal // no visual hallucinations    Data Reviewed: I have personally reviewed following labs and imaging studies  CBC: Recent Labs  Lab 04/12/18 04/15/18 0123 04/15/18 0702 04/17/18 0242  WBC 13.7 12.2* 13.6* 8.5  NEUTROABS 12 9.5* 11.0*  --   HGB 8.4* 9.7* 8.8* 8.8*  HCT 26* 30.9* 27.7* 27.6*  MCV  --  95.7 95.5 92.9  PLT 194 225 209 176   Basic Metabolic Panel: Recent Labs  Lab 04/15/18 0123 04/15/18 0231 04/15/18 0702 04/16/18 0859 04/17/18 0242  NA 142  --  139 143 137  K 2.6*  --  3.1* 3.0* 4.1  CL 108  --  110 117* 111  CO2 23  --  21* 19* 17*  GLUCOSE 115*  --  111* 116* 198*  BUN 11  --  12 8 8   CREATININE 0.94  --  0.92 0.84 0.74  CALCIUM 8.2*  --  7.6* 7.3* 7.5*  MG  --  2.0 2.2  --   --    GFR: Estimated Creatinine Clearance: 34.6 mL/min (by C-G formula based on SCr of 0.74 mg/dL). Liver Function Tests: Recent Labs  Lab 04/15/18 0123 04/15/18 0702  AST 26 22  ALT 28 24  ALKPHOS 72 66  BILITOT 0.8 0.7  PROT 5.3* 5.0*  ALBUMIN 2.2* 2.0*   No results for input(s): LIPASE, AMYLASE in the last 168 hours. No results for input(s): AMMONIA in the last 168 hours. Coagulation Profile: Recent Labs  Lab 04/15/18 0702  INR 1.28   Cardiac Enzymes: Recent Labs  Lab 04/15/18 0702 04/15/18 1250 04/15/18 1859  TROPONINI 0.28* 0.09* 0.06*   BNP (last 3 results) No results for input(s): PROBNP in the last 8760 hours. HbA1C: No results for input(s): HGBA1C in the last 72 hours. CBG: Recent Labs  Lab 04/15/18 2001 04/16/18 0025 04/16/18 0723 04/16/18 1235 04/16/18 1616  GLUCAP 75 67* 115* 93 83   Lipid Profile: No results for input(s): CHOL, HDL, LDLCALC, TRIG, CHOLHDL, LDLDIRECT in the last 72 hours. Thyroid Function Tests: No results for input(s): TSH, T4TOTAL, FREET4, T3FREE, THYROIDAB in the last 72 hours. Anemia Panel: No results for input(s): VITAMINB12, FOLATE, FERRITIN,  TIBC, IRON, RETICCTPCT in the last 72 hours. Sepsis Labs: Recent Labs  Lab 04/15/18 0130 04/15/18 0319 04/15/18 0702 04/15/18 1607  PROCALCITON  --   --  1.45  --   LATICACIDVEN 1.38 0.99 1.5 1.2    Recent Results (from the past 240 hour(s))  Urine culture     Status: Abnormal   Collection Time: 04/15/18 12:57 AM  Result Value Ref Range Status   Specimen Description URINE, CATHETERIZED  Final   Special Requests   Final    NONE Performed at [facility], [address], [place], [place] [address]    Culture (A)  Final    >=100,000 COLONIES/mL ESCHERICHIA COLI Confirmed Extended Spectrum Beta-Lactamase Producer (ESBL).  In bloodstream infections from ESBL organisms,  carbapenems are preferred over piperacillin/tazobactam. They are shown to have a lower risk of mortality.    Report Status 04/18/2018 FINAL  Final   Organism ID, Bacteria ESCHERICHIA COLI (A)  Final      Susceptibility   Escherichia coli - MIC*    AMPICILLIN >=32 RESISTANT Resistant     CEFAZOLIN >=64 RESISTANT Resistant     CEFTRIAXONE >=64 RESISTANT Resistant     CIPROFLOXACIN >=4 RESISTANT Resistant     GENTAMICIN 4 SENSITIVE Sensitive     IMIPENEM <=0.25 SENSITIVE Sensitive     NITROFURANTOIN 64 INTERMEDIATE Intermediate     TRIMETH/SULFA >=320 RESISTANT Resistant     AMPICILLIN/SULBACTAM 16 INTERMEDIATE Intermediate     PIP/TAZO <=4 SENSITIVE Sensitive     Extended ESBL POSITIVE Resistant     * >=100,000 COLONIES/mL ESCHERICHIA COLI  Culture, blood (Routine X 2) w Reflex to ID Panel     Status: None (Preliminary result)   Collection Time: 04/15/18  1:06 AM  Result Value Ref Range Status   Specimen Description BLOOD RIGHT FOREARM  Final   Special Requests   Final    BOTTLES DRAWN AEROBIC AND ANAEROBIC Blood Culture adequate volume   Culture   Final    NO GROWTH 2 DAYS Performed at [facility], [address], [place], [place] [address]    Report Status PENDING  Incomplete  Culture, blood  (Routine X 2) w Reflex to ID Panel     Status: None (Preliminary result)   Collection Time: 04/15/18  1:10 AM  Result Value Ref Range Status   Specimen Description BLOOD LEFT ANTECUBITAL  Final   Special Requests   Final    BOTTLES DRAWN AEROBIC AND ANAEROBIC Blood Culture adequate volume   Culture   Final    NO GROWTH 2 DAYS Performed at [facility], [place] [address] [place], [place] [address]    Report Status PENDING  Incomplete  MRSA PCR Screening     Status: None   Collection Time: 04/15/18  6:58 AM  Result Value Ref Range Status   MRSA by PCR NEGATIVE NEGATIVE Final    Comment:        The GeneXpert MRSA Assay (FDA approved for NASAL specimens only), is one component of a comprehensive MRSA colonization surveillance program. It is not intended to diagnose MRSA infection nor to guide or monitor treatment for MRSA infections. Performed at [facility], [place] [address], [place], [place] [address]      Radiology Studies: No results found.  Scheduled Meds: . atropine  0.4 mg Intravenous Once  . enoxaparin (LOVENOX) injection  40 mg Subcutaneous Q24H  . levothyroxine  44 mcg Intravenous Daily  . methylPREDNISolone (SOLU-MEDROL) injection  40 mg Intravenous QPC lunch   Continuous Infusions: . dextrose 75 mL/hr at 04/18/18 1110     LOS: 3 days   Marylu Lund, MD [facility] Pager [phone]  If 7PM-7AM, please contact night-coverage [email or web address] Password [facility] 04/18/2018, 2:37 PM

## 2018-04-18 NOTE — Consult Note (Signed)
            [facility] CM Primary Care Navigator  04/18/2018  Jacqueline Robles Bin 02-14-1922 [id number]   Went to seepatientin the room to identify possible discharge needs but she is resting and sound asleep at this time.  Per chart review, patient was admitted with fever related to UTI sepsis with hypokalemia causing bradycardia and nonsustained ventricular tachycardia (cardiology deemed her not a good candidate for pacemaker placement).   Per palliative care consult note, patient is a long term care resident at skilled nursing facility [facility]). Anticipated discharge plan is skilled nursing facility (SNF) with palliative care, unless she becomes hospice eligible. Family (daughter) had expressed interest for another long term care facility for patient go at discharge.   No further health management needs identified at this point.   For additional questions please contact:  Edwena Felty A. Makenli Derstine, BSN, RN-BC [facility] PRIMARY CARE Navigator Cell: [phone]

## 2018-04-18 NOTE — Evaluation (Signed)
Clinical/Bedside Swallow Evaluation Patient Details  Name: Jacqueline Robles MRN: [id number] Date of Birth: 1921-11-25  Today's Date: 04/18/2018 Time: SLP Start Time (ACUTE ONLY): 1055 SLP Stop Time (ACUTE ONLY): 1114 SLP Time Calculation (min) (ACUTE ONLY): 19 min  Past Medical History:  Past Medical History:  Diagnosis Date  . C. difficile colitis   . Depression   . Hyperlipidemia   . Hypertension   . Osteoporosis   . Thyroid disease   . UTI (lower urinary tract infection)    Past Surgical History:  Past Surgical History:  Procedure Laterality Date  . FOOT SURGERY    . neck surgery     HPI:  84 year old female with severe dementia, aortic stenosis who presents with fever and a urinary tract infection.  CXR without evidence of active pulmonary disease.    Assessment / Plan / Recommendation Clinical Impression  Evaluation complete however limited due to patients cognitive status impacting participation. Patient with little verbal output and only able to follow commands intermittently with max cueing. Refused all attempts at pureed solids. Able to consume thin liquids and self fed soft solids however with intermittent wet vocal quality without observable awareness or ability to clear. Note that patient remains mildly lethargic, closing eyes in between po trials, requiring moderate-max cues for sustained attention to task. Given s/s of aspiration at bedside, unable to safely initiate a po diet today and unlikely, based on today's level of cooperation, that pt will be able to successfully participate in instrumental testing. Hopeful for improved swallowing function as overall medical condition and mentation improve as do not see a h/o dysphagia noted in chart. Plan to f/u in am 8/21. Discussed with RN.  SLP Visit Diagnosis: Dysphagia, unspecified (R13.10)    Aspiration Risk  Moderate aspiration risk    Diet Recommendation NPO   Medication Administration: Via alternative means    Other   Recommendations Oral Care Recommendations: Oral care QID   Follow up Recommendations (TBD)      Frequency and Duration min 2x/week  2 weeks       Prognosis Prognosis for Safe Diet Advancement: Good Barriers to Reach Goals: Cognitive deficits      Swallow Study   General HPI: 84 year old female with severe dementia, aortic stenosis who presents with fever and a urinary tract infection.  CXR without evidence of active pulmonary disease.  Type of Study: Bedside Swallow Evaluation Previous Swallow Assessment: none Diet Prior to this Study: NPO Temperature Spikes Noted: No Respiratory Status: Room air History of Recent Intubation: No Behavior/Cognition: Lethargic/Drowsy;Distractible;Requires cueing;Confused Oral Cavity Assessment: Within Functional Limits Oral Care Completed by SLP: Recent completion by staff Oral Cavity - Dentition: Adequate natural dentition Vision: Functional for self-feeding Self-Feeding Abilities: Able to feed self Patient Positioning: Upright in bed Baseline Vocal Quality: Wet(limited verbal output) Volitional Cough: Cognitively unable to elicit Volitional Swallow: Unable to elicit    Oral/Motor/Sensory Function Overall Oral Motor/Sensory Function: Other (comment)(appears WFL, not assessed formally due to cognition)   Ice Chips Ice chips: Not tested   Thin Liquid Thin Liquid: Impaired Presentation: Cup;Straw;Self Fed Pharyngeal  Phase Impairments: Wet Vocal Quality    Nectar Thick Nectar Thick Liquid: Not tested   Honey Thick Honey Thick Liquid: Not tested   Puree Puree: Not tested(refused, only placed spoon to lips)   Solid     Solid: Impaired Presentation: Self Fed Pharyngeal Phase Impairments: Wet Vocal Quality     Sebastiano Luecke MA, CCC-SLP   Anyia Gierke Meryl 04/18/2018,11:17 AM

## 2018-04-19 DIAGNOSIS — N390 Urinary tract infection, site not specified: Secondary | ICD-10-CM

## 2018-04-19 MED ORDER — FOSFOMYCIN TROMETHAMINE 3 G PO PACK
3.0000 g | PACK | Freq: Once | ORAL | Status: AC
  Administered 2018-04-19: 3 g via ORAL
  Filled 2018-04-19: qty 3

## 2018-04-19 NOTE — Progress Notes (Addendum)
Nutrition Brief Note  Pt identified on the </= 12 braden score report. Palliative Medicine Team note 8/20 reviewed. Goal of care for nutrition is pleasure feeds.  Family does not desire any artificial feeding. No nutrition interventions warranted at this time.  Please consult as needed.   Arthur Holms, RD, LDN Pager #: [phone] After-Hours Pager #: [phone]

## 2018-04-19 NOTE — Discharge Summary (Signed)
Physician Discharge Summary  Jacqueline Robles:[id number] DOB: 1922-03-14 DOA: 04/15/2018  PCP: Hendricks Limes, MD  Admit date: 04/15/2018 Discharge date: 04/19/2018  Admitted From: Skilled nursing facility Disposition: Skilled nursing facility  Recommendations for Outpatient Follow-up:  1. Follow up with PCP in 1-2 weeks 2. Please obtain BMP/CBC in one week   Home Health: None Equipment/Devices: None  Discharge Condition: Stable CODE STATUS: DO NOT RESUSCITATE Diet recommendation: Dysphagia 1 diet with thin liquids, medications crushed with pure  Brief/Interim Summary:  #) Fever due to ESBL UTI: Patient was admitted with fever of unclear etiology.  Her urine grew out ESBL E. coli.  She was initially treated with IV Zosyn and then transition to 1 dose of fosfomycin.  #) Nonsustained V. tach/conduction disease with bradycardia: Patient was noted to have episodes of nonsustained V. tach and conduction disease on EKG.  Cardiology was consulted in the emergency department and recommended aggressive repletion of electrolytes as patient was hypokalemic.  She was initially DNR but then transition to partial code per the family's request.  She will follow-up with hospice as an outpatient at a skilled nursing facility.  She is not a candidate for an ICD or pacemaker per cardiology.  #) Hypothyroidism: Patient was continued on home levothyroxine.  #) TIA/vascular dementia: Patient was noted to be baseline mental status.  She was continued on her home clopidogrel.  She was evaluated by speech-language pathology and transition to a dysphagia 1 diet with thin liquids.  #) Iron deficiency anemia: She was continued on home iron.  #) Pain/psych: Patient was continued on home sertraline.  Discharge Diagnoses:  Principal Problem:   Sepsis ([place]) Active Problems:   Aortic stenosis   Hypothyroidism   Lower urinary tract infectious disease   Vascular dementia   Chronic anemia   Pressure injury  of skin    Discharge Instructions  Discharge Instructions    Call MD for:  difficulty breathing, headache or visual disturbances   Complete by:  As directed    Call MD for:  extreme fatigue   Complete by:  As directed    Call MD for:  hives   Complete by:  As directed    Call MD for:  persistant dizziness or light-headedness   Complete by:  As directed    Call MD for:  persistant nausea and vomiting   Complete by:  As directed    Call MD for:  redness, tenderness, or signs of infection (pain, swelling, redness, odor or green/yellow discharge around incision site)   Complete by:  As directed    Call MD for:  severe uncontrolled pain   Complete by:  As directed    Call MD for:  temperature >100.4   Complete by:  As directed    Diet - low sodium heart healthy   Complete by:  As directed    Discharge instructions   Complete by:  As directed    Please follow up with your PCP in 1 week.   Increase activity slowly   Complete by:  As directed      Allergies as of 04/19/2018      Reactions   Fosamax [alendronate Sodium] Other (See Comments)   Unknown, family is stating there is no allergic/adverse reaction to drug it was just discontinued.   Hctz [hydrochlorothiazide] Other (See Comments)   unknownUnknown, family is stating there is no allergic/adverse reaction to drug it was just discontinued.   Latex    Unknown, family is stating  there is no allergic/adverse reaction to drug it was just discontinued.   Neosporin [neomycin-bacitracin Zn-polymyx] Other (See Comments)   On MAR   Norvasc [amlodipine Besylate]    Unknown, family is stating there is no allergic/adverse reaction to drug it was just discontinued.   Bacitracin-neomycin-polymyxin Rash   Lanolin Rash   Naproxen Rash   Polysporin [bacitracin-polymyxin B] Rash   rash      Medication List    TAKE these medications   acetaminophen 500 MG tablet Commonly known as:  TYLENOL Take 1,000 mg by mouth 2 (two) times daily as  needed for moderate pain.   bisacodyl 10 MG suppository Commonly known as:  DULCOLAX Place 10 mg rectally daily as needed for moderate constipation.   CALCIUM 600-D 600-400 MG-UNIT Tabs Generic drug:  Calcium Carbonate-Vitamin D3 Take 1 each by mouth daily.   cholecalciferol 1000 units tablet Commonly known as:  VITAMIN D Take 1,000 Units daily by mouth.   clopidogrel 75 MG tablet Commonly known as:  PLAVIX Take 75 mg by mouth daily.   ferrous sulfate 325 (65 FE) MG tablet Take 325 mg by mouth daily with breakfast.   fexofenadine 180 MG tablet Commonly known as:  ALLEGRA Take 180 mg daily by mouth.   fluticasone 50 MCG/ACT nasal spray Commonly known as:  FLONASE Place 2 sprays daily into both nostrils.   levothyroxine 88 MCG tablet Commonly known as:  SYNTHROID, LEVOTHROID Take 88 mcg by mouth daily before breakfast.   magnesium hydroxide 400 MG/5ML suspension Commonly known as:  MILK OF MAGNESIA Take 30 mLs by mouth daily as needed for mild constipation.   Melatonin 10 MG Subl Place 1 tablet under the tongue at bedtime as needed (sleep).   nitrofurantoin (macrocrystal-monohydrate) 100 MG capsule Commonly known as:  MACROBID Take 100 mg by mouth 2 (two) times daily.   NUTRITIONAL SUPPLEMENT Liqd Take 120 mLs by mouth 3 (three) times daily. Med[employer]   PRESERVISION AREDS 2 PO Take 1 capsule by mouth 2 (two) times daily.   multivitamin with minerals tablet Take 1 tablet by mouth daily.   ELDERTONIC PO Take 15 mLs by mouth 2 (two) times daily.   RA SALINE ENEMA RE Place 1 Applicatorful rectally daily as needed (constipation).   senna-docusate 8.6-50 MG tablet Commonly known as:  Senokot-S Take 2 tablets by mouth at bedtime.   sertraline 50 MG tablet Commonly known as:  ZOLOFT Take 50 mg by mouth daily.   SYSTANE BALANCE 0.6 % Soln Generic drug:  Propylene Glycol Apply 1 drop to eye 3 (three) times daily as needed (Irritation).      Contact  information for after-discharge care    Destination    [facility] .   Service:  Skilled Nursing Contact information: [address] [address] [phone]             Allergies  Allergen Reactions  . Fosamax [Alendronate Sodium] Other (See Comments)    Unknown, family is stating there is no allergic/adverse reaction to drug it was just discontinued.  [name] Hctz [Hydrochlorothiazide] Other (See Comments)    unknownUnknown, family is stating there is no allergic/adverse reaction to drug it was just discontinued.  . Latex     Unknown, family is stating there is no allergic/adverse reaction to drug it was just discontinued.  . Neosporin [Neomycin-Bacitracin Zn-Polymyx] Other (See Comments)    On MAR  . Norvasc [Amlodipine Besylate]     Unknown, family is  stating there is no allergic/adverse reaction to drug it was just discontinued.  . Bacitracin-Neomycin-Polymyxin Rash  . Lanolin Rash  . Naproxen Rash  . Polysporin [Bacitracin-Polymyxin B] Rash    rash    Consultations:  Palliative Care   Procedures/Studies: Dg Chest Portable 1 View  Result Date: 04/15/2018 CLINICAL DATA:  Fever this evening. EXAM: PORTABLE CHEST 1 VIEW COMPARISON:  12/08/2016 FINDINGS: Examination is technically limited due to patient rotation. As visualized, the heart size and pulmonary vascularity appear normal. Probable emphysematous changes and scattered fibrosis in the lungs. No focal airspace disease or consolidation is identified. No blunting of costophrenic angles. No pneumothorax. Calcification of the aorta. Postoperative changes in the cervical spine. IMPRESSION: Probable emphysematous changes in the lungs. No evidence of active pulmonary disease. Electronically Signed   By: Lucienne Capers M.D.   On: 04/15/2018 01:25       Subjective:   Discharge Exam: Vitals:   04/19/18 0300 04/19/18 0821  BP: (!) 157/79 (!) 172/65  Pulse: (!) 56 (!) 51   Resp: (!) 21 13  Temp: 97.7 F (36.5 C) 97.7 F (36.5 C)  SpO2: 100% 100%   Vitals:   04/18/18 1918 04/18/18 2259 04/19/18 0300 04/19/18 0821  BP: (!) 155/61 (!) 153/89 (!) 157/79 (!) 172/65  Pulse: (!) 55 (!) 53 (!) 56 (!) 51  Resp: 18 20 (!) 21 13  Temp: 97.6 F (36.4 C) 97.6 F (36.4 C) 97.7 F (36.5 C) 97.7 F (36.5 C)  TempSrc: Axillary Axillary Axillary Axillary  SpO2: 100% 100% 100% 100%  Weight:      Height:        General: Pt is alert, awake, not in acute distress, alert but not oriented.  She does occasionally respond to her name. Cardiovascular: Regular rate and rhythm, no murmurs Respiratory: CTA bilaterally, no wheezing, no rhonchi Abdominal: Soft, NT, ND, bowel sounds + Extremities: no edema    The results of significant diagnostics from this hospitalization (including imaging, microbiology, ancillary and laboratory) are listed below for reference.     Microbiology: Recent Results (from the past 240 hour(s))  Urine culture     Status: Abnormal   Collection Time: 04/15/18 12:57 AM  Result Value Ref Range Status   Specimen Description URINE, CATHETERIZED  Final   Special Requests   Final    NONE Performed at [facility], [address], [place], [place] [address]    Culture (A)  Final    >=100,000 COLONIES/mL ESCHERICHIA COLI Confirmed Extended Spectrum Beta-Lactamase Producer (ESBL).  In bloodstream infections from ESBL organisms, carbapenems are preferred over piperacillin/tazobactam. They are shown to have a lower risk of mortality.    Report Status 04/18/2018 FINAL  Final   Organism ID, Bacteria ESCHERICHIA COLI (A)  Final      Susceptibility   Escherichia coli - MIC*    AMPICILLIN >=32 RESISTANT Resistant     CEFAZOLIN >=64 RESISTANT Resistant     CEFTRIAXONE >=64 RESISTANT Resistant     CIPROFLOXACIN >=4 RESISTANT Resistant     GENTAMICIN 4 SENSITIVE Sensitive     IMIPENEM <=0.25 SENSITIVE Sensitive     NITROFURANTOIN 64 INTERMEDIATE  Intermediate     TRIMETH/SULFA >=320 RESISTANT Resistant     AMPICILLIN/SULBACTAM 16 INTERMEDIATE Intermediate     PIP/TAZO <=4 SENSITIVE Sensitive     Extended ESBL POSITIVE Resistant     * >=100,000 COLONIES/mL ESCHERICHIA COLI  Culture, blood (Routine X 2) w Reflex to ID Panel  Status: None (Preliminary result)   Collection Time: 04/15/18  1:06 AM  Result Value Ref Range Status   Specimen Description BLOOD RIGHT FOREARM  Final   Special Requests   Final    BOTTLES DRAWN AEROBIC AND ANAEROBIC Blood Culture adequate volume   Culture   Final    NO GROWTH 3 DAYS Performed at [facility], [address], [place], [place] [address]    Report Status PENDING  Incomplete  Culture, blood (Routine X 2) w Reflex to ID Panel     Status: None (Preliminary result)   Collection Time: 04/15/18  1:10 AM  Result Value Ref Range Status   Specimen Description BLOOD LEFT ANTECUBITAL  Final   Special Requests   Final    BOTTLES DRAWN AEROBIC AND ANAEROBIC Blood Culture adequate volume   Culture   Final    NO GROWTH 3 DAYS Performed at [facility], [place] [address], [place], [place] [address]    Report Status PENDING  Incomplete  MRSA PCR Screening     Status: None   Collection Time: 04/15/18  6:58 AM  Result Value Ref Range Status   MRSA by PCR NEGATIVE NEGATIVE Final    Comment:        The GeneXpert MRSA Assay (FDA approved for NASAL specimens only), is one component of a comprehensive MRSA colonization surveillance program. It is not intended to diagnose MRSA infection nor to guide or monitor treatment for MRSA infections. Performed at [facility], [place] [address], [place], [place] [address]      Labs: BNP (last 3 results) No results for input(s): BNP in the last 8760 hours. Basic Metabolic Panel: Recent Labs  Lab 04/15/18 0123 04/15/18 0231 04/15/18 0702 04/16/18 0859 04/17/18 0242  NA 142  --  139 143 137  K 2.6*  --  3.1* 3.0* 4.1  CL 108  --  110 117*  111  CO2 23  --  21* 19* 17*  GLUCOSE 115*  --  111* 116* 198*  BUN 11  --  12 8 8   CREATININE 0.94  --  0.92 0.84 0.74  CALCIUM 8.2*  --  7.6* 7.3* 7.5*  MG  --  2.0 2.2  --   --    Liver Function Tests: Recent Labs  Lab 04/15/18 0123 04/15/18 0702  AST 26 22  ALT 28 24  ALKPHOS 72 66  BILITOT 0.8 0.7  PROT 5.3* 5.0*  ALBUMIN 2.2* 2.0*   No results for input(s): LIPASE, AMYLASE in the last 168 hours. No results for input(s): AMMONIA in the last 168 hours. CBC: Recent Labs  Lab 04/15/18 0123 04/15/18 0702 04/17/18 0242  WBC 12.2* 13.6* 8.5  NEUTROABS 9.5* 11.0*  --   HGB 9.7* 8.8* 8.8*  HCT 30.9* 27.7* 27.6*  MCV 95.7 95.5 92.9  PLT 225 209 231   Cardiac Enzymes: Recent Labs  Lab 04/15/18 0702 04/15/18 1250 04/15/18 1859  TROPONINI 0.28* 0.09* 0.06*   BNP: Invalid input(s): POCBNP CBG: Recent Labs  Lab 04/15/18 2001 04/16/18 0025 04/16/18 0723 04/16/18 1235 04/16/18 1616  GLUCAP 75 67* 115* 93 83   D-Dimer No results for input(s): DDIMER in the last 72 hours. Hgb A1c No results for input(s): HGBA1C in the last 72 hours. Lipid Profile No results for input(s): CHOL, HDL, LDLCALC, TRIG, CHOLHDL, LDLDIRECT in the last 72 hours. Thyroid function studies No results for input(s): TSH, T4TOTAL, T3FREE, THYROIDAB in the last 72 hours.  Invalid input(s): FREET3 Anemia work up No results for input(s): VITAMINB12, FOLATE, FERRITIN, TIBC, IRON, RETICCTPCT in the last 72 hours. Urinalysis    Component Value Date/Time   COLORURINE AMBER (A) 04/15/2018 0055   APPEARANCEUR TURBID (A) 04/15/2018 0055   LABSPEC 1.016 04/15/2018 0055   PHURINE 6.0 04/15/2018 0055   GLUCOSEU NEGATIVE 04/15/2018 0055   HGBUR SMALL (A) 04/15/2018 0055   BILIRUBINUR NEGATIVE 04/15/2018 0055   KETONESUR NEGATIVE 04/15/2018 0055   PROTEINUR 30 (A) 04/15/2018 0055   UROBILINOGEN 0.2 10/31/2014 1945   NITRITE POSITIVE (A) 04/15/2018 0055   LEUKOCYTESUR LARGE (A) 04/15/2018 0055    Sepsis Labs Invalid input(s): PROCALCITONIN,  WBC,  LACTICIDVEN Microbiology Recent Results (from the past 240 hour(s))  Urine culture     Status: Abnormal   Collection Time: 04/15/18 12:57 AM  Result Value Ref Range Status   Specimen Description URINE, CATHETERIZED  Final   Special Requests   Final    NONE Performed at [facility], [address] [place], [place] [address]    Culture (A)  Final    >=100,000 COLONIES/mL ESCHERICHIA COLI Confirmed Extended Spectrum Beta-Lactamase Producer (ESBL).  In bloodstream infections from ESBL organisms, carbapenems are preferred over piperacillin/tazobactam. They are shown to have a lower risk of mortality.    Report Status 04/18/2018 FINAL  Final   Organism ID, Bacteria ESCHERICHIA COLI (A)  Final      Susceptibility   Escherichia coli - MIC*    AMPICILLIN >=32 RESISTANT Resistant     CEFAZOLIN >=64 RESISTANT Resistant     CEFTRIAXONE >=64 RESISTANT Resistant     CIPROFLOXACIN >=4 RESISTANT Resistant     GENTAMICIN 4 SENSITIVE Sensitive     IMIPENEM <=0.25 SENSITIVE Sensitive     NITROFURANTOIN 64 INTERMEDIATE Intermediate     TRIMETH/SULFA >=320 RESISTANT Resistant     AMPICILLIN/SULBACTAM 16 INTERMEDIATE Intermediate     PIP/TAZO <=4 SENSITIVE Sensitive     Extended ESBL POSITIVE Resistant     * >=100,000 COLONIES/mL ESCHERICHIA COLI  Culture, blood (Routine X 2) w Reflex to ID Panel     Status: None (Preliminary result)   Collection Time: 04/15/18  1:06 AM  Result Value Ref Range Status   Specimen Description BLOOD RIGHT FOREARM  Final   Special Requests   Final    BOTTLES DRAWN AEROBIC AND ANAEROBIC Blood Culture adequate volume   Culture   Final    NO GROWTH 3 DAYS Performed at [facility], [address], [place], [place] [address]    Report Status PENDING  Incomplete  Culture, blood (Routine X 2) w Reflex to ID Panel     Status: None (Preliminary result)   Collection Time: 04/15/18  1:10 AM  Result Value  Ref Range Status   Specimen Description BLOOD LEFT ANTECUBITAL  Final   Special Requests   Final    BOTTLES DRAWN AEROBIC AND ANAEROBIC Blood Culture adequate volume   Culture   Final    NO GROWTH 3 DAYS Performed at [facility], [place] [address], [place], [place] [address]    Report Status PENDING  Incomplete  MRSA PCR Screening     Status: None   Collection Time: 04/15/18  6:58 AM  Result Value Ref Range Status   MRSA by PCR NEGATIVE NEGATIVE Final    Comment:        The GeneXpert MRSA Assay (FDA approved for NASAL specimens only), is one component of a comprehensive MRSA  colonization surveillance program. It is not intended to diagnose MRSA infection nor to guide or monitor treatment for MRSA infections. Performed at [facility], [place] [address] [place], [place] [address]      Time coordinating discharge: 76 mintues  SIGNED:   Cristy Folks, MD  [facility] 04/19/2018, 11:40 AM  If 7PM-7AM, please contact night-coverage [email or web address] Password [email or web address]

## 2018-04-19 NOTE — Discharge Instructions (Signed)

## 2018-04-19 NOTE — Clinical Social Work Note (Signed)
Clinical Social Work Assessment  Patient Details  Name: Jacqueline Robles MRN: [id number] Date of Birth: 1922-02-09  Date of referral:  04/19/18               Reason for consult:  Facility Placement                Permission sought to share information with:  Facility Art therapist granted to share information::  Yes, Verbal Permission Granted  Name::     Jacqueline Robles  Agency::  SNF  Relationship::  son-in-law, dtr  Contact Information:     Housing/Transportation Living arrangements for the past 2 months:  [place] of Information:  Adult Children Patient Interpreter Needed:  None Criminal Activity/Legal Involvement Pertinent to Current Situation/Hospitalization:  No - Comment as needed Significant Relationships:  Adult Children Lives with:  Facility Resident Do you feel safe going back to the place where you live?  Yes Need for family participation in patient care:  No (Coment)  Care giving concerns:  Pt is LTC resident at [facility]- some concerns by family about care at SNF but would not want to move her unless they find a significantly better option- pt has good roommate and family is very involved.   Social Worker assessment / plan:  CSW spoke with pt son in law regarding plan for time of DC.    Employment status:  Retired Forensic scientist:  Medicare PT Recommendations:  Not assessed at this time Information / Referral to community resources:  [place]  Patient/Family's Response to care:  Pt son-in-law is agreeable to return to [place] if there are not more desirable options available.  Patient/Family's Understanding of and Emotional Response to Diagnosis, Current Treatment, and Prognosis:  Family has good understanding of pt condition- hopeful to keep her as well cared for as possible.  Emotional Assessment Appearance:  Appears stated age Attitude/Demeanor/Rapport:  Unable to Assess Affect (typically  observed):  Unable to Assess Orientation:  Oriented to Self Alcohol / Substance use:  Not Applicable Psych involvement (Current and /or in the community):  No (Comment)  Discharge Needs  Concerns to be addressed:  Care Coordination Readmission within the last 30 days:  No Current discharge risk:  Physical Impairment Barriers to Discharge:  Continued Medical Work up   Jorge Ny, LCSW 04/19/2018, 9:36 AM

## 2018-04-19 NOTE — Progress Notes (Signed)
  Speech Language Pathology Treatment: Dysphagia  Patient Details Name: Jacqueline Robles MRN: [id number] DOB: 1922/03/09 Today's Date: 04/19/2018 Time: 0922-0935 SLP Time Calculation (min) (ACUTE ONLY): 13 min  Assessment / Plan / Recommendation Clinical Impression  Improved alertness today, verbalizing "that is good" frequently, followed very simple commands most of the time. Exhibited dementia typical acceptance of food with pursed lips receiving small amount puree from tip of spoon. Cup and straw sips thin (single and consecutive) did not elicit s/s aspiration. Dementia increases her risk however if full supervision/assist provided with proper positioning, recommend Dys 1 (puree) texture upgrading as able and thin liquids, small sips, straws allowed, pills crushed and ST follow up.    HPI HPI: 84 year old female with severe dementia, aortic stenosis who presents with fever and a urinary tract infection.  CXR without evidence of active pulmonary disease.       SLP Plan  Continue with current plan of care       Recommendations  Diet recommendations: Dysphagia 1 (puree);Thin liquid Liquids provided via: Cup;Straw Medication Administration: Crushed with puree Supervision: Staff to assist with self feeding;Full supervision/cueing for compensatory strategies Compensations: Minimize environmental distractions;Slow rate;Small sips/bites;Lingual sweep for clearance of pocketing Postural Changes and/or Swallow Maneuvers: Seated upright 90 degrees                Oral Care Recommendations: Oral care BID Follow up Recommendations: None SLP Visit Diagnosis: Dysphagia, oral phase (R13.11) Plan: Continue with current plan of care       GO                Houston Siren 04/19/2018, 9:39 AM

## 2018-04-19 NOTE — Progress Notes (Signed)
Pt seen by MD, orders written for discharge. RN went over discharge instructions with pt and answered all questions. RN removed IV's. Escorted for discharge via [facility] and transferred back to [place]. RN attempted to call report about pt's hospital stay. Pt will follow up outpatient with MD.

## 2018-04-19 NOTE — [place] FL2 (Signed)
[place] MEDICAID FL2 LEVEL OF CARE SCREENING TOOL     IDENTIFICATION  Patient Name: Jacqueline Robles Birthdate: 05-27-1922 Sex: female Admission Date (Current Location): 04/15/2018  [facility] and [place] Number:  Herbalist and Address:  [facility], [place] [address], [place], [place] [address]      Provider Number: [id number]  Attending Physician Name and Address:  Cristy Folks, MD  Relative Name and Phone Number:       Current Level of Care: Hospital Recommended Level of Care: [place] Prior Approval Number:    Date Approved/Denied:   PASRR Number: [id number]  Discharge Plan: SNF    Current Diagnoses: Patient Active Problem List   Diagnosis Date Noted  . Sepsis ([place]) 04/15/2018  . Pressure injury of skin 04/15/2018  . Hypokalemia   . Nonsustained ventricular tachycardia ([place])   . Abnormal chest x-ray 04/13/2018  . Chronic anemia 03/30/2018  . Depression, major, single episode, complete remission ([place]) 12/25/2017  . History of itching of eye 10/13/2017  . History of SIADH 10/13/2017  . Vascular dementia 05/27/2017  . Adult failure to thrive 05/26/2017  . Recurrent falls 05/26/2017  . Encounter for family conference with patient present 12/04/2014  . Candidal diaper dermatitis 12/04/2014  . Agitation 11/10/2014  . C. difficile colitis 11/04/2014  . Aortic stenosis 11/01/2014  . Hypothyroidism 11/01/2014  . Hyperlipidemia 11/01/2014  . Lower urinary tract infectious disease 11/01/2014  . Sacral insufficiency fracture 11/01/2014  . Sacral fracture, closed ([place]) 10/31/2014    Orientation RESPIRATION BLADDER Height & Weight     Self  Normal Incontinent, External catheter Weight: 110 lb (49.9 kg) Height:  5\' 2"  (157.5 cm)  BEHAVIORAL SYMPTOMS/MOOD NEUROLOGICAL BOWEL NUTRITION STATUS      Incontinent Diet(see DC summary)  AMBULATORY STATUS COMMUNICATION OF NEEDS Skin   Extensive Assist Verbally PU Stage and  Appropriate Care PU Stage 1 Dressing: (on sacrum- foam dressing change)                     Personal Care Assistance Level of Assistance  Bathing, Dressing Bathing Assistance: Maximum assistance   Dressing Assistance: Maximum assistance     Functional Limitations Info             SPECIAL CARE FACTORS FREQUENCY  PT (By licensed PT), OT (By licensed OT), Speech therapy             Speech Therapy Frequency: 3/wk      Contractures      Additional Factors Info  Code Status, Allergies Code Status Info: DNR Allergies Info: Fosamax Alendronate Sodium, Hctz Hydrochlorothiazide, Latex, Neosporin Neomycin-bacitracin Zn-polymyx, Norvasc Amlodipine Besylate, Bacitracin-neomycin-polymyxin, Lanolin, Naproxen, Polysporin Bacitracin-polymyxin B           Current Medications (04/19/2018):  This is the current hospital active medication list Current Facility-Administered Medications  Medication Dose Route Frequency Provider Last Rate Last Dose  . acetaminophen (TYLENOL) tablet 650 mg  650 mg Oral Q6H PRN Rise Patience, MD       Or  . acetaminophen (TYLENOL) suppository 650 mg  650 mg Rectal Q6H PRN Rise Patience, MD      . atropine injection 0.4 mg  0.4 mg Intravenous Once Pollina, Gwenyth Allegra, MD      . dextrose 5 % solution   Intravenous Continuous Donne Hazel, MD 75 mL/hr at 04/19/18 0025    . enoxaparin (LOVENOX) injection 40 mg  40 mg Subcutaneous Q24H  Donne Hazel, MD   40 mg at 04/18/18 [phone]  . ipratropium-albuterol (DUONEB) 0.5-2.5 (3) MG/3ML nebulizer solution 3 mL  3 mL Nebulization Q2H PRN Donne Hazel, MD      . levothyroxine (SYNTHROID, LEVOTHROID) injection 44 mcg  44 mcg Intravenous Daily Rise Patience, MD   44 mcg at 04/18/18 1108  . methylPREDNISolone sodium succinate (SOLU-MEDROL) 40 mg/mL injection 40 mg  40 mg Intravenous QPC lunch Donne Hazel, MD   40 mg at 04/18/18 1457  . ondansetron (ZOFRAN) tablet 4 mg  4 mg Oral Q6H PRN  Rise Patience, MD       Or  . ondansetron [facility]) injection 4 mg  4 mg Intravenous Q6H PRN Rise Patience, MD         Discharge Medications: Please see discharge summary for a list of discharge medications.  Relevant Imaging Results:  Relevant Lab Results:   Additional Information SS#: [id number]  Jorge Ny, LCSW

## 2018-04-19 NOTE — Care Management Important Message (Signed)
Important Message  Patient Details  Name: Jacqueline Robles MRN: [id number] Date of Birth: Jan 25, 1922   Medicare Important Message Given:  Yes    Zenon Mayo, RN 04/19/2018, 2:49 PM

## 2018-04-19 NOTE — Progress Notes (Signed)
Patient will discharge to [facility] Anticipated discharge date: 8/21 Family notified: pt dtr Transportation by [facility]- scheduled for 3pm  CSW signing off.  Jorge Ny, LCSW Clinical Social Worker [phone]

## 2018-04-20 ENCOUNTER — Non-Acute Institutional Stay (SKILLED_NURSING_FACILITY): Payer: Medicare Other | Admitting: Internal Medicine

## 2018-04-20 ENCOUNTER — Encounter: Payer: Self-pay | Admitting: Internal Medicine

## 2018-04-20 DIAGNOSIS — M6281 Muscle weakness (generalized): Secondary | ICD-10-CM | POA: Diagnosis not present

## 2018-04-20 DIAGNOSIS — E034 Atrophy of thyroid (acquired): Secondary | ICD-10-CM

## 2018-04-20 DIAGNOSIS — R627 Adult failure to thrive: Secondary | ICD-10-CM

## 2018-04-20 DIAGNOSIS — I4729 Other ventricular tachycardia: Secondary | ICD-10-CM

## 2018-04-20 DIAGNOSIS — E876 Hypokalemia: Secondary | ICD-10-CM

## 2018-04-20 DIAGNOSIS — E8809 Other disorders of plasma-protein metabolism, not elsewhere classified: Secondary | ICD-10-CM | POA: Diagnosis not present

## 2018-04-20 DIAGNOSIS — A419 Sepsis, unspecified organism: Secondary | ICD-10-CM

## 2018-04-20 DIAGNOSIS — I472 Ventricular tachycardia: Secondary | ICD-10-CM | POA: Diagnosis not present

## 2018-04-20 DIAGNOSIS — R1311 Dysphagia, oral phase: Secondary | ICD-10-CM | POA: Diagnosis not present

## 2018-04-20 DIAGNOSIS — A4151 Sepsis due to Escherichia coli [E. coli]: Secondary | ICD-10-CM | POA: Diagnosis not present

## 2018-04-20 LAB — CULTURE, BLOOD (ROUTINE X 2)
CULTURE: NO GROWTH
Culture: NO GROWTH
Special Requests: ADEQUATE
Special Requests: ADEQUATE

## 2018-04-20 NOTE — Assessment & Plan Note (Signed)
Hospice involvement appropriate

## 2018-04-20 NOTE — Assessment & Plan Note (Addendum)
03/21/18 TSH 5.51 04/15/2018 TSH 8.63 Review of AHT reveals that administration of L-thyroxine was erratic or there was poor documentation of administration.This will be reviewed with DON TSH goal should be 5-10 Avoid low TSH because of the history of nonsustained V. tach

## 2018-04-20 NOTE — Progress Notes (Signed)
    NURSING HOME LOCATION:  [facility] ROOM NUMBER:  118-B  CODE STATUS:  DNR  PCP:  Hendricks Limes, MD  [place] [address]  This is a [place] readmission within 30 days.  Interim medical record and care since last [place] visit was updated with review of diagnostic studies and change in clinical status since last visit were documented.  HPI: The patient was hospitalized 8/17-8/21/2019 with fever due to ESBL UTI with E. coli isolate.  Initially treatment was IV Zosyn with transition to 1 dose of fosfomycin Course was complicated by nonsustained V. tach/conduction disease with bradycardia.Cardiology saw the patient in the ED and recommended aggressive repletion of electrolytes for hypokalemia.  Initially patient was DNR but then transition to partial code as per the patient's daughter's request. Follow up with Hospice as an outpatient at the SNF was recommended.  Cardiology did not feel she was a candidate for ICD or pacemaker. Vascular dementia was felt to be at baseline. Speech-language pathology consulted and transitioned her to a dysphagia 1 diet with thin liquids. Potassium was corrected from a low of 2.6- to 4.1. Glucose was 198, calcium 7.5, total protein 5.3, and albumin 2.2.  Hemoglobin 8.8 and hematocrit 27.6 with normochromic, normocytic indices were present.  Current A1c was prediabetic at 5.8%.  Review of systems: The patient can provide no history. PT/OT reports that she previously was on restorative feeding program and was using her arms.  At this time she exhibits no spontaneous activity according to them & speech pattern is unintelligible.  They have recommended mechanical lift as they do not feel she can be safely moved.  They do not believe she will be able to tolerate sitting up.  Hospice actively involved.  Physical exam:  Pertinent or positive findings: Will open her eyes but does not focus.  There is a small area of eschar  formation over the left medial orbit.  Her mouth sags to the right.  Lips are dry.  She moans unintelligibly.  There is a musical grade 1.5 systolic murmur at the base.  Breath sounds are decreased anteriorly.  There is marked decreased tone to all 4 limbs.  When her limbs are lifted they drop to the bed.  1+ pedal edema is present.  Pedal pulses are decreased.  General appearance: no acute distress, increased work of breathing is present.   Lymphatic: No lymphadenopathy about the head, neck, axilla. Eyes: No conjunctival inflammation or lid edema is present. There is no scleral icterus. Ears:  External ear exam shows no significant lesions or deformities.   Nose:  External nasal examination shows no deformity or inflammation. Nasal mucosa are pink and moist without lesions, exudates Neck:  No thyromegaly, masses, tenderness noted.    Heart:  No gallop, click, rub .  Lungs:  without wheezes, rhonchi, rales, rubs. Abdomen: Bowel sounds are normal. Abdomen is soft and nontender with no organomegaly, hernias, masses. GU: Deferred  Extremities:  No cyanosis, clubbing  Skin: Warm & dry . No significant lesions or rash.  See summary under each active problem in the Problem List with associated updated therapeutic plan

## 2018-04-20 NOTE — Assessment & Plan Note (Signed)
Thought to be a factor in the cardiac dysrhythmias nonsustained V. Tach Repleted in the hospital

## 2018-04-20 NOTE — Patient Instructions (Signed)
See assessment and plan under each diagnosis in the problem list and acutely for this visit 

## 2018-04-20 NOTE — Assessment & Plan Note (Signed)
Monitor for recurrence

## 2018-04-20 NOTE — Assessment & Plan Note (Signed)
Albumin 2.2 and total protein 5.3. Nutrition consult

## 2018-04-20 NOTE — Assessment & Plan Note (Signed)
Resolved with correction of hypokalemia

## 2018-04-22 ENCOUNTER — Emergency Department (HOSPITAL_COMMUNITY): Payer: Medicare Other

## 2018-04-22 ENCOUNTER — Inpatient Hospital Stay (HOSPITAL_COMMUNITY)
Admission: EM | Admit: 2018-04-22 | Discharge: 2018-04-24 | DRG: 071 | Disposition: A | Discharge status: Hospice | Payer: Medicare Other | Source: Skilled Nursing Facility | Attending: Internal Medicine | Admitting: Internal Medicine

## 2018-04-22 ENCOUNTER — Encounter (HOSPITAL_COMMUNITY): Payer: Self-pay | Admitting: *Deleted

## 2018-04-22 ENCOUNTER — Other Ambulatory Visit: Payer: Self-pay

## 2018-04-22 DIAGNOSIS — R627 Adult failure to thrive: Secondary | ICD-10-CM | POA: Diagnosis present

## 2018-04-22 DIAGNOSIS — R4689 Other symptoms and signs involving appearance and behavior: Secondary | ICD-10-CM | POA: Diagnosis not present

## 2018-04-22 DIAGNOSIS — D509 Iron deficiency anemia, unspecified: Secondary | ICD-10-CM | POA: Diagnosis present

## 2018-04-22 DIAGNOSIS — I672 Cerebral atherosclerosis: Secondary | ICD-10-CM | POA: Diagnosis not present

## 2018-04-22 DIAGNOSIS — M810 Age-related osteoporosis without current pathological fracture: Secondary | ICD-10-CM | POA: Diagnosis present

## 2018-04-22 DIAGNOSIS — Z881 Allergy status to other antibiotic agents status: Secondary | ICD-10-CM

## 2018-04-22 DIAGNOSIS — G8929 Other chronic pain: Secondary | ICD-10-CM | POA: Diagnosis present

## 2018-04-22 DIAGNOSIS — Z888 Allergy status to other drugs, medicaments and biological substances status: Secondary | ICD-10-CM

## 2018-04-22 DIAGNOSIS — F0150 Vascular dementia without behavioral disturbance: Secondary | ICD-10-CM | POA: Diagnosis present

## 2018-04-22 DIAGNOSIS — E876 Hypokalemia: Secondary | ICD-10-CM | POA: Diagnosis present

## 2018-04-22 DIAGNOSIS — G9341 Metabolic encephalopathy: Principal | ICD-10-CM | POA: Diagnosis present

## 2018-04-22 DIAGNOSIS — E860 Dehydration: Secondary | ICD-10-CM | POA: Diagnosis present

## 2018-04-22 DIAGNOSIS — E785 Hyperlipidemia, unspecified: Secondary | ICD-10-CM | POA: Diagnosis present

## 2018-04-22 DIAGNOSIS — Z66 Do not resuscitate: Secondary | ICD-10-CM | POA: Diagnosis present

## 2018-04-22 DIAGNOSIS — R509 Fever, unspecified: Secondary | ICD-10-CM | POA: Diagnosis not present

## 2018-04-22 DIAGNOSIS — R404 Transient alteration of awareness: Secondary | ICD-10-CM | POA: Diagnosis not present

## 2018-04-22 DIAGNOSIS — I69919 Unspecified symptoms and signs involving cognitive functions following unspecified cerebrovascular disease: Secondary | ICD-10-CM | POA: Diagnosis present

## 2018-04-22 DIAGNOSIS — I10 Essential (primary) hypertension: Secondary | ICD-10-CM | POA: Diagnosis present

## 2018-04-22 DIAGNOSIS — Z8249 Family history of ischemic heart disease and other diseases of the circulatory system: Secondary | ICD-10-CM | POA: Diagnosis not present

## 2018-04-22 DIAGNOSIS — Z87440 Personal history of urinary (tract) infections: Secondary | ICD-10-CM

## 2018-04-22 DIAGNOSIS — Z6820 Body mass index (BMI) 20.0-20.9, adult: Secondary | ICD-10-CM | POA: Diagnosis not present

## 2018-04-22 DIAGNOSIS — I350 Nonrheumatic aortic (valve) stenosis: Secondary | ICD-10-CM | POA: Diagnosis present

## 2018-04-22 DIAGNOSIS — I472 Ventricular tachycardia: Secondary | ICD-10-CM | POA: Diagnosis present

## 2018-04-22 DIAGNOSIS — Z8619 Personal history of other infectious and parasitic diseases: Secondary | ICD-10-CM | POA: Diagnosis not present

## 2018-04-22 DIAGNOSIS — Z7401 Bed confinement status: Secondary | ICD-10-CM | POA: Diagnosis not present

## 2018-04-22 DIAGNOSIS — F329 Major depressive disorder, single episode, unspecified: Secondary | ICD-10-CM | POA: Diagnosis present

## 2018-04-22 DIAGNOSIS — E039 Hypothyroidism, unspecified: Secondary | ICD-10-CM | POA: Diagnosis present

## 2018-04-22 DIAGNOSIS — Z886 Allergy status to analgesic agent status: Secondary | ICD-10-CM

## 2018-04-22 DIAGNOSIS — Z79890 Hormone replacement therapy (postmenopausal): Secondary | ICD-10-CM | POA: Diagnosis not present

## 2018-04-22 DIAGNOSIS — Z515 Encounter for palliative care: Secondary | ICD-10-CM | POA: Diagnosis present

## 2018-04-22 DIAGNOSIS — Z7902 Long term (current) use of antithrombotics/antiplatelets: Secondary | ICD-10-CM

## 2018-04-22 DIAGNOSIS — M2550 Pain in unspecified joint: Secondary | ICD-10-CM | POA: Diagnosis not present

## 2018-04-22 DIAGNOSIS — R4182 Altered mental status, unspecified: Secondary | ICD-10-CM | POA: Diagnosis not present

## 2018-04-22 DIAGNOSIS — R0902 Hypoxemia: Secondary | ICD-10-CM | POA: Diagnosis not present

## 2018-04-22 DIAGNOSIS — Z91040 Latex allergy status: Secondary | ICD-10-CM

## 2018-04-22 DIAGNOSIS — R001 Bradycardia, unspecified: Secondary | ICD-10-CM | POA: Diagnosis present

## 2018-04-22 DIAGNOSIS — Z79899 Other long term (current) drug therapy: Secondary | ICD-10-CM

## 2018-04-22 LAB — COMPREHENSIVE METABOLIC PANEL
ALT: 13 U/L (ref 0–44)
ANION GAP: 8 (ref 5–15)
AST: 18 U/L (ref 15–41)
Albumin: 1.9 g/dL — ABNORMAL LOW (ref 3.5–5.0)
Alkaline Phosphatase: 57 U/L (ref 38–126)
BILIRUBIN TOTAL: 0.8 mg/dL (ref 0.3–1.2)
BUN: 9 mg/dL (ref 8–23)
CHLORIDE: 110 mmol/L (ref 98–111)
CO2: 19 mmol/L — ABNORMAL LOW (ref 22–32)
Calcium: 7.7 mg/dL — ABNORMAL LOW (ref 8.9–10.3)
Creatinine, Ser: 0.72 mg/dL (ref 0.44–1.00)
Glucose, Bld: 84 mg/dL (ref 70–99)
Potassium: 2.6 mmol/L — CL (ref 3.5–5.1)
Sodium: 137 mmol/L (ref 135–145)
TOTAL PROTEIN: 4.3 g/dL — AB (ref 6.5–8.1)

## 2018-04-22 LAB — CBC
HEMATOCRIT: 32.2 % — AB (ref 36.0–46.0)
Hemoglobin: 10.3 g/dL — ABNORMAL LOW (ref 12.0–15.0)
MCH: 29.9 pg (ref 26.0–34.0)
MCHC: 32 g/dL (ref 30.0–36.0)
MCV: 93.3 fL (ref 78.0–100.0)
PLATELETS: 235 10*3/uL (ref 150–400)
RBC: 3.45 MIL/uL — ABNORMAL LOW (ref 3.87–5.11)
RDW: 15.8 % — AB (ref 11.5–15.5)
WBC: 15.4 10*3/uL — ABNORMAL HIGH (ref 4.0–10.5)

## 2018-04-22 LAB — I-STAT CG4 LACTIC ACID, ED: LACTIC ACID, VENOUS: 0.88 mmol/L (ref 0.5–1.9)

## 2018-04-22 LAB — I-STAT TROPONIN, ED: TROPONIN I, POC: 0.05 ng/mL (ref 0.00–0.08)

## 2018-04-22 MED ORDER — HALOPERIDOL LACTATE 2 MG/ML PO CONC
0.5000 mg | ORAL | Status: DC | PRN
  Filled 2018-04-22: qty 0.3

## 2018-04-22 MED ORDER — GLYCOPYRROLATE 1 MG PO TABS
1.0000 mg | ORAL_TABLET | ORAL | Status: DC | PRN
  Filled 2018-04-22: qty 1

## 2018-04-22 MED ORDER — LORAZEPAM 0.5 MG PO TABS: 0.5000 mg | ORAL_TABLET | Freq: Four times a day (QID) | ORAL | Status: DC | PRN

## 2018-04-22 MED ORDER — SODIUM CHLORIDE 0.9 % IV SOLN
12.5000 mg | Freq: Four times a day (QID) | INTRAVENOUS | Status: DC | PRN
  Filled 2018-04-22: qty 0.5

## 2018-04-22 MED ORDER — ONDANSETRON HCL 4 MG/2ML IJ SOLN: 4.0000 mg | Freq: Four times a day (QID) | INTRAMUSCULAR | Status: DC | PRN

## 2018-04-22 MED ORDER — LORAZEPAM 2 MG/ML PO CONC: 0.5000 mg | Freq: Four times a day (QID) | ORAL | Status: DC | PRN

## 2018-04-22 MED ORDER — GLYCOPYRROLATE 0.2 MG/ML IJ SOLN: 0.2000 mg | INTRAMUSCULAR | Status: DC | PRN

## 2018-04-22 MED ORDER — ONDANSETRON 4 MG PO TBDP: 4.0000 mg | ORAL_TABLET | Freq: Four times a day (QID) | ORAL | Status: DC | PRN

## 2018-04-22 MED ORDER — HALOPERIDOL LACTATE 5 MG/ML IJ SOLN: 0.5000 mg | INTRAMUSCULAR | Status: DC | PRN

## 2018-04-22 MED ORDER — ACETAMINOPHEN 650 MG RE SUPP: 650.0000 mg | Freq: Four times a day (QID) | RECTAL | Status: DC | PRN

## 2018-04-22 MED ORDER — MORPHINE SULFATE (CONCENTRATE) 10 MG/0.5ML PO SOLN: 5.0000 mg | ORAL | Status: DC | PRN

## 2018-04-22 MED ORDER — HYDROMORPHONE HCL 1 MG/ML IJ SOLN: 0.5000 mg | INTRAMUSCULAR | Status: DC | PRN

## 2018-04-22 MED ORDER — POLYVINYL ALCOHOL 1.4 % OP SOLN
1.0000 [drp] | Freq: Four times a day (QID) | OPHTHALMIC | Status: DC | PRN
  Filled 2018-04-22: qty 15

## 2018-04-22 MED ORDER — LORAZEPAM 2 MG/ML IJ SOLN: 0.5000 mg | Freq: Four times a day (QID) | INTRAMUSCULAR | Status: DC | PRN

## 2018-04-22 MED ORDER — BIOTENE DRY MOUTH MT LIQD: 15.0000 mL | OROMUCOSAL | Status: DC | PRN

## 2018-04-22 MED ORDER — MORPHINE SULFATE (CONCENTRATE) 10 MG/0.5ML PO SOLN
5.0000 mg | ORAL | Status: DC | PRN
  Filled 2018-04-22: qty 0.5

## 2018-04-22 MED ORDER — DIPHENHYDRAMINE HCL 50 MG/ML IJ SOLN: 12.5000 mg | INTRAMUSCULAR | Status: DC | PRN

## 2018-04-22 MED ORDER — HALOPERIDOL 0.5 MG PO TABS
0.5000 mg | ORAL_TABLET | ORAL | Status: DC | PRN
  Filled 2018-04-22: qty 1

## 2018-04-22 MED ORDER — ACETAMINOPHEN 325 MG PO TABS: 650.0000 mg | ORAL_TABLET | Freq: Four times a day (QID) | ORAL | Status: DC | PRN

## 2018-04-22 NOTE — Progress Notes (Signed)
CSW spoke with Jacqueline Robles from [place] and family at bedside. CSW made aware that pt is unstable for transfer at this time. Per MD pt will be admitted for further monitoring at this time. Hospice has been contacted to evaluate further hospice care needs at this time.   Virgie Dad Donyel Castagnola, MSW, [facility] Clinical Social Worker [phone]

## 2018-04-22 NOTE — Progress Notes (Signed)
[facility] [place] Referral  Received request from [place] for family interest in [facility]. Chart reviewed and spoke with Jacqueline Robles (daughter) to acknowledge referral.  Unfortunately Jacqueline Robles is not able to offer a room today. Jacqueline Robles  and CSW are aware of no availability.   [facility] liaison will follow up with CSW and family tomorrow or sooner if room becomes available. Please do not hesitate to call with questions.   Thank you, Venia Carbon BSN, RN  [facility] Liaison  [place] are listed on AMION

## 2018-04-22 NOTE — Clinical Social Work Note (Signed)
Clinical Social Work Assessment  Patient Details  Name: Jacqueline Robles MRN: [id number] Date of Birth: [birth year]  Date of referral:  04/22/18               Reason for consult:  Facility Placement(Residential Hospice. )                Permission sought to share information with:  Family Supports Permission granted to share information::  Yes, Verbal Permission Granted  Name::     Georgann Housekeeper   Agency::  family   Relationship::  daughter  Contact Information:  Georgann Housekeeper [phone]  Housing/Transportation Living arrangements for the past 2 months:  Skilled Nursing Facility([facility]) Source of Information:  Adult Children Patient Interpreter Needed:  None Criminal Activity/Legal Involvement Pertinent to Current Situation/Hospitalization:  No - Comment as needed Significant Relationships:  Other Family Members, Adult Children Lives with:  Facility Resident Do you feel safe going back to the place where you live?  Yes Need for family participation in patient care:  Yes (Comment)  Care giving concerns:  CSW consulted for possible residential hospice.    Social Worker assessment / plan:  CSW spoke with pt and daughter Opal Sidles at bedside. Pt was asleep therefore most information obtained came from daughter. CSW was informed that pt has been at [facility] and then discharged. Pt's daughter expressed that pt has been slowly going down and daughter and other family have been considering hospice care for pt. CSW spoke with Amy from [facility] and was informed that she would follow up with pt and daughter around 2:30pm for further hospice needs.   Employment status:  Retired Forensic scientist:  Medicare PT Recommendations:  Not assessed at this time [place] / Referral to community resources:  Other (Comment Required)(Residential Hospice. )  Patient/Family's Response to care:  Pt's daughter appeared to be understanding and agreeable to plan of care at this time.   Patient/Family's  Understanding of and Emotional Response to Diagnosis, Current Treatment, and Prognosis:  No further questions or concerns have been presented to CSW at this time.   Emotional Assessment Appearance:  Appears older than stated age Attitude/Demeanor/Rapport:  Unable to Assess Affect (typically observed):  Unable to Assess Orientation:  (pt asleep) Alcohol / Substance use:  Not Applicable Psych involvement (Current and /or in the community):  No (Comment)  Discharge Needs  Concerns to be addressed:  Discharge Planning Concerns Readmission within the last 30 days:  No Current discharge risk:  Dependent with Mobility, Chronically ill Barriers to Discharge:  Hospice Bed not available   Wetzel Bjornstad, [employer] 04/22/2018, 1:39 PM

## 2018-04-22 NOTE — Progress Notes (Signed)
CSW consulted as pt's family may be interested in [facility]. CSW spoke with pt's daughter Opal Sidles at bedside and was informed that they are considering hospice placement. CSW was asked bu Opal Sidles to come back to room once daughters husband is int he ED. CSW spoke with Amy from [facility] and was informed that she would be to speak with pt and daughter around 2:30pm for further information needed. CSW has updated MD at this time.    Virgie Dad Kaedence Connelly, MSW, [place] [facility] Clinical Social Worker [phone]

## 2018-04-22 NOTE — H&P (Signed)
History and Physical    Jacqueline Robles HYW:[id number] DOB: [birth year] DOA: 04/22/2018   PCP: Hendricks Limes, MD   Patient coming from: Nursing home   Chief Complaint: End-of-life issues  HPI: Jacqueline Robles is a 84 y.o. female with medical history significant for hypothyroidism, history of TIA and vascular dementia, iron deficiency anemia, chronic pain issues, hypertension, hyperlipidemia, and recent admission for fever due to ESBL UTI, brought from the nursing home, with acute altered mental status.  History is obtained by her family, as the patient is unable to provide information, due to level 5 caveat the family reports that since her last discharge, her overall status has significantly declined, and yesterday, she began to moan, became increasingly lethargic, and had no oral intake.  ER physician tried to contact care management, to transition to become place from the ER, but during the weekend, that is not possible.  Family feels that the patient is reaching her end of life, and they are requesting that the patient be admitted in order to transition to become place understanding that no heroic measures are to be given during this hospitalization, no labs will be drawn, and that she may die in hospital.  They are in agreement.   ED Course:  BP (!) 190/70   Pulse (!) 30   Temp 98.3 F (36.8 C) (Oral)   Resp 15   Ht 5\' 2"  (1.575 m)   Wt 49.9 kg   SpO2 97%   BMI 20.12 kg/m   No labs were drawn.  Review of Systems:  As per HPI otherwise all other systems reviewed and are negative  Past Medical History:  Diagnosis Date  . C. difficile colitis   . Depression   . Hyperlipidemia   . Hypertension   . Osteoporosis   . Thyroid disease   . UTI (lower urinary tract infection)     Past Surgical History:  Procedure Laterality Date  . FOOT SURGERY    . neck surgery      Social History Social History   Socioeconomic History  . Marital status: Widowed    Spouse name: Not on file   . Number of children: Not on file  . Years of education: Not on file  . Highest education level: Not on file  Occupational History  . Not on file  Social Needs  . Financial resource strain: Not hard at all  . Food insecurity:    Worry: Never true    Inability: Never true  . Transportation needs:    Medical: No    Non-medical: No  Tobacco Use  . Smoking status: Never Smoker  . Smokeless tobacco: Never Used  Substance and Sexual Activity  . Alcohol use: No  . Drug use: No  . Sexual activity: Not on file  Lifestyle  . Physical activity:    Days per week: 0 days    Minutes per session: 0 min  . Stress: Not at all  Relationships  . Social connections:    Talks on phone: Twice a week    Gets together: Twice a week    Attends religious service: Never    Active member of club or organization: No    Attends meetings of clubs or organizations: Never    Relationship status: Widowed  . Intimate partner violence:    Fear of current or ex partner: No    Emotionally abused: No    Physically abused: No    Forced sexual activity: No  Other  Topics Concern  . Not on file  Social History Narrative   Family is very involved in care.     Allergies  Allergen Reactions  . Fosamax [Alendronate Sodium] Other (See Comments)    Unknown, family is stating there is no allergic/adverse reaction to drug it was just discontinued.  [name] Hctz [Hydrochlorothiazide] Other (See Comments)    unknownUnknown, family is stating there is no allergic/adverse reaction to drug it was just discontinued.  . Latex     Unknown, family is stating there is no allergic/adverse reaction to drug it was just discontinued.  . Neosporin [Neomycin-Bacitracin Zn-Polymyx] Other (See Comments)    On MAR  . Norvasc [Amlodipine Besylate]     Unknown, family is stating there is no allergic/adverse reaction to drug it was just discontinued.  . Bacitracin-Neomycin-Polymyxin Rash  . Lanolin Rash  . Naproxen Rash  .  Polysporin [Bacitracin-Polymyxin B] Rash    rash    Family History  Problem Relation Age of Onset  . Hypertension Son        Prior to Admission medications   Medication Sig Start Date End Date Taking? Authorizing Provider  Calcium Carbonate-Vitamin D3 (CALCIUM 600-D) 600-400 MG-UNIT TABS Take 1 each by mouth daily.   Yes [provider]  cholecalciferol (VITAMIN D) 1000 units tablet Take 1,000 Units daily by mouth.    Yes [provider]  clopidogrel (PLAVIX) 75 MG tablet Take 75 mg by mouth daily.   Yes [provider]  ferrous sulfate 325 (65 FE) MG tablet Take 325 mg by mouth daily with breakfast.    Yes [provider]  fexofenadine (ALLEGRA) 180 MG tablet Take 180 mg daily by mouth.    Yes [provider]  fluticasone (FLONASE) 50 MCG/ACT nasal spray Place 2 sprays daily into both nostrils.    Yes [provider]  levothyroxine (SYNTHROID, LEVOTHROID) 88 MCG tablet Take 88 mcg by mouth daily before breakfast.   Yes [provider]  Multiple Vitamins-Minerals (ELDERTONIC PO) Take 15 mLs by mouth 2 (two) times daily.    Yes [provider]  Multiple Vitamins-Minerals (MULTIVITAMIN WITH MINERALS) tablet Take 1 tablet by mouth daily.   Yes [provider]  Multiple Vitamins-Minerals (PRESERVISION AREDS 2 PO) Take 1 capsule by mouth 2 (two) times daily.    Yes [provider]  nitrofurantoin, macrocrystal-monohydrate, (MACROBID) 100 MG capsule Take 100 mg by mouth 2 (two) times daily. 04/19/18 04/24/18 Yes [provider]  NUTRITIONAL SUPPLEMENT LIQD Take 120 mLs by mouth 3 (three) times daily. [employer]    Yes [provider]  senna-docusate (SENOKOT-S) 8.6-50 MG tablet Take 2 tablets by mouth at bedtime.   Yes [provider]  sertraline (ZOLOFT) 50 MG tablet Take 50 mg by mouth daily.    Yes [provider]  acetaminophen (TYLENOL) 500 MG tablet Take 1,000 mg by mouth  2 (two) times daily as needed for moderate pain.     [provider]  bisacodyl (DULCOLAX) 10 MG suppository Place 10 mg rectally daily as needed for moderate constipation.    [provider]  magnesium hydroxide (MILK OF MAGNESIA) 400 MG/5ML suspension Take 30 mLs by mouth daily as needed for mild constipation.    [provider]  Melatonin 10 MG SUBL Place 1 tablet under the tongue at bedtime as needed (sleep).    [provider]  Propylene Glycol (SYSTANE BALANCE) 0.6 % SOLN Apply 1 drop to eye 3 (three) times daily as  needed (Irritation).    [provider]  Sodium Phosphates (RA SALINE ENEMA RE) Place 1 Applicatorful rectally daily as needed (constipation).    [provider]     Physical Exam:  Vitals:   04/22/18 1100 04/22/18 1130 04/22/18 1200 04/22/18 1445  BP: (!) 171/50 (!) 160/60 (!) 190/70   Pulse: 98 (!) 48 (!) 34 (!) 30  Resp: 13 16 18 15   Temp:      TempSrc:      SpO2: 98% 98% 98% 97%  Weight:      Height:       Constitutional: Patient has intermittent periods of moaning, accompanied by other periods of very calm behavior.   Eyes: PERRL, lids and conjunctivae normal ENMT: Mucous membranes are dry, without exudate or lesions  Neck: normal, supple, no masses, no thyromegaly Respiratory: Bilateral rhonchi and Rales, no wheezing.  Normal respiratory effort  Cardiovascular: Bradycardic, with intermittent episodes of irregular rhythm, murmur, rubs or gallops. No extremity edema. 2+ pedal pulses. No carotid bruits.  Abdomen: Soft, non tender, No hepatosplenomegaly. Bowel sounds positive.  Musculoskeletal: no clubbing / cyanosis. Moves all extremities Skin: no jaundice, No lesions.  Neurologic: Patient does not respond to pain on noxious stimuli.  Strength unable to obtain, as the patient cannot interact Psychiatric:   Patient is lethargic, and unable to interact.    Labs on Admission: I have personally reviewed  following labs and imaging studies  CBC: Recent Labs  Lab 04/17/18 0242 04/22/18 0852  WBC 8.5 15.4*  HGB 8.8* 10.3*  HCT 27.6* 32.2*  MCV 92.9 93.3  PLT 231 650    Basic Metabolic Panel: Recent Labs  Lab 04/16/18 0859 04/17/18 0242 04/22/18 0852  NA 143 137 137  K 3.0* 4.1 2.6*  CL 117* 111 110  CO2 19* 17* 19*  GLUCOSE 116* 198* 84  BUN 8 8 9   CREATININE 0.84 0.74 0.72  CALCIUM 7.3* 7.5* 7.7*    GFR: Estimated Creatinine Clearance: 34.6 mL/min (by C-G formula based on SCr of 0.72 mg/dL).  Liver Function Tests: Recent Labs  Lab 04/22/18 0852  AST 18  ALT 13  ALKPHOS 57  BILITOT 0.8  PROT 4.3*  ALBUMIN 1.9*   No results for input(s): LIPASE, AMYLASE in the last 168 hours. No results for input(s): AMMONIA in the last 168 hours.  Coagulation Profile: No results for input(s): INR, PROTIME in the last 168 hours.  Cardiac Enzymes: Recent Labs  Lab 04/15/18 1859  TROPONINI 0.06*    BNP (last 3 results) No results for input(s): PROBNP in the last 8760 hours.  HbA1C: No results for input(s): HGBA1C in the last 72 hours.  CBG: Recent Labs  Lab 04/15/18 2001 04/16/18 0025 04/16/18 0723 04/16/18 1235 04/16/18 1616  GLUCAP 75 67* 115* 93 83    Lipid Profile: No results for input(s): CHOL, HDL, LDLCALC, TRIG, CHOLHDL, LDLDIRECT in the last 72 hours.  Thyroid Function Tests: No results for input(s): TSH, T4TOTAL, FREET4, T3FREE, THYROIDAB in the last 72 hours.  Anemia Panel: No results for input(s): VITAMINB12, FOLATE, FERRITIN, TIBC, IRON, RETICCTPCT in the last 72 hours.  Urine analysis:    Component Value Date/Time   COLORURINE AMBER (A) 04/15/2018 0055   APPEARANCEUR TURBID (A) 04/15/2018 0055   LABSPEC 1.016 04/15/2018 0055   PHURINE 6.0 04/15/2018 0055   GLUCOSEU NEGATIVE 04/15/2018 0055   HGBUR SMALL (A) 04/15/2018 0055   BILIRUBINUR NEGATIVE 04/15/2018 0055   KETONESUR NEGATIVE 04/15/2018 0055   PROTEINUR 30 (  A) 04/15/2018 0055     UROBILINOGEN 0.2 10/31/2014 1945   NITRITE POSITIVE (A) 04/15/2018 0055   LEUKOCYTESUR LARGE (A) 04/15/2018 0055    Sepsis Labs: @LABRCNTIP (procalcitonin:4,lacticidven:4) ) Recent Results (from the past 240 hour(s))  Urine culture     Status: Abnormal   Collection Time: 04/15/18 12:57 AM  Result Value Ref Range Status   Specimen Description URINE, CATHETERIZED  Final   Special Requests   Final    NONE Performed at [facility], [address] [place], [place] [address]    Culture (A)  Final    >=100,000 COLONIES/mL ESCHERICHIA COLI Confirmed Extended Spectrum Beta-Lactamase Producer (ESBL).  In bloodstream infections from ESBL organisms, carbapenems are preferred over piperacillin/tazobactam. They are shown to have a lower risk of mortality.    Report Status 04/18/2018 FINAL  Final   Organism ID, Bacteria ESCHERICHIA COLI (A)  Final      Susceptibility   Escherichia coli - MIC*    AMPICILLIN >=32 RESISTANT Resistant     CEFAZOLIN >=64 RESISTANT Resistant     CEFTRIAXONE >=64 RESISTANT Resistant     CIPROFLOXACIN >=4 RESISTANT Resistant     GENTAMICIN 4 SENSITIVE Sensitive     IMIPENEM <=0.25 SENSITIVE Sensitive     NITROFURANTOIN 64 INTERMEDIATE Intermediate     TRIMETH/SULFA >=320 RESISTANT Resistant     AMPICILLIN/SULBACTAM 16 INTERMEDIATE Intermediate     PIP/TAZO <=4 SENSITIVE Sensitive     Extended ESBL POSITIVE Resistant     * >=100,000 COLONIES/mL ESCHERICHIA COLI  Culture, blood (Routine X 2) w Reflex to ID Panel     Status: None   Collection Time: 04/15/18  1:06 AM  Result Value Ref Range Status   Specimen Description BLOOD RIGHT FOREARM  Final   Special Requests   Final    BOTTLES DRAWN AEROBIC AND ANAEROBIC Blood Culture adequate volume   Culture   Final    NO GROWTH 5 DAYS Performed at [facility], [address], [place], [place] [address]    Report Status 04/20/2018 FINAL  Final  Culture, blood (Routine X 2) w Reflex to ID Panel      Status: None   Collection Time: 04/15/18  1:10 AM  Result Value Ref Range Status   Specimen Description BLOOD LEFT ANTECUBITAL  Final   Special Requests   Final    BOTTLES DRAWN AEROBIC AND ANAEROBIC Blood Culture adequate volume   Culture   Final    NO GROWTH 5 DAYS Performed at [facility], [place] [address], [place], [place] [address]    Report Status 04/20/2018 FINAL  Final  MRSA PCR Screening     Status: None   Collection Time: 04/15/18  6:58 AM  Result Value Ref Range Status   MRSA by PCR NEGATIVE NEGATIVE Final    Comment:        The GeneXpert MRSA Assay (FDA approved for NASAL specimens only), is one component of a comprehensive MRSA colonization surveillance program. It is not intended to diagnose MRSA infection nor to guide or monitor treatment for MRSA infections. Performed at [facility], [place] [address], [place], [place] [address]      Radiological Exams on Admission: Dg Chest Port 1 View  Result Date: 04/22/2018 CLINICAL DATA:  Altered mental status. EXAM: PORTABLE CHEST 1 VIEW COMPARISON:  04/15/2018 FINDINGS: Hazy obscuration of both hemidiaphragms. Atherosclerotic calcification of the aortic arch. Densely calcified mitral valve. Heart size within normal limits for AP projection.  Lower cervical plate and screw fixator. IMPRESSION: 1. Indistinct obscuration of both hemidiaphragms favoring atelectasis or pneumonia. 2.  Aortic Atherosclerosis (ICD10-I70.0). 3. Densely calcified mitral valve. Electronically Signed   By: Van Clines M.D.   On: 04/22/2018 09:19    EKG: Independently reviewed.  Assessment/Plan Active Problems:   End of life care   End of life issues:  Workup in the ED is incomplete as the patient is in the hospice program. Had a lengthy discussion with the family regarding the Care plan. At this point would proceed with call for comfort measures Admit with expectation that the patient will die in hospital Comfort measures order set   Palliative care consult  Active medical issues would not be pursued due to the current status.    DVT prophylaxis:  None Code Status:    DNR  Family Communication:  Discussed with daughter, POA Disposition Plan: Expect patient to be discharged to home after condition improves Consults called:    Palliative Care  Admission status: MedSurg, to be dc to [facility] if patient survives her hospitalization through the weekend   Sharene Butters, PA-C [facility]   Amion text  [phone]   04/22/2018, 4:14 PM

## 2018-04-22 NOTE — ED Triage Notes (Signed)
Pt  From [facility] ,no current Hx  From staff . Hr 30 per EMS no Staff at bed side to give report to EMS.  Pt alert and crying out on arrival to room.

## 2018-04-22 NOTE — ED Notes (Signed)
Help get patient undress on the monitor did ekg shown to er doctor patient is resting with call bell in reach and nurse at bedside

## 2018-04-22 NOTE — ED Triage Notes (Signed)
[facility] to confirm Pt does have an DNR. [facility] Staff confirmed having the original  DNR form. This was reported to EDP.

## 2018-04-22 NOTE — Care Management (Signed)
ED CM notified of Residential Hospice needs.  ED CSW was consulted to follow for residential Hospice needs.

## 2018-04-22 NOTE — ED Provider Notes (Signed)
[facility] Emergency Department Provider Note MRN:  [id number]  Arrival date & time: 04/22/18     Chief Complaint   Altered Mental Status and Bradycardia   History of Present Illness   Jacqueline Robles is a 84 y.o. year-old female with a history of hypertension, hyperlipidemia presenting to the ED with chief complaint of altered mental status.  Patient is arrived with no report from EMS, no accompanying family.  Patient is unable to answer questions, unable to follow commands.  I was unable to obtain an accurate HPI, PMH, or ROS due to the patient's altered mental status.  Review of Systems  Positive for altered mental status.  Patient's Health History    Past Medical History:  Diagnosis Date  . C. difficile colitis   . Depression   . Hyperlipidemia   . Hypertension   . Osteoporosis   . Thyroid disease   . UTI (lower urinary tract infection)     Past Surgical History:  Procedure Laterality Date  . FOOT SURGERY    . neck surgery      Family History  Problem Relation Age of Onset  . Hypertension Son     Social History   Socioeconomic History  . Marital status: Widowed    Spouse name: Not on file  . Number of children: Not on file  . Years of education: Not on file  . Highest education level: Not on file  Occupational History  . Not on file  Social Needs  . Financial resource strain: Not hard at all  . Food insecurity:    Worry: Never true    Inability: Never true  . Transportation needs:    Medical: No    Non-medical: No  Tobacco Use  . Smoking status: Never Smoker  . Smokeless tobacco: Never Used  Substance and Sexual Activity  . Alcohol use: No  . Drug use: No  . Sexual activity: Not on file  Lifestyle  . Physical activity:    Days per week: 0 days    Minutes per session: 0 min  . Stress: Not at all  Relationships  . Social connections:    Talks on phone: Twice a week    Gets together: Twice a week    Attends religious service:  Never    Active member of club or organization: No    Attends meetings of clubs or organizations: Never    Relationship status: Widowed  . Intimate partner violence:    Fear of current or ex partner: No    Emotionally abused: No    Physically abused: No    Forced sexual activity: No  Other Topics Concern  . Not on file  Social History Narrative   Family is very involved in care.     Physical Exam  Vital Signs and Nursing Notes reviewed Vitals:   04/22/18 1445 04/22/18 1615  BP:    Pulse: (!) 30 (!) 32  Resp: 15 14  Temp:    SpO2: 97% 97%    CONSTITUTIONAL: Ill-appearing, NAD NEURO: Somnolent, opens eyes to voice, not following commands, moving all extremities, intermittent groaning EYES:  eyes equal and reactive ENT/NECK:  no LAD, no JVD CARDIO: Bradycardic rate, well-perfused, normal S1 and S2 PULM:  CTAB no wheezing or rhonchi GI/GU:  normal bowel sounds, non-distended, non-tender MSK/SPINE:  No gross deformities, no edema SKIN:  no rash, atraumatic PSYCH:  Appropriate speech and behavior  Diagnostic and Interventional Summary    EKG Interpretation  Date/Time:    Ventricular Rate:    PR Interval:    QRS Duration:   QT Interval:    QTC Calculation:   R Axis:     Text Interpretation:        Labs Reviewed  CBC - Abnormal; Notable for the following components:      Result Value   WBC 15.4 (*)    RBC 3.45 (*)    Hemoglobin 10.3 (*)    HCT 32.2 (*)    RDW 15.8 (*)    All other components within normal limits  COMPREHENSIVE METABOLIC PANEL - Abnormal; Notable for the following components:   Potassium 2.6 (*)    CO2 19 (*)    Calcium 7.7 (*)    Total Protein 4.3 (*)    Albumin 1.9 (*)    All other components within normal limits  I-STAT TROPONIN, ED  I-STAT CG4 LACTIC ACID, ED    DG Chest Port 1 View  Final Result      Medications  acetaminophen (TYLENOL) tablet 650 mg (has no administration in time range)    Or  acetaminophen (TYLENOL)  suppository 650 mg (has no administration in time range)  haloperidol (HALDOL) tablet 0.5 mg (has no administration in time range)    Or  haloperidol (HALDOL) 2 MG/ML solution 0.5 mg (has no administration in time range)    Or  haloperidol lactate (HALDOL) injection 0.5 mg (has no administration in time range)  ondansetron (ZOFRAN-ODT) disintegrating tablet 4 mg (has no administration in time range)    Or  ondansetron (ZOFRAN) injection 4 mg (has no administration in time range)  glycopyrrolate (ROBINUL) tablet 1 mg (has no administration in time range)    Or  glycopyrrolate (ROBINUL) injection 0.2 mg (has no administration in time range)    Or  glycopyrrolate (ROBINUL) injection 0.2 mg (has no administration in time range)  antiseptic oral rinse (BIOTENE) solution 15 mL (has no administration in time range)  polyvinyl alcohol (LIQUIFILM TEARS) 1.4 % ophthalmic solution 1 drop (has no administration in time range)  morphine CONCENTRATE 10 MG/0.5ML oral solution 5 mg (has no administration in time range)    Or  morphine CONCENTRATE 10 MG/0.5ML oral solution 5 mg (has no administration in time range)  HYDROmorphone (DILAUDID) injection 0.5 mg (has no administration in time range)  LORazepam (ATIVAN) tablet 0.5 mg (has no administration in time range)    Or  LORazepam (ATIVAN) 2 MG/ML concentrated solution 0.5 mg (has no administration in time range)    Or  LORazepam (ATIVAN) injection 0.5 mg (has no administration in time range)  diphenhydrAMINE (BENADRYL) injection 12.5 mg (has no administration in time range)  chlorproMAZINE (THORAZINE) 12.5 mg in sodium chloride 0.9 % 25 mL IVPB (has no administration in time range)     Procedures Critical Care  ED Course and Medical Decision Making  I have reviewed the triage vital signs and the nursing notes.  Pertinent labs & imaging results that were available during my care of the patient were reviewed by me and considered in my medical  decision making (see below for details). Clinical Course as of Apr 22 1640  Sat Apr 22, 2018  1300 Altered mental status in this 84 year old female with recent metabolic disarray electrolyte abnormalities, some episodes of V. tach requiring recent admission.  Altered again today, hemodynamically stable.  Long discussion with patient's daughter (power of attorney), interested in transition to full comfort care, hospice placement.  No longer interested in  blood draws, no longer interested in electrolyte management.  Case management consulted to attempt hospice enrollment today.   [MB]    Clinical Course User Index [MB] Maudie Flakes, MD    Unable to obtain official enrollment in a hospice program out of the home, family unable to provide enough care for patient at home currently.  Family is still very much on board with and in favor of comfort care, admitted to hospitalist service for further comfort measures and eventual placement.  Jacqueline Robles. Sedonia Small, MD [place] [email or web address]  Final Clinical Impressions(s) / ED Diagnoses     ICD-10-CM   1. End of life care Z51.5   2. Altered behavior R46.89 DG Chest [facility] 1 View    DG Chest Port 1 View  3. Hypokalemia E87.6     ED Discharge Orders    None         Maudie Flakes, MD 04/22/18 [phone]

## 2018-04-23 NOTE — Progress Notes (Signed)
PROGRESS NOTE  [place]  UJW:[id number] DOB: 1922/05/31  DOA: 04/22/2018 PCP: Hendricks Limes, MD   Brief Narrative:  Jacqueline Robles is a 84 y.o. female with medical history significant for hypothyroidism, history of TIA and vascular dementia, iron deficiency anemia, chronic pain issues, hypertension, hyperlipidemia, and recent admission for fever due to ESBL UTI, brought from the nursing home, with acute altered mental status, preceded by progressive overall clinical decline. Family feels patient is reaching her and live and requested hospice care.  Hospice as not bed availability and she admitted to hospitalist service for the management     Assessment & Plan:   Active Problems:   End of life care     End of life care :  continue comfort measures  palliative care follow-up  active medical issues would not be pursued due to current status   DVT prophylaxis:  none Code Status:  DNR, comfort measures Family Communication:  Disposition Plan:  Plan for discharge to [facility] if patient survives hospitalization through the weekend a   Consultants:   palliative medicine  Procedures:     Antimicrobials:      Subjective:  no new complaint  Objective:  Vitals:   04/22/18 1445 04/22/18 1615 04/22/18 1714 04/23/18 0552  BP:   (!) 171/50 (!) 174/40  Pulse: (!) 30 (!) 32 (!) 32 (!) 32  Resp: 15 14 14 14   Temp:   98.1 F (36.7 C) 98.3 F (36.8 C)  TempSrc:   Axillary Axillary  SpO2: 97% 97% 97% 98%  Weight:      Height:        Intake/Output Summary (Last 24 hours) at 04/23/2018 1045 Last data filed at 04/23/2018 0553 Gross per 24 hour  Intake -  Output 400 ml  Net -400 ml   Filed Weights   04/22/18 0835  Weight: 49.9 kg    Examination:  General exam:  Resting comfortably, no distress Respiratory system: Clear to auscultation. Respiratory effort normal. Cardiovascular system:  Bradycardic, regular, no gallop Gastrointestinal system: Abdomen is  nondistended, soft and nontender.  Central nervous system:  Lethargic a Extremities:  No cyanosis. .     Data Reviewed: I have personally reviewed following labs and imaging studies  CBC: Recent Labs  Lab 04/17/18 0242 04/22/18 0852  WBC 8.5 15.4*  HGB 8.8* 10.3*  HCT 27.6* 32.2*  MCV 92.9 93.3  PLT 231 562   Basic Metabolic Panel: Recent Labs  Lab 04/17/18 0242 04/22/18 0852  NA 137 137  K 4.1 2.6*  CL 111 110  CO2 17* 19*  GLUCOSE 198* 84  BUN 8 9  CREATININE 0.74 0.72  CALCIUM 7.5* 7.7*   GFR: Estimated Creatinine Clearance: 34.6 mL/min (by C-G formula based on SCr of 0.72 mg/dL). Liver Function Tests: Recent Labs  Lab 04/22/18 0852  AST 18  ALT 13  ALKPHOS 57  BILITOT 0.8  PROT 4.3*  ALBUMIN 1.9*   No results for input(s): LIPASE, AMYLASE in the last 168 hours. No results for input(s): AMMONIA in the last 168 hours. Coagulation Profile: No results for input(s): INR, PROTIME in the last 168 hours. Cardiac Enzymes: No results for input(s): CKTOTAL, CKMB, CKMBINDEX, TROPONINI in the last 168 hours. BNP (last 3 results) No results for input(s): PROBNP in the last 8760 hours. HbA1C: No results for input(s): HGBA1C in the last 72 hours. CBG: Recent Labs  Lab 04/16/18 1235 04/16/18 1616  GLUCAP 93 83   Lipid Profile: No results  for input(s): CHOL, HDL, LDLCALC, TRIG, CHOLHDL, LDLDIRECT in the last 72 hours. Thyroid Function Tests: No results for input(s): TSH, T4TOTAL, FREET4, T3FREE, THYROIDAB in the last 72 hours. Anemia Panel: No results for input(s): VITAMINB12, FOLATE, FERRITIN, TIBC, IRON, RETICCTPCT in the last 72 hours.  Sepsis Labs: Recent Labs  Lab 04/17/18 0242 04/22/18 0852 04/22/18 0936  WBC 8.5 15.4*  --   LATICACIDVEN  --   --  0.88    Recent Results (from the past 240 hour(s))  Urine culture     Status: Abnormal   Collection Time: 04/15/18 12:57 AM  Result Value Ref Range Status   Specimen Description URINE,  CATHETERIZED  Final   Special Requests   Final    NONE Performed at [facility], [address], [place], [place] [address]    Culture (A)  Final    >=100,000 COLONIES/mL ESCHERICHIA COLI Confirmed Extended Spectrum Beta-Lactamase Producer (ESBL).  In bloodstream infections from ESBL organisms, carbapenems are preferred over piperacillin/tazobactam. They are shown to have a lower risk of mortality.    Report Status 04/18/2018 FINAL  Final   Organism ID, Bacteria ESCHERICHIA COLI (A)  Final      Susceptibility   Escherichia coli - MIC*    AMPICILLIN >=32 RESISTANT Resistant     CEFAZOLIN >=64 RESISTANT Resistant     CEFTRIAXONE >=64 RESISTANT Resistant     CIPROFLOXACIN >=4 RESISTANT Resistant     GENTAMICIN 4 SENSITIVE Sensitive     IMIPENEM <=0.25 SENSITIVE Sensitive     NITROFURANTOIN 64 INTERMEDIATE Intermediate     TRIMETH/SULFA >=320 RESISTANT Resistant     AMPICILLIN/SULBACTAM 16 INTERMEDIATE Intermediate     PIP/TAZO <=4 SENSITIVE Sensitive     Extended ESBL POSITIVE Resistant     * >=100,000 COLONIES/mL ESCHERICHIA COLI  Culture, blood (Routine X 2) w Reflex to ID Panel     Status: None   Collection Time: 04/15/18  1:06 AM  Result Value Ref Range Status   Specimen Description BLOOD RIGHT FOREARM  Final   Special Requests   Final    BOTTLES DRAWN AEROBIC AND ANAEROBIC Blood Culture adequate volume   Culture   Final    NO GROWTH 5 DAYS Performed at [facility], [address], [place], [place] [address]    Report Status 04/20/2018 FINAL  Final  Culture, blood (Routine X 2) w Reflex to ID Panel     Status: None   Collection Time: 04/15/18  1:10 AM  Result Value Ref Range Status   Specimen Description BLOOD LEFT ANTECUBITAL  Final   Special Requests   Final    BOTTLES DRAWN AEROBIC AND ANAEROBIC Blood Culture adequate volume   Culture   Final    NO GROWTH 5 DAYS Performed at [facility], [place] [address] [place], [place] [address]    Report Status  04/20/2018 FINAL  Final  MRSA PCR Screening     Status: None   Collection Time: 04/15/18  6:58 AM  Result Value Ref Range Status   MRSA by PCR NEGATIVE NEGATIVE Final    Comment:        The GeneXpert MRSA Assay (FDA approved for NASAL specimens only), is one component of a comprehensive MRSA colonization surveillance program. It is not intended to diagnose MRSA infection nor to guide or monitor treatment for MRSA infections. Performed at [facility], [place] [address] [place], [place] [address]  Radiology Studies: Dg Chest Port 1 View  Result Date: 04/22/2018 CLINICAL DATA:  Altered mental status. EXAM: PORTABLE CHEST 1 VIEW COMPARISON:  04/15/2018 FINDINGS: Hazy obscuration of both hemidiaphragms. Atherosclerotic calcification of the aortic arch. Densely calcified mitral valve. Heart size within normal limits for AP projection. Lower cervical plate and screw fixator. IMPRESSION: 1. Indistinct obscuration of both hemidiaphragms favoring atelectasis or pneumonia. 2.  Aortic Atherosclerosis (ICD10-I70.0). 3. Densely calcified mitral valve. Electronically Signed   By: Van Clines M.D.   On: 04/22/2018 09:19        Scheduled Meds: Continuous Infusions: . chlorproMAZINE (THORAZINE) IV       LOS: 1 day    Time spent:25    Benito Mccreedy, MD [facility] Pager [phone]  If 7PM-7AM, please contact night-coverage [email or web address] Password [facility] 04/23/2018, 10:45 AM

## 2018-04-23 NOTE — Progress Notes (Signed)
Pt lying comfortably in bed. No signs of distress at this time.

## 2018-04-23 NOTE — Progress Notes (Addendum)
PMT no charge note  Patient's chart reviewed, reader is referred to Vinie Sill, NP with PMT's initial note from 04-18-18.  Patient admitted for end of life care, symptom management, [facility] following, awaiting hospice bed.   Continue current mode of care. No additional PMT specific recommendations at this time.   Loistine Chance MD [facility] health palliative medicine team [phone]

## 2018-04-23 NOTE — Progress Notes (Signed)
Hospice and [facility]) RN note.  Notified CSW there is no availability today for [employer] [facility]. [facility] liaison will follow up with CSW and family tomorrow or sooner if room becomes available.  Please do not hesitate to call with questions.  Thank you,  Farrel Gordon, RN, [place]

## 2018-04-24 DIAGNOSIS — R4689 Other symptoms and signs involving appearance and behavior: Secondary | ICD-10-CM

## 2018-04-24 MED ORDER — LORAZEPAM 2 MG/ML PO CONC: 0.6000 mg | Freq: Four times a day (QID) | ORAL | 0 refills | Status: AC | PRN

## 2018-04-24 MED ORDER — MORPHINE SULFATE (CONCENTRATE) 10 MG/0.5ML PO SOLN
5.0000 mg | Freq: Four times a day (QID) | ORAL | Status: DC
  Administered 2018-04-24: 5 mg via SUBLINGUAL

## 2018-04-24 MED ORDER — GLYCOPYRROLATE 1 MG PO TABS: 1.0000 mg | ORAL_TABLET | ORAL | Status: AC | PRN

## 2018-04-24 MED ORDER — MORPHINE SULFATE (CONCENTRATE) 10 MG/0.5ML PO SOLN: 5.0000 mg | ORAL | Status: AC | PRN

## 2018-04-24 NOTE — Progress Notes (Signed)
Patient discharged to [facility] via [facility].

## 2018-04-24 NOTE — Social Work (Signed)
Clinical Social Worker facilitated patient discharge including contacting patient family and facility to confirm patient discharge plans.  Clinical information faxed to facility and family agreeable with plan.  CSW arranged ambulance transport via [facility] to [facility] RN to call [phone]  with report prior to discharge.  Clinical Social Worker will sign off for now as social work intervention is no longer needed. Please consult us again if new need arises.  Jacqueline Robles H Floris Neuhaus, LCSWA Clinical Social Worker [phone]  

## 2018-04-24 NOTE — Social Work (Signed)
CSW spoke with pt son in law and PMT physician, pt family would like to accept bed at [facility], care team has assessed medical stability and determined that she is stable for transfer today.   [place] made aware.   [place] Mt, [place] Work [phone]

## 2018-04-24 NOTE — Social Work (Signed)
CSW received hand off verbally from ED social worker Jeanette Caprice, aware pt is comfort care and awaits bed at [facility].   Alexander Mt, [place] Work [phone]

## 2018-04-24 NOTE — Consult Note (Signed)
Consultation Note Date: 04/24/2018   Patient Name: Jacqueline Robles  DOB: 1922/08/02  MRN: [id number]  Age / Sex: 84 y.o., female  PCP: Hendricks Limes, MD Referring Physician: Jonetta Osgood, MD  Reason for Consultation: Establishing goals of care  HPI/Patient Profile: 84 y.o. female  admitted on 04/22/2018   Clinical Assessment and Goals of Care:  84 year old lady, [facility], past medical history significant for vascular dementia, transient ischemic attack, hypothyroidism, hypertension, dyslipidemia, anemia, chronic pain, aortic stenosis. Recent hospitalization for ESBL urinary tract infection. Patient was seen by PMT in this recent hospitalization.  Patient has been admitted for hospice care, comfort measures only given her progressive ongoing functional and cognitive decline, minimal-nil oral intake, high symptom burden of pain, generalized restlessness.   Patient admitted under hospital medicine service, is being followed by hospice and palliative care center of [facility]. Palliative medicine consultation for overseeing terminal care, comfort measures, address safest possible disposition options.  Patient is a weak appearing lady resting in bed. She is able to weakly state her name. She does not appear to be in severe distress. Episodically, she moans. Call placed to daughter Opal Sidles healthcare power of attorney agent. She subsequently arrived at the bedside.  Met with the patient, daughter Opal Sidles, son-in-law Stoney Bang, son Elta Guadeloupe and other family members present in the room. Introduced myself and palliative care as follows: Palliative medicine is specialized medical care for people living with serious illness. It focuses on providing relief from the symptoms and stress of a serious illness. The goal is to improve quality of life for both the patient and the  family.  Discussed about continuing full scope of comfort measures only. Discussed about continuing a mode of care that focuses exclusively on the patient's comfort. Patient does not have a peripheral IV. At times, she does appear to be in pain/distress. Family does not want peripheral IV placed. Will add low-dose scheduled sublingual morphine solution.  Goals wishes and values discussed. Family is appreciative of the care being provided at [facility]. Prognosis discussed. Patient is not imminently dying. Discussed that prognosis still could be several days to less than 2 weeks at this point. Discussed about the type of care that can be provided in a residential hospice setting. All of their questions answered to the best of my ability. Subsequently, also discussed with CSW along with the patient's family.  See additional discussions/recommendations listed below. Thank you for the consult.  NEXT OF KIN  daughter Georgann Housekeeper, son Elta Guadeloupe, son-in-law Stoney Bang are all present at the bedside.  SUMMARY OF RECOMMENDATIONS    1. Agree with DO NOT RESUSCITATE/DO NOT INTUBATE 2. Comfort measures only. Will add sublingual morphine low-dose, on a scheduled basis. Patient's medication history reviewed. Agree with current comfort measures. 3. Disposition: Family initially stated that they were happy with the care the patient was getting at [facility] and wished to continue with this hospitalization for comfort measures. Discussed about the type of care that can be provided in  a residential hospice setting. Discussed that patient's vital signs are stable, she is awake, reasonably alert, reasonably interactive. Discussed about continuing comfort measures and a residential hospice setting. Family is agreeable. Also discussed with CSW. TRH M.D. Notified. Patient likely to go to hospice once there is a bed available.   Code Status/Advance Care Planning:  DNR    Symptom Management:    Add SL morphine low dose  scheduled.   Palliative Prophylaxis:   Delirium Protocol  Additional Recommendations (Limitations, Scope, Preferences):  Full Comfort Care  Psycho-social/Spiritual:   Desire for further Chaplaincy support:yes  Additional Recommendations: Education on Hospice  Prognosis:   < 2 weeks  Discharge Planning: Hospice facility      Primary Diagnoses: Present on Admission: **None**   I have reviewed the medical record, interviewed the patient and family, and examined the patient. The following aspects are pertinent.  Past Medical History:  Diagnosis Date  . C. difficile colitis   . Depression   . Hyperlipidemia   . Hypertension   . Osteoporosis   . Thyroid disease   . UTI (lower urinary tract infection)    Social History   Socioeconomic History  . Marital status: Widowed    Spouse name: Not on file  . Number of children: Not on file  . Years of education: Not on file  . Highest education level: Not on file  Occupational History  . Not on file  Social Needs  . Financial resource strain: Not hard at all  . Food insecurity:    Worry: Never true    Inability: Never true  . Transportation needs:    Medical: No    Non-medical: No  Tobacco Use  . Smoking status: Never Smoker  . Smokeless tobacco: Never Used  Substance and Sexual Activity  . Alcohol use: No  . Drug use: No  . Sexual activity: Not on file  Lifestyle  . Physical activity:    Days per week: 0 days    Minutes per session: 0 min  . Stress: Not at all  Relationships  . Social connections:    Talks on phone: Twice a week    Gets together: Twice a week    Attends religious service: Never    Active member of club or organization: No    Attends meetings of clubs or organizations: Never    Relationship status: Widowed  Other Topics Concern  . Not on file  Social History Narrative   Family is very involved in care.   Family History  Problem Relation Age of Onset  . Hypertension Son     Scheduled Meds: Continuous Infusions: . chlorproMAZINE (THORAZINE) IV     PRN Meds:.acetaminophen **OR** acetaminophen, antiseptic oral rinse, chlorproMAZINE (THORAZINE) IV, diphenhydrAMINE, glycopyrrolate **OR** glycopyrrolate **OR** glycopyrrolate, haloperidol **OR** haloperidol **OR** haloperidol lactate, HYDROmorphone (DILAUDID) injection, LORazepam **OR** LORazepam **OR** LORazepam, morphine CONCENTRATE **OR** morphine CONCENTRATE, ondansetron **OR** ondansetron (ZOFRAN) IV, polyvinyl alcohol Medications Prior to Admission:  Prior to Admission medications   Medication Sig Start Date End Date Taking? Authorizing Provider  Calcium Carbonate-Vitamin D3 (CALCIUM 600-D) 600-400 MG-UNIT TABS Take 1 each by mouth daily.   Yes [provider]  cholecalciferol (VITAMIN D) 1000 units tablet Take 1,000 Units daily by mouth.    Yes [provider]  clopidogrel (PLAVIX) 75 MG tablet Take 75 mg by mouth daily.   Yes [provider]  ferrous sulfate 325 (65 FE) MG tablet Take 325 mg by mouth daily with breakfast.  Yes [provider]  fexofenadine (ALLEGRA) 180 MG tablet Take 180 mg daily by mouth.    Yes [provider]  fluticasone (FLONASE) 50 MCG/ACT nasal spray Place 2 sprays daily into both nostrils.    Yes [provider]  levothyroxine (SYNTHROID, LEVOTHROID) 88 MCG tablet Take 88 mcg by mouth daily before breakfast.   Yes [provider]  Multiple Vitamins-Minerals (ELDERTONIC PO) Take 15 mLs by mouth 2 (two) times daily.    Yes [provider]  Multiple Vitamins-Minerals (MULTIVITAMIN WITH MINERALS) tablet Take 1 tablet by mouth daily.   Yes [provider]  Multiple Vitamins-Minerals (PRESERVISION AREDS 2 PO) Take 1 capsule by mouth 2 (two) times daily.    Yes [provider]  nitrofurantoin, macrocrystal-monohydrate, (MACROBID) 100 MG capsule Take 100 mg by mouth 2 (two) times daily. 04/19/18 04/24/18  Yes [provider]  NUTRITIONAL SUPPLEMENT LIQD Take 120 mLs by mouth 3 (three) times daily. MedPass    Yes [provider]  senna-docusate (SENOKOT-S) 8.6-50 MG tablet Take 2 tablets by mouth at bedtime.   Yes [provider]  sertraline (ZOLOFT) 50 MG tablet Take 50 mg by mouth daily.    Yes [provider]  acetaminophen (TYLENOL) 500 MG tablet Take 1,000 mg by mouth 2 (two) times daily as needed for moderate pain.     [provider]  bisacodyl (DULCOLAX) 10 MG suppository Place 10 mg rectally daily as needed for moderate constipation.    [provider]  magnesium hydroxide (MILK OF MAGNESIA) 400 MG/5ML suspension Take 30 mLs by mouth daily as needed for mild constipation.    [provider]  Melatonin 10 MG SUBL Place 1 tablet under the tongue at bedtime as needed (sleep).    [provider]  Propylene Glycol (SYSTANE BALANCE) 0.6 % SOLN Apply 1 drop to eye 3 (three) times daily as needed (Irritation).    [provider]  Sodium Phosphates (RA SALINE ENEMA RE) Place 1 Applicatorful rectally daily as needed (constipation).    [provider]   Allergies  Allergen Reactions  . Fosamax [Alendronate Sodium] Other (See Comments)    Unknown, family is stating there is no allergic/adverse reaction to drug it was just discontinued.  [name] Hctz [Hydrochlorothiazide] Other (See Comments)    unknownUnknown, family is stating there is no allergic/adverse reaction to drug it was just discontinued.  . Latex     Unknown, family is stating there is no allergic/adverse reaction to drug it was just discontinued.  . Neosporin [Neomycin-Bacitracin Zn-Polymyx] Other (See Comments)    On MAR  . Norvasc [Amlodipine Besylate]     Unknown, family is stating there is no allergic/adverse reaction to drug it was just discontinued.  . Bacitracin-Neomycin-Polymyxin Rash  . Lanolin Rash  . Naproxen Rash  . Polysporin  [Bacitracin-Polymyxin B] Rash    rash   Review of Systems +weakness +moaning at times  Physical Exam Weak frail elderly lady Chronically ill Awake, reasonably alert Tracks me in the room Moans in response to questions asked Has a systolic murmur Abdomen is not distended Shallow clear breath sounds Trace edema Extremities warm to touch, no coolness no mottling evident   Vital Signs: BP (!) 162/46 (BP Location: Right Arm)   Pulse (!) 32   Temp 97.9 F (36.6 C) (Axillary)   Resp 14   Ht _0  (1.575 m)   Wt 49.9 kg   SpO2 95%   BMI 20.12 kg/m  Pain Scale:  Faces   Pain Score: Asleep   SpO2: SpO2: 95 % O2 Device:SpO2: 95 % O2 Flow Rate: .   IO: Intake/output summary:   Intake/Output Summary (Last 24 hours) at 04/24/2018 1246 Last data filed at 04/24/2018 1033 Gross per 24 hour  Intake 0 ml  Output 550 ml  Net -550 ml    LBM:   Baseline Weight: Weight: 49.9 kg Most recent weight: Weight: 49.9 kg     Palliative Assessment/Data:   PPS 30%  Time In:  11.30 Time Out:  12.30 Time Total:  60 min  Greater than 50%  of this time was spent counseling and coordinating care related to the above assessment and plan.  Signed by: Loistine Chance, MD  [phone]  Please contact Palliative Medicine Team phone at [phone] for questions and concerns.  For individual provider: See Shea Evans

## 2018-04-24 NOTE — Discharge Summary (Signed)
PATIENT DETAILS Name: Jacqueline Robles Age: 84 y.o. Sex: female Date of Birth: 12-Mar-1922 MRN: [id number]. Admitting Physician: Lady Deutscher, MD [id number], Darrick Penna, MD  Admit Date: 04/22/2018 Discharge date: 04/24/2018  Recommendations for Outpatient Follow-up:  Optimize comfort medications  Admitted From:  Home  Disposition: Residential hospice   Home Health: None  Equipment/Devices: None  Discharge Condition: Hospice/comort care  CODE STATUS:  DNR  Diet recommendation:  Regular/Comfort feeds as tolerated  Brief Summary: See H&P, Labs, Consult and Test reports for all details in brief, patient is a elderly 84 year old female with history of dementia, recent admission for fever secondary to ESBL UTI-presented to the nursing home with worsening mental status than her usual baseline, poor oral intake.  After discussion with family-patient was transition to full comfort measures.  There were no beds available at residential hospice, hence the patient was admitted to the hospitalist service.  See below for further details  Brief Hospital Course: Acute metabolic encephalopathy in the setting of advanced dementia, dehydration, failure to thrive syndrome-very lethargic this morning-on full comfort measures-bed now available at residential hospice-stable for discharge.  And by palliative care during his hospital stay-palliative care team has discussed with the family-okay to discharge to residential hospice.Full comfort measures in effect  Vascular dementia  History of TIA: No longer on Plavix  Hypothyroidism: Since transition to full comfort measures-no longer on levothyroxine.  Procedures/Studies: None  Discharge Diagnoses:  Active Problems:   End of life care  Discharge Instructions:  Activity:  As tolerated  Discharge Instructions    Diet general   Complete by:  As directed    Increase activity slowly   Complete by:  As directed      Allergies as of  04/24/2018      Reactions   Fosamax [alendronate Sodium] Other (See Comments)   Unknown, family is stating there is no allergic/adverse reaction to drug it was just discontinued.   Hctz [hydrochlorothiazide] Other (See Comments)   unknownUnknown, family is stating there is no allergic/adverse reaction to drug it was just discontinued.   Latex    Unknown, family is stating there is no allergic/adverse reaction to drug it was just discontinued.   Neosporin [neomycin-bacitracin Zn-polymyx] Other (See Comments)   On MAR   Norvasc [amlodipine Besylate]    Unknown, family is stating there is no allergic/adverse reaction to drug it was just discontinued.   Bacitracin-neomycin-polymyxin Rash   Lanolin Rash   Naproxen Rash   Polysporin [bacitracin-polymyxin B] Rash   rash      Medication List    STOP taking these medications   acetaminophen 500 MG tablet Commonly known as:  TYLENOL   bisacodyl 10 MG suppository Commonly known as:  DULCOLAX   CALCIUM 600-D 600-400 MG-UNIT Tabs Generic drug:  Calcium Carbonate-Vitamin D3   cholecalciferol 1000 units tablet Commonly known as:  VITAMIN D   clopidogrel 75 MG tablet Commonly known as:  PLAVIX   ELDERTONIC PO   ferrous sulfate 325 (65 FE) MG tablet   fexofenadine 180 MG tablet Commonly known as:  ALLEGRA   fluticasone 50 MCG/ACT nasal spray Commonly known as:  FLONASE   levothyroxine 88 MCG tablet Commonly known as:  SYNTHROID, LEVOTHROID   magnesium hydroxide 400 MG/5ML suspension Commonly known as:  MILK OF MAGNESIA   Melatonin 10 MG Subl   multivitamin with minerals tablet   nitrofurantoin (macrocrystal-monohydrate) 100 MG capsule Commonly known as:  MACROBID   NUTRITIONAL SUPPLEMENT Liqd   PRESERVISION AREDS  2 PO   RA SALINE ENEMA RE   senna-docusate 8.6-50 MG tablet Commonly known as:  Senokot-S   sertraline 50 MG tablet Commonly known as:  ZOLOFT   SYSTANE BALANCE 0.6 % Soln Generic drug:  Propylene  Glycol     TAKE these medications   glycopyrrolate 1 MG tablet Commonly known as:  ROBINUL Take 1 tablet (1 mg total) by mouth every 4 (four) hours as needed (excessive secretions).   LORazepam 2 MG/ML concentrated solution Commonly known as:  ATIVAN Place 0.3 mLs (0.6 mg total) under the tongue every 6 (six) hours as needed for anxiety.   morphine CONCENTRATE 10 MG/0.5ML Soln concentrated solution Take 0.25 mLs (5 mg total) by mouth every 2 (two) hours as needed for severe pain or shortness of breath (or dyspnea).      Follow-up Information    Hendricks Limes, MD Follow up.   Specialty:  Internal Medicine Why:  as needed Contact information: [place] [address] [phone]          Allergies  Allergen Reactions  . Fosamax [Alendronate Sodium] Other (See Comments)    Unknown, family is stating there is no allergic/adverse reaction to drug it was just discontinued.  [name] Hctz [Hydrochlorothiazide] Other (See Comments)    unknownUnknown, family is stating there is no allergic/adverse reaction to drug it was just discontinued.  . Latex     Unknown, family is stating there is no allergic/adverse reaction to drug it was just discontinued.  . Neosporin [Neomycin-Bacitracin Zn-Polymyx] Other (See Comments)    On MAR  . Norvasc [Amlodipine Besylate]     Unknown, family is stating there is no allergic/adverse reaction to drug it was just discontinued.  . Bacitracin-Neomycin-Polymyxin Rash  . Lanolin Rash  . Naproxen Rash  . Polysporin [Bacitracin-Polymyxin B] Rash    rash    Consultations:   Palliative care  Other Procedures/Studies: Dg Chest Port 1 View  Result Date: 04/22/2018 CLINICAL DATA:  Altered mental status. EXAM: PORTABLE CHEST 1 VIEW COMPARISON:  04/15/2018 FINDINGS: Hazy obscuration of both hemidiaphragms. Atherosclerotic calcification of the aortic arch. Densely calcified mitral valve. Heart size within normal limits for AP projection. Lower  cervical plate and screw fixator. IMPRESSION: 1. Indistinct obscuration of both hemidiaphragms favoring atelectasis or pneumonia. 2.  Aortic Atherosclerosis (ICD10-I70.0). 3. Densely calcified mitral valve. Electronically Signed   By: Van Clines M.D.   On: 04/22/2018 09:19   Dg Chest Portable 1 View  Result Date: 04/15/2018 CLINICAL DATA:  Fever this evening. EXAM: PORTABLE CHEST 1 VIEW COMPARISON:  12/08/2016 FINDINGS: Examination is technically limited due to patient rotation. As visualized, the heart size and pulmonary vascularity appear normal. Probable emphysematous changes and scattered fibrosis in the lungs. No focal airspace disease or consolidation is identified. No blunting of costophrenic angles. No pneumothorax. Calcification of the aorta. Postoperative changes in the cervical spine. IMPRESSION: Probable emphysematous changes in the lungs. No evidence of active pulmonary disease. Electronically Signed   By: Lucienne Capers M.D.   On: 04/15/2018 01:25      TODAY-DAY OF DISCHARGE:  Subjective:   [place] remains very lethargic this morning but is otherwise stable.  Objective:   Blood pressure (!) 162/46, pulse (!) 32, temperature 97.9 F (36.6 C), temperature source Axillary, resp. rate 14, height 5\' 2"  (1.575 m), weight 49.9 kg, SpO2 95 %.  Intake/Output Summary (Last 24 hours) at 04/24/2018 1353 Last data filed at 04/24/2018 1033 Gross per 24 hour  Intake 0 ml  Output 550 ml  Net -550 ml   Filed Weights   04/22/18 0835  Weight: 49.9 kg    Exam: Lethargic-awakens but mumbles incoherently Chest: Clear to auscultation anteriorly Abdomen: Soft nontender nondistended   PERTINENT RADIOLOGIC STUDIES: Dg Chest Port 1 View  Result Date: 04/22/2018 CLINICAL DATA:  Altered mental status. EXAM: PORTABLE CHEST 1 VIEW COMPARISON:  04/15/2018 FINDINGS: Hazy obscuration of both hemidiaphragms. Atherosclerotic calcification of the aortic arch. Densely calcified mitral  valve. Heart size within normal limits for AP projection. Lower cervical plate and screw fixator. IMPRESSION: 1. Indistinct obscuration of both hemidiaphragms favoring atelectasis or pneumonia. 2.  Aortic Atherosclerosis (ICD10-I70.0). 3. Densely calcified mitral valve. Electronically Signed   By: Van Clines M.D.   On: 04/22/2018 09:19   Dg Chest Portable 1 View  Result Date: 04/15/2018 CLINICAL DATA:  Fever this evening. EXAM: PORTABLE CHEST 1 VIEW COMPARISON:  12/08/2016 FINDINGS: Examination is technically limited due to patient rotation. As visualized, the heart size and pulmonary vascularity appear normal. Probable emphysematous changes and scattered fibrosis in the lungs. No focal airspace disease or consolidation is identified. No blunting of costophrenic angles. No pneumothorax. Calcification of the aorta. Postoperative changes in the cervical spine. IMPRESSION: Probable emphysematous changes in the lungs. No evidence of active pulmonary disease. Electronically Signed   By: Lucienne Capers M.D.   On: 04/15/2018 01:25     PERTINENT LAB RESULTS: CBC: Recent Labs    04/22/18 0852  WBC 15.4*  HGB 10.3*  HCT 32.2*  PLT 235   CMET CMP     Component Value Date/Time   NA 137 04/22/2018 0852   NA 140 03/31/2018   K 2.6 (LL) 04/22/2018 0852   CL 110 04/22/2018 0852   CO2 19 (L) 04/22/2018 0852   GLUCOSE 84 04/22/2018 0852   BUN 9 04/22/2018 0852   BUN 13 03/31/2018   CREATININE 0.72 04/22/2018 0852   CALCIUM 7.7 (L) 04/22/2018 0852   PROT 4.3 (L) 04/22/2018 0852   ALBUMIN 1.9 (L) 04/22/2018 0852   AST 18 04/22/2018 0852   ALT 13 04/22/2018 0852   ALKPHOS 57 04/22/2018 0852   BILITOT 0.8 04/22/2018 0852   GFRNONAA >60 04/22/2018 0852   GFRAA >60 04/22/2018 0852    GFR Estimated Creatinine Clearance: 34.6 mL/min (by C-G formula based on SCr of 0.72 mg/dL). No results for input(s): LIPASE, AMYLASE in the last 72 hours. No results for input(s): CKTOTAL, CKMB,  CKMBINDEX, TROPONINI in the last 72 hours. Invalid input(s): POCBNP No results for input(s): DDIMER in the last 72 hours. No results for input(s): HGBA1C in the last 72 hours. No results for input(s): CHOL, HDL, LDLCALC, TRIG, CHOLHDL, LDLDIRECT in the last 72 hours. No results for input(s): TSH, T4TOTAL, T3FREE, THYROIDAB in the last 72 hours.  Invalid input(s): FREET3 No results for input(s): VITAMINB12, FOLATE, FERRITIN, TIBC, IRON, RETICCTPCT in the last 72 hours. Coags: No results for input(s): INR in the last 72 hours.  Invalid input(s): PT Microbiology: Recent Results (from the past 240 hour(s))  Urine culture     Status: Abnormal   Collection Time: 04/15/18 12:57 AM  Result Value Ref Range Status   Specimen Description URINE, CATHETERIZED  Final   Special Requests   Final    NONE Performed at [facility], [address] [place], [place] [address]    Culture (A)  Final    >=100,000 COLONIES/mL ESCHERICHIA COLI Confirmed Extended Spectrum Beta-Lactamase Producer (ESBL).  In  bloodstream infections from ESBL organisms, carbapenems are preferred over piperacillin/tazobactam. They are shown to have a lower risk of mortality.    Report Status 04/18/2018 FINAL  Final   Organism ID, Bacteria ESCHERICHIA COLI (A)  Final      Susceptibility   Escherichia coli - MIC*    AMPICILLIN >=32 RESISTANT Resistant     CEFAZOLIN >=64 RESISTANT Resistant     CEFTRIAXONE >=64 RESISTANT Resistant     CIPROFLOXACIN >=4 RESISTANT Resistant     GENTAMICIN 4 SENSITIVE Sensitive     IMIPENEM <=0.25 SENSITIVE Sensitive     NITROFURANTOIN 64 INTERMEDIATE Intermediate     TRIMETH/SULFA >=320 RESISTANT Resistant     AMPICILLIN/SULBACTAM 16 INTERMEDIATE Intermediate     PIP/TAZO <=4 SENSITIVE Sensitive     Extended ESBL POSITIVE Resistant     * >=100,000 COLONIES/mL ESCHERICHIA COLI  Culture, blood (Routine X 2) w Reflex to ID Panel     Status: None   Collection Time: 04/15/18  1:06 AM    Result Value Ref Range Status   Specimen Description BLOOD RIGHT FOREARM  Final   Special Requests   Final    BOTTLES DRAWN AEROBIC AND ANAEROBIC Blood Culture adequate volume   Culture   Final    NO GROWTH 5 DAYS Performed at [facility], [address], [place], [place] [address]    Report Status 04/20/2018 FINAL  Final  Culture, blood (Routine X 2) w Reflex to ID Panel     Status: None   Collection Time: 04/15/18  1:10 AM  Result Value Ref Range Status   Specimen Description BLOOD LEFT ANTECUBITAL  Final   Special Requests   Final    BOTTLES DRAWN AEROBIC AND ANAEROBIC Blood Culture adequate volume   Culture   Final    NO GROWTH 5 DAYS Performed at [facility], [place] [address] [place], [place] [address]    Report Status 04/20/2018 FINAL  Final  MRSA PCR Screening     Status: None   Collection Time: 04/15/18  6:58 AM  Result Value Ref Range Status   MRSA by PCR NEGATIVE NEGATIVE Final    Comment:        The GeneXpert MRSA Assay (FDA approved for NASAL specimens only), is one component of a comprehensive MRSA colonization surveillance program. It is not intended to diagnose MRSA infection nor to guide or monitor treatment for MRSA infections. Performed at [facility], [place] [address] [place], [place] [address]     FURTHER DISCHARGE INSTRUCTIONS:  Get Medicines reviewed and adjusted: Please take all your medications with you for your next visit with your Primary MD  Laboratory/radiological data: Please request your Primary MD to go over all hospital tests and procedure/radiological results at the follow up, please ask your Primary MD to get all Hospital records sent to his/her office.  In some cases, they will be blood work, cultures and biopsy results pending at the time of your discharge. Please request that your primary care M.D. goes through all the records of your hospital data and follows up on these results.  Also Note the following: If  you experience worsening of your admission symptoms, develop shortness of breath, life threatening emergency, suicidal or homicidal thoughts you must seek medical attention immediately by calling 911 or calling your MD immediately  if symptoms less severe.  You must read complete instructions/literature along with all the possible adverse reactions/side effects for all the Medicines you take and  that have been prescribed to you. Take any new Medicines after you have completely understood and accpet all the possible adverse reactions/side effects.   Do not drive when taking Pain medications or sleeping medications (Benzodaizepines)  Do not take more than prescribed Pain, Sleep and Anxiety Medications. It is not advisable to combine anxiety,sleep and pain medications without talking with your primary care practitioner  Special Instructions: If you have smoked or chewed Tobacco  in the last 2 yrs please stop smoking, stop any regular Alcohol  and or any Recreational drug use.  Wear Seat belts while driving.  Please note: You were cared for by a hospitalist during your hospital stay. Once you are discharged, your primary care physician will handle any further medical issues. Please note that NO REFILLS for any discharge medications will be authorized once you are discharged, as it is imperative that you return to your primary care physician (or establish a relationship with a primary care physician if you do not have one) for your post hospital discharge needs so that they can reassess your need for medications and monitor your lab values.  Total Time spent coordinating discharge including counseling, education and face to face time equals 25 minutes.  SignedOren Binet 04/24/2018 1:53 PM

## 2018-04-24 NOTE — Progress Notes (Signed)
Nutrition Brief Note  Chart reviewed. Pt now transitioning to comfort care.  No further nutrition interventions warranted at this time.  Please re-consult as needed.   Shamar Engelmann A. Marcel Gary, RD, LDN, CDE Pager: [phone] After hours Pager: [phone]  

## 2018-04-24 NOTE — Progress Notes (Signed)
[facility]ospice and [facility]) RN note  Notified CSW there is no availability today for [employer] at this time. [facility] liaison will follow up with CSW and family tomorrow or sooner if room becomes available.  Please do not hesitate to call with questions.  Thank you,  Venia Carbon BSN, RN [facility] Liaison  [phone]  [place]

## 2018-05-30 DEATH — deceased

## 2019-08-08 IMAGING — DX DG CHEST 1V PORT
1 series · 1 of 1 positions shown · non-contrast
Comparison: 12/08/2016

CLINICAL DATA: Fever this evening.

EXAM:
PORTABLE CHEST 1 VIEW

[chest ap]
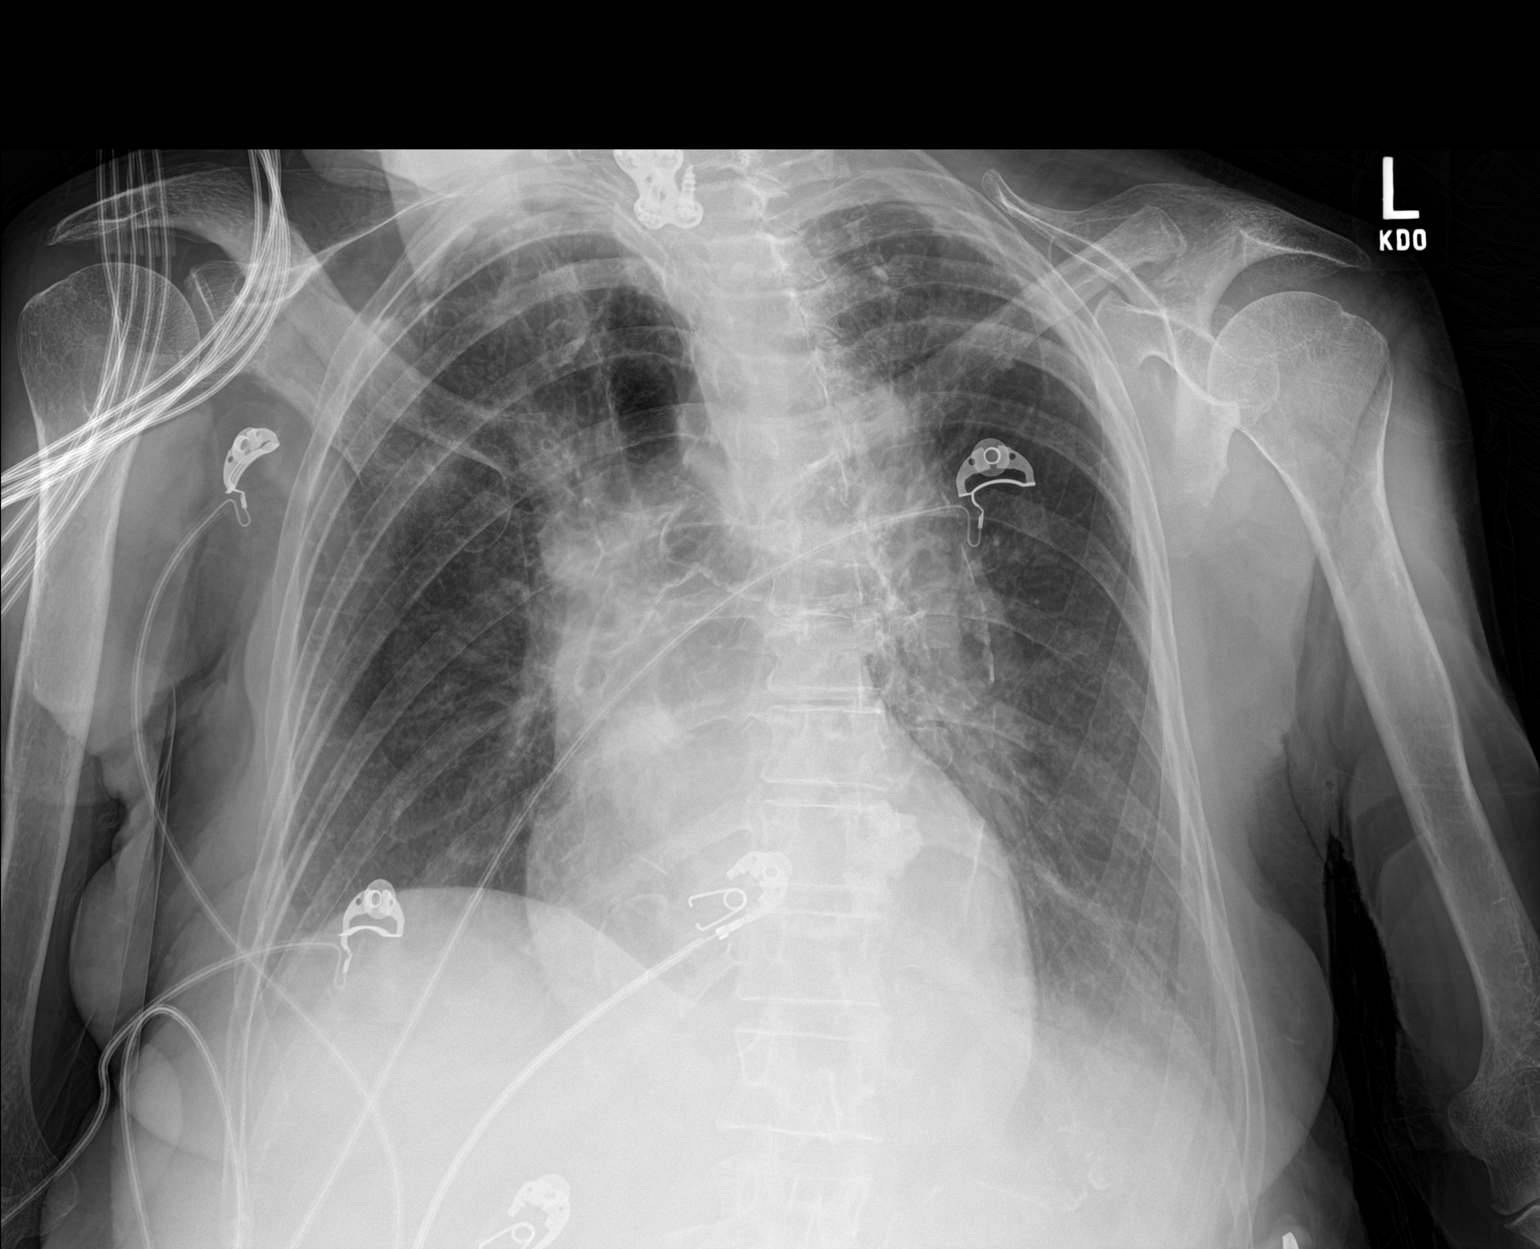

[1 of 1 positions shown; findings below may reference images not displayed]

FINDINGS: Examination is technically limited due to patient rotation. As
visualized, the heart size and pulmonary vascularity appear normal.
Probable emphysematous changes and scattered fibrosis in the lungs.
No focal airspace disease or consolidation is identified. No
blunting of costophrenic angles. No pneumothorax. Calcification of
the aorta. Postoperative changes in the cervical spine.
IMPRESSION: Probable emphysematous changes in the lungs. No evidence of active
pulmonary disease.
